# Patient Record
Sex: Male | Born: 1940
Health system: Southern US, Community
[De-identification: ages and names within clinical notes are randomized; demographics above are authoritative.]

## PROBLEM LIST (undated history)

## (undated) DIAGNOSIS — I2510 Atherosclerotic heart disease of native coronary artery without angina pectoris: Secondary | ICD-10-CM

## (undated) DIAGNOSIS — N529 Male erectile dysfunction, unspecified: Secondary | ICD-10-CM

## (undated) DIAGNOSIS — I209 Angina pectoris, unspecified: Secondary | ICD-10-CM

## (undated) DIAGNOSIS — K219 Gastro-esophageal reflux disease without esophagitis: Secondary | ICD-10-CM

## (undated) DIAGNOSIS — Z9889 Other specified postprocedural states: Secondary | ICD-10-CM

## (undated) DIAGNOSIS — E785 Hyperlipidemia, unspecified: Secondary | ICD-10-CM

## (undated) DIAGNOSIS — R0600 Dyspnea, unspecified: Secondary | ICD-10-CM

## (undated) DIAGNOSIS — I10 Essential (primary) hypertension: Secondary | ICD-10-CM

## (undated) DIAGNOSIS — M1990 Unspecified osteoarthritis, unspecified site: Secondary | ICD-10-CM

## (undated) HISTORY — DX: Atherosclerotic heart disease of native coronary artery without angina pectoris: I25.10

## (undated) HISTORY — DX: Gastro-esophageal reflux disease without esophagitis: K21.9

## (undated) HISTORY — DX: Hyperlipidemia, unspecified: E78.5

## (undated) HISTORY — DX: Other specified postprocedural states: Z98.890

## (undated) HISTORY — DX: Male erectile dysfunction, unspecified: N52.9

## (undated) HISTORY — PX: TONSILLECTOMY: SUR1361

## (undated) HISTORY — DX: Unspecified osteoarthritis, unspecified site: M19.90

---

## 1962-07-17 HISTORY — PX: APPENDECTOMY: SHX54

## 1968-07-17 HISTORY — PX: INGUINAL HERNIA REPAIR: SUR1180

## 2001-07-17 HISTORY — PX: RETINAL DETACHMENT SURGERY: SHX105

## 2002-11-25 ENCOUNTER — Ambulatory Visit (HOSPITAL_COMMUNITY): Admission: RE | Admit: 2002-11-25 | Discharge: 2002-11-26 | Payer: Self-pay | Admitting: Ophthalmology

## 2004-06-08 ENCOUNTER — Ambulatory Visit: Payer: Self-pay | Admitting: Family Medicine

## 2004-12-28 ENCOUNTER — Ambulatory Visit: Payer: Self-pay | Admitting: Family Medicine

## 2005-01-02 ENCOUNTER — Ambulatory Visit: Payer: Self-pay | Admitting: Family Medicine

## 2005-01-24 ENCOUNTER — Ambulatory Visit: Payer: Self-pay | Admitting: Family Medicine

## 2005-01-24 LAB — FECAL OCCULT BLOOD, GUAIAC: Fecal Occult Blood: NEGATIVE

## 2006-01-11 ENCOUNTER — Ambulatory Visit: Payer: Self-pay | Admitting: Internal Medicine

## 2006-01-11 LAB — CONVERTED CEMR LAB: PSA: 0.4 ng/mL

## 2006-02-05 ENCOUNTER — Ambulatory Visit: Payer: Self-pay | Admitting: Gastroenterology

## 2006-02-05 LAB — HM COLONOSCOPY

## 2006-02-12 ENCOUNTER — Ambulatory Visit: Payer: Self-pay | Admitting: Gastroenterology

## 2006-05-24 ENCOUNTER — Ambulatory Visit: Payer: Self-pay | Admitting: Internal Medicine

## 2007-02-04 ENCOUNTER — Ambulatory Visit: Payer: Self-pay | Admitting: Internal Medicine

## 2007-03-04 ENCOUNTER — Encounter: Payer: Self-pay | Admitting: Internal Medicine

## 2007-03-04 DIAGNOSIS — J309 Allergic rhinitis, unspecified: Secondary | ICD-10-CM | POA: Insufficient documentation

## 2007-03-04 DIAGNOSIS — E785 Hyperlipidemia, unspecified: Secondary | ICD-10-CM

## 2007-03-15 ENCOUNTER — Ambulatory Visit: Payer: Self-pay | Admitting: Internal Medicine

## 2007-03-15 DIAGNOSIS — M159 Polyosteoarthritis, unspecified: Secondary | ICD-10-CM

## 2007-03-19 LAB — CONVERTED CEMR LAB
ALT: 19 units/L (ref 0–53)
Albumin: 4.1 g/dL (ref 3.5–5.2)
BUN: 20 mg/dL (ref 6–23)
CO2: 30 meq/L (ref 19–32)
Chloride: 103 meq/L (ref 96–112)
Cholesterol: 189 mg/dL (ref 0–200)
Creatinine, Ser: 0.8 mg/dL (ref 0.4–1.5)
GFR calc non Af Amer: 102 mL/min
Phosphorus: 3.2 mg/dL (ref 2.3–4.6)
Total CHOL/HDL Ratio: 4.2
Triglycerides: 108 mg/dL (ref 0–149)

## 2007-09-11 ENCOUNTER — Ambulatory Visit: Payer: Self-pay | Admitting: Internal Medicine

## 2007-09-12 LAB — CONVERTED CEMR LAB
Cholesterol: 267 mg/dL (ref 0–200)
Direct LDL: 190.4 mg/dL
HDL: 45.3 mg/dL (ref >39.0)
Triglycerides: 171 mg/dL — ABNORMAL HIGH (ref 0–149)

## 2008-04-03 ENCOUNTER — Ambulatory Visit: Payer: Self-pay | Admitting: Internal Medicine

## 2008-07-03 ENCOUNTER — Ambulatory Visit: Payer: Self-pay | Admitting: Family Medicine

## 2008-07-24 ENCOUNTER — Telehealth: Payer: Self-pay | Admitting: Internal Medicine

## 2009-04-06 ENCOUNTER — Ambulatory Visit: Payer: Self-pay | Admitting: Internal Medicine

## 2009-04-08 LAB — CONVERTED CEMR LAB
ALT: 24 units/L (ref 0–53)
BUN: 18 mg/dL (ref 6–23)
Basophils Relative: 0.6 % (ref 0.0–3.0)
CO2: 31 meq/L (ref 19–32)
Calcium: 9.5 mg/dL (ref 8.4–10.5)
Chloride: 102 meq/L (ref 96–112)
Cholesterol: 208 mg/dL — ABNORMAL HIGH (ref 0–200)
Eosinophils Absolute: 0.1 10*3/uL (ref 0.0–0.7)
Eosinophils Relative: 1.4 % (ref 0.0–5.0)
HCT: 47.7 % (ref 39.0–52.0)
Lymphs Abs: 2.1 10*3/uL (ref 0.7–4.0)
MCHC: 33.9 g/dL (ref 30.0–36.0)
MCV: 91.5 fL (ref 78.0–100.0)
Monocytes Absolute: 0.6 10*3/uL (ref 0.1–1.0)
PSA: 0.53 ng/mL (ref 0.10–4.00)
RBC: 5.21 M/uL (ref 4.22–5.81)
Sodium: 140 meq/L (ref 135–145)
TSH: 1.15 microintl units/mL (ref 0.35–5.50)
Total Protein: 7 g/dL (ref 6.0–8.3)
Triglycerides: 194 mg/dL — ABNORMAL HIGH (ref 0.0–149.0)
WBC: 6.4 10*3/uL (ref 4.5–10.5)

## 2010-03-17 HISTORY — PX: CARDIAC CATHETERIZATION: SHX172

## 2010-04-06 ENCOUNTER — Encounter: Payer: Self-pay | Admitting: Internal Medicine

## 2010-04-06 ENCOUNTER — Ambulatory Visit: Payer: Self-pay | Admitting: Internal Medicine

## 2010-04-06 DIAGNOSIS — R079 Chest pain, unspecified: Secondary | ICD-10-CM

## 2010-04-06 DIAGNOSIS — R0989 Other specified symptoms and signs involving the circulatory and respiratory systems: Secondary | ICD-10-CM

## 2010-04-06 DIAGNOSIS — R0609 Other forms of dyspnea: Secondary | ICD-10-CM

## 2010-04-06 LAB — CONVERTED CEMR LAB
ALT: 19 units/L (ref 0–53)
AST: 24 units/L (ref 0–37)
Albumin: 4.4 g/dL (ref 3.5–5.2)
BUN: 20 mg/dL (ref 6–23)
Basophils Absolute: 0 10*3/uL (ref 0.0–0.1)
CO2: 29 meq/L (ref 19–32)
Calcium: 9.7 mg/dL (ref 8.4–10.5)
Chloride: 102 meq/L (ref 96–112)
GFR calc non Af Amer: 84.17 mL/min (ref >60)
HCT: 47.8 % (ref 39.0–52.0)
Hemoglobin: 16.4 g/dL (ref 13.0–17.0)
Lymphs Abs: 2.5 10*3/uL (ref 0.7–4.0)
MCHC: 34.4 g/dL (ref 30.0–36.0)
MCV: 90.2 fL (ref 78.0–100.0)
Monocytes Relative: 10.4 % (ref 3.0–12.0)
Neutro Abs: 4.6 10*3/uL (ref 1.4–7.7)
RDW: 13 % (ref 11.5–14.6)
TSH: 1.24 microintl units/mL (ref 0.35–5.50)

## 2010-04-08 ENCOUNTER — Ambulatory Visit: Payer: Self-pay | Admitting: Internal Medicine

## 2010-04-12 ENCOUNTER — Ambulatory Visit: Payer: Self-pay | Admitting: Cardiology

## 2010-04-12 ENCOUNTER — Inpatient Hospital Stay (HOSPITAL_BASED_OUTPATIENT_CLINIC_OR_DEPARTMENT_OTHER): Admission: RE | Admit: 2010-04-12 | Discharge: 2010-04-12 | Payer: Self-pay | Admitting: Cardiology

## 2010-04-19 ENCOUNTER — Ambulatory Visit: Payer: Self-pay | Admitting: Internal Medicine

## 2010-04-19 DIAGNOSIS — I25119 Atherosclerotic heart disease of native coronary artery with unspecified angina pectoris: Secondary | ICD-10-CM

## 2010-04-20 LAB — CONVERTED CEMR LAB
HDL: 48.7 mg/dL (ref >39.00)
PSA: 0.56 ng/mL (ref 0.10–4.00)
Triglycerides: 176 mg/dL — ABNORMAL HIGH (ref 0.0–149.0)

## 2010-05-06 ENCOUNTER — Ambulatory Visit: Payer: Self-pay | Admitting: Internal Medicine

## 2010-05-17 ENCOUNTER — Ambulatory Visit: Payer: Self-pay | Admitting: Internal Medicine

## 2010-05-18 LAB — CONVERTED CEMR LAB
Bilirubin, Direct: 0.1 mg/dL (ref 0.0–0.3)
LDL Cholesterol: 109 mg/dL — ABNORMAL HIGH (ref 0–99)
Total Bilirubin: 1 mg/dL (ref 0.3–1.2)
Total Protein: 6.3 g/dL (ref 6.0–8.3)
VLDL: 28.6 mg/dL (ref 0.0–40.0)

## 2010-05-23 ENCOUNTER — Telehealth: Payer: Self-pay | Admitting: Internal Medicine

## 2010-05-25 ENCOUNTER — Encounter: Payer: Self-pay | Admitting: Internal Medicine

## 2010-06-14 ENCOUNTER — Telehealth: Payer: Self-pay | Admitting: Internal Medicine

## 2010-08-04 ENCOUNTER — Ambulatory Visit
Admission: RE | Admit: 2010-08-04 | Discharge: 2010-08-04 | Payer: Self-pay | Source: Home / Self Care | Attending: Internal Medicine | Admitting: Internal Medicine

## 2010-08-04 ENCOUNTER — Encounter: Payer: Self-pay | Admitting: Internal Medicine

## 2010-08-10 ENCOUNTER — Telehealth: Payer: Self-pay | Admitting: Internal Medicine

## 2010-08-14 LAB — CONVERTED CEMR LAB
AST: 21 units/L (ref 0–37)
Albumin: 4.3 g/dL (ref 3.5–5.2)
Alkaline Phosphatase: 82 units/L (ref 39–117)
BUN: 17 mg/dL (ref 6–23)
Basophils Relative: 0.4 % (ref 0.0–3.0)
Basophils Relative: 0.7 % (ref 0.0–3.0)
Bilirubin, Direct: 0.2 mg/dL (ref 0.0–0.3)
CO2: 27 meq/L (ref 19–32)
Calcium: 9.2 mg/dL (ref 8.4–10.5)
Calcium: 9.5 mg/dL (ref 8.4–10.5)
Chloride: 108 meq/L (ref 96–112)
Creatinine, Ser: 1 mg/dL (ref 0.4–1.5)
Eosinophils Absolute: 0.1 10*3/uL (ref 0.0–0.7)
Eosinophils Relative: 1.2 % (ref 0.0–5.0)
Eosinophils Relative: 1.6 % (ref 0.0–5.0)
GFR calc Af Amer: 96 mL/min
GFR calc non Af Amer: 84.17 mL/min (ref >60)
HDL: 51.2 mg/dL (ref >39.0)
Hemoglobin: 16.4 g/dL (ref 13.0–17.0)
LDL Cholesterol: 91 mg/dL (ref 0–99)
Lymphocytes Relative: 33.2 % (ref 12.0–46.0)
Lymphocytes Relative: 33.5 % (ref 12.0–46.0)
Monocytes Relative: 10.5 % (ref 3.0–12.0)
Monocytes Relative: 10.8 % (ref 3.0–12.0)
Neutro Abs: 4 10*3/uL (ref 1.4–7.7)
Neutrophils Relative %: 54.3 % (ref 43.0–77.0)
Platelets: 232 10*3/uL (ref 150–400)
RBC: 5.17 M/uL (ref 4.22–5.81)
RBC: 5.25 M/uL (ref 4.22–5.81)
Sodium: 137 meq/L (ref 135–145)
Total CHOL/HDL Ratio: 3.3
Total Protein: 7.1 g/dL (ref 6.0–8.3)
VLDL: 25 mg/dL (ref 0–40)
WBC: 7.3 10*3/uL (ref 4.5–10.5)
aPTT: 27.7 s (ref 21.7–28.8)

## 2010-08-15 ENCOUNTER — Telehealth: Payer: Self-pay | Admitting: Internal Medicine

## 2010-08-16 NOTE — Letter (Signed)
Summary: Cardiac Catheterization Instructions- [facility]  [employer], Main Office  [address]   [place], [place] [address]   Phone: [phone]  Fax: [phone]     04/08/2010 MRN: [id number]  [facility] [address] [place], [place]  [address]  Dear Mr. Rayman,   You are scheduled for a Cardiac Catheterization on _9/27/2011________ with Dr.__McAlhany____________  Please arrive to the 1st floor of the [facility] at [facility] at __730___ am  on the day of your procedure. Please do not arrive before 6:30 a.m. Call the Heart and Vascular Center at [phone] if you are unable to make your appointmnet. The Code to get into the parking garage under the building is__0200______. Take the elevators to the 1st floor. You must have someone to drive you home. Someone must be with you for the first 24 hours after you arrive home. Please wear clothes that are easy to get on and off and wear slip-on shoes. Do not eat or drink after midnight except water with your medications that morning. Bring all your medications and current insurance cards with you.  ___ DO NOT take these medications before your procedure: ________________________________________________________________  ___ Make sure you take your aspirin.  __x_ You may take ALL of your medications with water that morning. ________________________________________________________________________________________________________________________________    ___________________________________________________________________________________________________  The usual length of stay after your procedure is 2 to 3 hours. This can vary.  If you have any questions, please call the office at the number listed above.   Layne Benton, RN, BSN

## 2010-08-16 NOTE — Miscellaneous (Signed)
  Clinical Lists Changes  Medications: Changed medication from NORVASC 2.5 MG TABS (AMLODIPINE BESYLATE) take one tablet by mouth daily to NORVASC 2.5 MG TABS (AMLODIPINE BESYLATE) take two  tablets  by mouth daily

## 2010-08-16 NOTE — Progress Notes (Signed)
Summary: calling about medication  Phone Note Call from Patient Call back at Home Phone [phone]   Caller: Patient Summary of Call: Pt calling about medication Initial call taken by: Judie Grieve,  June 14, 2010 2:21 PM  Follow-up for Phone Call        Called patient and he has advised me that he has less chest pain with exertion since increasing Norvasc to 5mg  every day. I advised him that I will let Dr. know that he is feeling better. He was instructed to call us back if symptoms change.  Layne Benton, RN, BSN  June 14, 2010 3:18 PM

## 2010-08-16 NOTE — Assessment & Plan Note (Signed)
Summary: new onset of classic excertinal angina/mt   Visit Type:  Initial Consult Primary :  Dr. Clemmie Krill  CC:  chest pain.  History of Present Illness: Patient is a 70 year old who is followed by R. Alphonsus Sias.  He was seen in clinic a couple days ago.  Relayed about a 3 wk history of chest tightnes/SOB with exertion (hills, elliptical)  Episodes went away with rest.  No tightness at rest.  No PND.  Current Medications (verified): 1)  Lovastatin 40 Mg  Tabs (Lovastatin) .... Take 1 Tablet By Mouth Once A Day  Allergies: 1)  ! Motrin Ib (Ibuprofen)  Past History:  Past Surgical History: Last updated: 04/07/2010 Appendectomy1964 Double hernia repair 1970 Carotid U/S wnl 11 /99 Colonoscopy 07/05/82 Right detached retina  ~2003  Family History: Last updated: 04-13-10 Father: Died at age 34, stomach cancer Mother: Died at the age of 44, after 4 bypasses and brain cancer that was metastatic from the lung Siblings: One sister still alive with diabetes CAD + mother (CABG in 53s) DM + sister Cancer + mother, father, GP Stroke + MGM  Social History: Last updated: 04/07/2010 Marital Status: Married Children: 2 Occupation: Retired - Teacher, English as a foreign language cigar--no cigarettes---stopped  ~6/09 Occ beer  Past Medical History: Chest pain Allergic rhinitis GERD Hyperlipidemia Duodenal ulcer Erectile dysfunction Osteoarthritis  Family History: Father: Died at age 79, stomach cancer Mother: Died at the age of 4, after 4 bypasses and brain cancer that was metastatic from the lung Siblings: One sister still alive with diabetes CAD + mother (CABG in 24s) DM + sister Cancer + mother, father, GP Stroke + MGM  Review of Systems       All systemrs reviewed.  Neg to above.  Vital Signs:  Patient profile:   70 year old male Height:      66.75 inches Weight:      185 pounds BMI:     29.30 Pulse rate:   60 / minute Pulse rhythm:   regular Resp:     18 per minute BP  sitting:   146 / 88  (left arm) Cuff size:   large  Vitals Entered By: Vikki Ports (04-13-2010 12:17 PM)  Physical Exam  Additional Exam:  Patient is in NAD HEENT:  Normocephalic, atraumatic. EOMI, PERRLA.  Neck: JVP is normal. No thyromegaly. No bruits.  Lungs: clear to auscultation. No rales no wheezes.  Heart: Regular rate and rhythm. Normal S1, S2. No S3.   No significant murmurs. PMI not displaced.  Abdomen:  Supple, nontender. Normal bowel sounds. No masses. No hepatomegaly.  Extremities:   Good distal pulses throughout. No lower extremity edema.  Musculoskeletal :moving all extremities.  Neuro:   alert and oriented x3.    EKG  Procedure date:  04/06/2010  Findings:      NSR.  57 bpm.  Impression & Recommendations:  Problem # 1:  CHEST PAIN (ICD-786.50)  History is concerning for angina.  Discussed with patient and wife.  With this I would recomm a LHC to define anatomy.  Continue on asa.  NTG as needed.  Labs today .  Plan for next wk.  No exercise or signif exertion prior.  Call if worsens,   would admit then.  His updated medication list for this problem includes:    Nitrostat 0.4 Mg Subl (Nitroglycerin) .Marland Kitchen... Take as directed  Problem # 2:  HYPERLIPIDEMIA (ICD-272.4)  ON statin  Will need t o be followed. His updated medication  list for this problem includes:    Lovastatin 40 Mg Tabs (Lovastatin) .Marland Kitchen... Take 1 tablet by mouth once a day  His updated medication list for this problem includes:    Lovastatin 40 Mg Tabs (Lovastatin) .Marland Kitchen... Take 1 tablet by mouth once a day  Other Orders: EKG w/ Interpretation (93000) TLB-BMP (Basic Metabolic Panel-BMET) (80048-METABOL) TLB-CBC Platelet - w/Differential (85025-CBCD) TLB-PT (Protime) (85610-PTP) TLB-PTT (85730-PTTL) Cardiac Catheterization (Cardiac Cath)  Patient Instructions: 1)  Your physician recommends that you return for lab work in:lab work today  2)  Your physician has requested that you have a  cardiac catheterization.  Cardiac catheterization is used to diagnose and/or treat various heart conditions. Doctors may recommend this procedure for a number of different reasons. The most common reason is to evaluate chest pain. Chest pain can be a symptom of coronary artery disease (CAD), and cardiac catheterization can show whether plaque is narrowing or blocking your heart's arteries. This procedure is also used to evaluate the valves, as well as measure the blood flow and oxygen levels in different parts of your heart.  For further information please visit [email or web address]  Please follow instruction sheet, as given. Prescriptions: NITROSTAT 0.4 MG SUBL (NITROGLYCERIN) take as directed  #25 x 1   Entered by:   Layne Benton, RN, BSN   Authorized by:   Sherrill Raring, MD, [facility]   Signed by:   Layne Benton, RN, BSN on 04/08/2010   Method used:   Electronically to        [facility]* (retail)       [address]       [place], [place]  [address]       Ph: [phone]       Fax: [phone]   RxID:   [id number]

## 2010-08-16 NOTE — Assessment & Plan Note (Signed)
Summary: CPX / LFW   Vital Signs:  Patient profile:   70 year old male Height:      66 inches Weight:      186 pounds Temp:     97.6 degrees F oral Pulse rate:   60 / minute Pulse rhythm:   regular BP sitting:   140 / 78  (left arm) Cuff size:   large  Vitals Entered By: Mervin Hack CMA Duncan Dull) (April 19, 2010 8:29 AM) CC: adult physical   History of Present Illness: Reviewed his recent cath---minor diffuse blockages Has again noted some dyspnea [place] [facility] [place] [employer]  Interested in shingles shot got flu shot today  No other new concerns  Allergies: 1)  ! Motrin Ib (Ibuprofen)  Past History:  Past medical, surgical, family and social histories (including risk factors) reviewed for relevance to current acute and chronic problems.  Past Medical History: Chest pain Allergic rhinitis GERD Hyperlipidemia Duodenal ulcer Erectile dysfunction Osteoarthritis Coronary artery disease  Past Surgical History: Appendectomy1964 Double hernia repair 1970 Carotid U/S wnl 11 /99 Colonoscopy 07/05/82 Right detached retina  ~2003 Cath 9/11   diffuse early disease  Family History: Reviewed history from 04/08/2010 and no changes required. Father: Died at age 66, stomach cancer Mother: Died at the age of 27, after 4 bypasses and brain cancer that was metastatic from the lung Siblings: One sister still alive with diabetes CAD + mother (CABG in 30s) DM + sister Cancer + mother, father, GP Stroke + MGM  Social History: Reviewed history from 04/07/2010 and no changes required. 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 Eyes:  Complains of vision loss-1 eye; denies double vision; Having some trouble  with vision Got new glasses but no improvement chronic vision loss in left eye (amblyopia). ENT:  Complains of ringing in ears; denies decreased hearing; rare tinnitus Full dentures. CV:  Complains of chest pain or discomfort, palpitations, and shortness of breath with exertion; denies difficulty breathing at night, difficulty breathing while lying down, fainting, and lightheadness; still with chest sensation, fluttering and dyspnea. Resp:  Denies cough, sputum productive, and wheezing. GI:  Denies abdominal pain, bloody stools, change in bowel habits, dark tarry stools, nausea, and vomiting; occ epigastric pain--?from spicy food. No meds. GU:  Denies urinary frequency and urinary hesitancy; no sex--no problem. MS:  Complains of joint pain; denies joint swelling; slight pain in left foot--usually at rest. No history of gout. Derm:  Denies lesion(s) and rash. Neuro:  Complains of headaches; denies numbness, tingling, and weakness; occ headaches---aleve relieves. Psych:  Denies anxiety and depression. Heme:  Denies abnormal bruising and enlarge lymph nodes. Allergy:  Denies seasonal allergies and sneezing.  Physical Exam  General:  alert and normal appearance.   Eyes:  pupils equal, pupils round, pupils reactive to light, and no optic disk abnormalities.   Ears:  R ear normal and L ear normal.   Mouth:  no erythema, no exudates, and no lesions.   Full upper and lower plates Neck:  supple, no masses, no thyromegaly, no carotid bruits, and no cervical lymphadenopathy.   Lungs:  normal respiratory effort, no intercostal retractions, no accessory muscle use, and normal breath sounds.   Heart:  normal rate, regular rhythm, no murmur,  and no gallop.  HR  ~60 Abdomen:  soft, non-tender, and no masses.   Msk:  no joint tenderness and no joint swelling.   Pulses:  1+ in feet Extremities:  no edema Neurologic:  alert & oriented X3, strength normal in all extremities, and gait normal.   Skin:  no  rashes and no suspicious lesions.   Axillary Nodes:  No palpable lymphadenopathy Psych:  normally interactive, good eye contact, not anxious appearing, and not depressed appearing.     Impression & Recommendations:  Problem # 1:  HEALTH MAINTENANCE EXAM (ICD-V70.0) Assessment Comment Only fairly active Rx for zostavax given will do PSA after discussion  Problem # 2:  CORONARY ARTERY DISEASE (ICD-414.00) Assessment: New diffuse, non obstructive disease still with a now stable anginal pattern BP okay without meds---- beta blocker probably could help but already HR only 60 ?long acting nitrate ?CCB He is due to see Dr Tenny Craw again  His updated medication list for this problem includes:    Nitrostat 0.4 Mg Subl (Nitroglycerin) .Marland Kitchen... Take as directed    Aspirin 81 Mg Tabs (Aspirin) .Marland Kitchen... 1 tab daily  Problem # 3:  HYPERLIPIDEMIA (ICD-272.4) Assessment: Comment Only  will change Rx if LDL not under 100  His updated medication list for this problem includes:    Lovastatin 40 Mg Tabs (Lovastatin) .Marland Kitchen... Take 1 tablet by mouth once a day  Labs Reviewed: SGOT: 24 (04/06/2010)   SGPT: 19 (04/06/2010)   HDL:49.40 (04/06/2009), 51.2 (04/03/2008)  LDL:91 (04/03/2008), DEL (04/54/0981)  Chol:208 (04/06/2009), 167 (04/03/2008)  Trig:194.0 (04/06/2009), 123 (04/03/2008)  Orders: TLB-Lipid Panel (80061-LIPID) Venipuncture (19147)  Complete Medication List: 1)  Lovastatin 40 Mg Tabs (Lovastatin) .... Take 1 tablet by mouth once a day 2)  Nitrostat 0.4 Mg Subl (Nitroglycerin) .... Take as directed 3)  Aspirin 81 Mg Tabs (Aspirin) .Marland Kitchen.. 1 tab daily  Other Orders: Flu Vaccine 64yrs + MEDICARE PATIENTS (W2956) Administration Flu vaccine - MCR (G0008) TLB-PSA (Prostate Specific Antigen) (84153-PSA)  Patient Instructions: 1)  Please schedule a follow-up appointment in 6 months .   Current Allergies (reviewed today): ! MOTRIN IB (IBUPROFEN)   Influenza Vaccine    Vaccine Type: Fluvax  MCR    Site: left deltoid    Mfr: [employer] P[employer]    Dose: 0.5 ml    Route: IM    Given by: Mervin Hack CMA (AAMA)    Exp. Date: 01/14/2011    Lot #: [id number]    VIS given: 02/08/10 version given April 19, 2010.  Flu Vaccine Consent Questions    Do you have a history of severe allergic reactions to this vaccine? no    Any prior history of allergic reactions to egg and/or gelatin? no    Do you have a sensitivity to the preservative Thimersol? no    Do you have a past history of Guillan-Barre Syndrome? no    Do you currently have an acute febrile illness? no    Have you ever had a severe reaction to latex? no    Vaccine information given and explained to patient? yes

## 2010-08-16 NOTE — Progress Notes (Signed)
Summary: re update on condition  Phone Note Call from Patient Call back at [phone]   Caller: Patient [phone] Reason for Call: Talk to Nurse Summary of Call: PT CALLING WITH UPDATE ON CONDITION Initial call taken by: Glynda Jaeger,  May 23, 2010 12:16 PM  Follow-up for Phone Call        Patient advised me that he has been taking Norvasc 2.5 mg and does not see any change in his status. He states that he still has chest pain with exertion but the pain is relieved with rest. I advised him that I would discuss the above with Dr.Ross and call him back. I advised him that she would probably either increase the  Norvasc medication or start a new medication. He states that he hopes he stays on Norvasc because he purchased a 3 month supply. Will follow up after Dr.Ross reviews.   Layne Benton, RN, BSN  May 23, 2010 2:13 PM     Additional Follow-up for Phone Call Additional follow up Details #1::        He can increase to 5 mg per day.  If too much can have some dizziness.  Follow how feels. (take 2 2.5 mg per day).  Call in a couple wks. Additional Follow-up by: Sherrill Raring, MD, [facility],  May 23, 2010 9:15 PM     Appended Document: re update on condition Pt.given instructions from Dr. Tenny Craw. He verbalizes understanding of all instructions.

## 2010-08-16 NOTE — Assessment & Plan Note (Signed)
Summary: SHORTNESS OF BREATH WITH EXERTION   Vital Signs:  Patient profile:   70 year old male Weight:      188 pounds BMI:     29.77 Temp:     97.8 degrees F oral Pulse rate:   58 / minute Pulse rhythm:   regular BP sitting:   160 / 80  (left arm) Cuff size:   large  Vitals Entered By: Mervin Hack CMA Duncan Dull) (April 06, 2010 9:19 AM) CC: shortness of breath   History of Present Illness: Has been walking dog in the morning Going up a grade he is getting tightness in chest and has had to stop to get his breath then he can go on started a few weeks ago  Continues to work out at [employer] doing [employer] and other [employer] got tight in his chest and heaviness in arms with elliptical and had to stop  Sleeps poorly. In and out of sleep --no sig change though No orthopnea or PND No edema  Allergies: 1)  ! Motrin Ib (Ibuprofen)  Past History:  Past medical, surgical, family and social histories (including risk factors) reviewed for relevance to current acute and chronic problems.  Past Medical History: Reviewed history from 03/15/2007 and no changes required. Allergic rhinitis GERD Hyperlipidemia Duodenal ulcer Erectile dysfunction Osteoarthritis  Past Surgical History: Reviewed history from 03/04/2007 and no changes required. [id number] Double hernia repair 1970 Carotid U/S wnl 11 /99 Colonoscopy 07/05/82 Right detached retina  ~2003  Family History: Reviewed history from 03/04/2007 and no changes required. Father: Died at age 63, stomach cancer Mother: Died at the age of 44, after 4 bypasses and brain cancer that was metastatic from the lung Siblings: One sister still alive with diabetes CAD + mother DM + sister Cancer + mother, father, GP Stroke + MGM  Social History: Reviewed history from 04/03/2008 and no changes required. 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   Neck:  supple, no masses, no thyromegaly, no carotid bruits, and no cervical lymphadenopathy.   Lungs:  normal respiratory effort, no intercostal retractions, no accessory muscle use, and normal breath sounds.   Heart:  normal rate, regular rhythm, no murmur, no gallop, and no rub.   Abdomen:  soft, non-tender, and no masses.   Pulses:  1+ in feet Extremities:  no edema Psych:  normally interactive, good eye contact, not depressed appearing, and slightly anxious.     Impression & Recommendations:  Problem # 1:  CHEST PAIN (ICD-786.50) Assessment New classic story of exertional angina may be appropriate to proceed right to cath----will set up appt with cardiology start ASA take it easy for now  Orders: EKG w/ Interpretation (93000) TLB-Renal Function Panel (80069-RENAL) TLB-CBC Platelet - w/Differential (85025-CBCD) TLB-Hepatic/Liver Function Pnl (80076-HEPATIC) TLB-TSH (Thyroid Stimulating Hormone) (84443-TSH) Venipuncture (86578) Cardiology Referral (Cardiology)  Problem # 2:  DYSPNEA/SHORTNESS OF BREATH (ICD-786.09) Assessment: Comment Only has lung disease on the spirometry but not enough to account for his symptoms no longer smokes cigars CXR benign--?mild increased interstitial markings  Orders: Spirometry w/Graph (94010) T-2 View CXR (71020TC)  Complete Medication List: 1)  Lovastatin 40 Mg Tabs (Lovastatin) .... Take 1 tablet by mouth once a day  Patient Instructions: 1)  Please plan to keep physical appt 2)  Please start coated aspirin 81mg  daily. Take your first dose today 3)  Referral Appointment Information 4)  Day/Date: 5)  Time: 6)  Place/MD: 7)  Address: 8)  Phone/Fax: 9)  Patient given appointment information. Information/Orders faxed/mailed.  Current Allergies (reviewed today): ! MOTRIN IB (IBUPROFEN)

## 2010-08-16 NOTE — Cardiovascular Report (Signed)
Summary: Pre Cath Orders   Pre Cath Orders   Imported By: Roderic Ovens 04/14/2010 16:21:49  _____________________________________________________________________  External Attachment:    Type:   Image     Comment:   External Document

## 2010-08-16 NOTE — Assessment & Plan Note (Signed)
Summary: eph/ post cath.g d   Visit Type:  Post-hospital Primary Provider:  Dr. Clemmie Krill  CC:  Sob.  History of Present Illness: Jonathan Griffith is a 70 year old who is followed by R. Alphonsus Sias. I saw him in clinic in Sept for SOB and chest tightness, worrisome for ischemia.  The Jonathan Griffith was set up for R/L heart cath.  R heart cath showed normal pressuress.  L heart catho showed 30 to 40% LAD prox; 50% distal LAD.  RCA and CX were without signif narrowings.  Of note the vessels were noted to be small caliber.  It was felt that they may be diffusely diseased but without discrete blockages.  Possible microvasc disease..  Since seen he continues to get chest pressure with walking.  No real change.  Current Medications (verified): 1)  Nitrostat 0.4 Mg Subl (Nitroglycerin) .... Take As Directed 2)  Aspirin 81 Mg Tabs (Aspirin) .Marland Kitchen.. 1 Tab Daily 3)  Pravastatin Sodium 40 Mg Tabs (Pravastatin Sodium) .... Take 1 By Mouth Once Daily  Allergies: 1)  ! Motrin Ib (Ibuprofen)  Past History:  Past medical, surgical, family and social histories (including risk factors) reviewed, and no changes noted (except as noted below).  Past Medical History: Reviewed history from 04/19/2010 and no changes required. Chest pain Allergic rhinitis GERD Hyperlipidemia Duodenal ulcer Erectile dysfunction Osteoarthritis Coronary artery disease  Past Surgical History: Reviewed history from 04/19/2010 and no changes required. [id number] Double hernia repair 1970 Carotid U/S wnl 11 /99 Colonoscopy 07/05/82 Right detached retina  ~2003 Cath 9/11   diffuse early disease  Family History: Reviewed history from 04/08/2010 and no changes required. Father: Died at age 22, stomach cancer Mother: Died at the age of 12, after 4 bypasses and brain cancer that was metastatic from the lung Siblings: One sister still alive with diabetes CAD + mother (CABG in 50s) DM + sister Cancer + mother, father, GP Stroke +  MGM  Social History: Reviewed history from 04/07/2010 and no changes required. 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 HEENT:  Normocephalic, atraumatic. EOMI, PERRLA.  Neck: JVP is normal. No thyromegaly. No bruits.  Lungs: clear to auscultation. No rales no wheezes.  Heart: Regular rate and rhythm. Normal S1, S2. No S3.   No significant murmurs. PMI not displaced.  Abdomen:  Supple, nontender. Normal bowel sounds. No masses. No hepatomegaly.  Extremities:   Good distal pulses throughout. No lower extremity edema.  Musculoskeletal :moving all extremities.  Neuro:   alert and oriented x3.    EKG  Procedure date:  05/06/2010  Findings:      Sinus bradycardia.  57 bpm.  First degree AV block.  PR 248 msec.  Impression & Recommendations:  Problem # 1:  CORONARY ARTERY DISEASE (ICD-414.00) No severe discrete narrowings on recent cath.  Cath is suspicious for diffuse disease.  Also question microvascular disease. I would recommend a trial of Norvasc  2.5 mg.  F/U in clinic.  Problem # 2:  HYPERLIPIDEMIA (ICD-272.4) will need to follow. His updated medication list for this problem includes:    Pravastatin Sodium 40 Mg Tabs (Pravastatin sodium) .Marland Kitchen... Take 1 by mouth once daily  Other Orders: EKG w/ Interpretation (93000)  Jonathan Griffith Instructions: 1)  Your physician recommends that you schedule a follow-up appointment in: 3 mopnths. 2)  Your physician has recommended you make the following change in your medication: Norvasc 2.5 mg  once a day. 3)  Pt. will call Annice Pih in 2 weeks to let her know how he feels taken the new medication. Prescriptions: NORVASC 2.5 MG TABS (AMLODIPINE BESYLATE) take one tablet by mouth daily  #90 x 3   Entered by:   Ollen Gross, RN, BSN   Authorized by:   Sherrill Raring, MD, [facility]   Signed by:   Ollen Gross, RN, BSN on 05/06/2010   Method used:   Electronically to        [facility] [phone]* (retail)       [address]       [place], [place]  [address]       Ph: [phone]       Fax: [phone]   RxID:   [id number] NORVASC 2.5 MG TABS (AMLODIPINE BESYLATE) tahke one tablet by mouth daily  #30 x 6   Entered by:   Ollen Gross, RN, BSN   Authorized by:   Sherrill Raring, MD, [facility]   Signed by:   Ollen Gross, RN, BSN on 05/06/2010   Method used:   Electronically to        [facility] [phone]* (retail)       [address]       [place], [place]  [address]       Ph: [phone]       Fax: [phone]   RxID:   [id number]

## 2010-08-18 NOTE — Progress Notes (Addendum)
Summary: pt requests phone call  Phone Note Call from Patient Call back at [phone]   Caller: Patient Call For: Cindee Salt MD Summary of Call: Pt is not happy with Dr. Tenny Craw.  He says she is a part time doctor and difficult to get in touch with.  She put him on imdur and this is giving him headaches.  He doesnt want to take it, but he cant get in touch with Dr. Tenny Craw or her nurse for them to advise what he should do.  He is asking that you call him to discuss. Initial call taken by: Lowella Petties CMA, AAMA,  August 10, 2010 8:25 AM  Follow-up for Phone Call        still with exertional chest pain Got amlodipine --didn't seem to help  tried imdur----not clearly helpful but really had bad persistent headache  Despite low HR, I will try low dose bisoprolol for angina Advised to watch for dizziness or fatigue  I will follow up at his 3/4 visit unless he can't tolerate the med Follow-up by: Cindee Salt MD,  August 10, 2010 2:14 PM    New/Updated Medications: BISOPROLOL FUMARATE 5 MG TABS (BISOPROLOL FUMARATE) 1/2 tab daily for exertional chest pain Prescriptions: BISOPROLOL FUMARATE 5 MG TABS (BISOPROLOL FUMARATE) 1/2 tab daily for exertional chest pain  #30 x 3   Entered and Authorized by:   Cindee Salt MD   Signed by:   Cindee Salt MD on 08/10/2010   Method used:   Electronically to        [facility]* (retail)       [address]       [place], [place]  [address]       Ph: [phone]       Fax: [phone]   RxID:   [id number]   Appended Document: pt requests phone call Reviewed.  Left msg on phone machine that I would call back today/tomorrow.  Appended Document: pt requests phone call Left msg yesterday.  Left msg today/  I told him to call back.  I will be in clinic on Monday.

## 2010-08-18 NOTE — Assessment & Plan Note (Signed)
Summary: 3 month rov/sl   Primary Provider:  Dr. Clemmie Krill  CC:  sob.Marland KitchenMarland Kitchenpt has not taken  norvasc for about a month now .  History of Present Illness: Patient is a 70 year old with a history of CP and SOB.  I saw him last fall for evaluation  R heart cath showed normal pulm pressuress.  L heart cath showed mild disease.  The vessels did appear small though, suspicous for diffuse atherosclerosis.  I saw the patient in clinic after the procedure.  I prescribed 2.5 Norvasc to see if it would provide any vasodilation   The patient did not notice any difference with this.  He ran out and did not have it refilled.   HE still gets chest tightness with activity (walking on treadmll, biking, walking up hill).  Relieved with rest or slowing down.  No PND.  Current Medications (verified): 1)  Nitrostat 0.4 Mg Subl (Nitroglycerin) .... Take As Directed 2)  Aspirin 81 Mg Tabs (Aspirin) .Marland Kitchen.. 1 Tab Daily 3)  Pravastatin Sodium 40 Mg Tabs (Pravastatin Sodium) .... Take 1 By Mouth Once Daily  Allergies: 1)  ! 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  Past Medical History: Reviewed history from 04/19/2010 and no changes required. Chest pain Allergic rhinitis GERD Hyperlipidemia Duodenal ulcer Erectile dysfunction Osteoarthritis Coronary artery disease  Past Surgical History: Reviewed history  from 04/19/2010 and no changes required. [id number] Double hernia repair 1970 Carotid U/S wnl 11 /99 Colonoscopy 07/05/82 Right detached retina  ~2003 Cath 9/11   diffuse early disease  Family History: Reviewed history from Apr 10, 2010 and no changes required. Father: Died at age 28, stomach cancer Mother: Died at the age of 69, after 4 bypasses and brain cancer that was metastatic from the lung Siblings: One sister still alive with diabetes CAD + mother (CABG in 95s) DM + sister Cancer + mother, father, GP Stroke + MGM  Social History: Reviewed history from 04/07/2010 and no changes required. 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  Neg to the above problem except as noted above  Vital Signs:  Patient profile:   70 year old male Height:      66 inches Weight:      193 pounds BMI:     31.26 Pulse rate:   55 / minute Resp:     16 per minute BP sitting:   147 / 79  (left arm)  Vitals Entered By: Kem Parkinson (August 04, 2010 8:24 AM)  Physical Exam  Additional Exam:  Patient is in NAD HEENT:  Normocephalic, atraumatic. EOMI, PERRLA.  Neck: JVP is normal. No thyromegaly. No bruits.  Lungs: clear to auscultation. No rales no wheezes.  Heart: Regular rate and rhythm. Normal S1, S2. No S3.   No significant murmurs. PMI not displaced.  Abdomen:  Supple, nontender. Normal bowel sounds. No masses. No hepatomegaly.  Extremities:   Good distal pulses throughout. No lower extremity edema.  Musculoskeletal :moving all extremities.  Neuro:   alert and oriented x3.    EKG  Procedure date:  08/04/2010  Findings:      NSR.  74 bpm.  First degree AV block.    Impression & Recommendations:  Problem # 1:  CORONARY ARTERY DISEASE (ICD-414.00) patient still with symptoms.   I would recomm a trial of Imdur  (first 30 then 60 mg per day.)  Call in a  few wks to see how he is doing. The following medications were removed from the medication list:    Norvasc 2.5 Mg Tabs (Amlodipine besylate) .Marland Kitchen... Take two  tablets  by mouth daily His updated medication list for this problem includes:    Nitrostat 0.4 Mg Subl (Nitroglycerin) .Marland Kitchen... Take as directed    Aspirin 81 Mg Tabs (Aspirin) .Marland Kitchen... 1 tab daily    Isosorbide Mononitrate Cr 60 Mg Xr24h-tab (Isosorbide mononitrate) .Marland Kitchen... 1 tab every day  Orders: EKG w/ Interpretation (93000)  Problem # 2:  HYPERLIPIDEMIA (ICD-272.4) Patient's last LDL was 109 .  Now on pravastatin 40  He is due to have fasting lipid panel in March.  I will review results.  With cath findings suggest aggressive control of LDL (70s) His updated medication list for this problem includes:    Pravastatin Sodium 40 Mg Tabs (Pravastatin sodium) .Marland Kitchen... Take 1 by mouth once daily  Patient Instructions: 1)  Your physician has recommended you make the following change in your medication: new medication Imdur. Take one half a tab for 3 days then 1 tab every day  2)  Your physician wants you to follow-up in:  August 2012  You will receive a reminder letter in the mail two months in advance. If you don't receive a letter, please call our office to schedule the follow-up appointment. Prescriptions: ISOSORBIDE MONONITRATE CR 60 MG XR24H-TAB (ISOSORBIDE MONONITRATE) 1 tab every day  #32 x 6   Entered by:   Layne Benton, RN, BSN   Authorized by:   Sherrill Raring, MD, [facility]   Signed by:   Layne Benton, RN, BSN on 08/04/2010   Method used:   Electronically to        [facility] [phone]* (retail)       [address]       [place], [place]  [address]       Ph: [phone]       Fax: [phone]   RxID:   [id number]

## 2010-08-27 ENCOUNTER — Encounter: Payer: Self-pay | Admitting: Internal Medicine

## 2010-09-01 NOTE — Progress Notes (Addendum)
Summary: rtn call to dr Tenny Craw  Phone Note Call from Patient   Caller: Patient [phone] Reason for Call: Talk to Nurse, Talk to Doctor Summary of Call: pt rtn call to dr Tenny Craw Initial call taken by: Glynda Jaeger,  August 15, 2010 8:13 AM  Follow-up for Phone Call        Called patient back. He advised me that he could not tolerate the Imdur because of severe headaches not helped by Aleve. His primary care doctor started him on Bisoprolol 2.5 mg every day. He states that he is tolerating the medication and thinks that it is helping. He feels that he has less chest pain with ambulation. Advised will let Dr. know about this update.  Layne Benton, RN, BSN  August 17, 2010 6:48 PM   Additional Follow-up for Phone Call Additional follow up Details #1::        aware of response. Need to make sure patient has f/u in cardiology this summer.  Can be with other cardiologist if he would prefer.  Primary is R. Letvak. Additional Follow-up by: Sherrill Raring, MD, [facility],  August 25, 2010 10:39 AM     Appended Document: rtn call to dr Tenny Craw He is in call backs for August 2012 for cardiology follow up.

## 2010-09-29 LAB — POCT I-STAT 3, VENOUS BLOOD GAS (G3P V)
O2 Saturation: 72 %
pCO2, Ven: 47.5 mmHg (ref 45.0–50.0)
pH, Ven: 7.368 — ABNORMAL HIGH (ref 7.250–7.300)

## 2010-09-29 LAB — POCT I-STAT 3, ART BLOOD GAS (G3+)
Bicarbonate: 27.9 mEq/L — ABNORMAL HIGH (ref 20.0–24.0)
pCO2 arterial: 41.6 mmHg (ref 35.0–45.0)
pH, Arterial: 7.435 (ref 7.350–7.450)
pO2, Arterial: 75 mmHg — ABNORMAL LOW (ref 80.0–100.0)

## 2010-10-19 ENCOUNTER — Encounter: Payer: Self-pay | Admitting: Internal Medicine

## 2010-10-19 ENCOUNTER — Ambulatory Visit (INDEPENDENT_AMBULATORY_CARE_PROVIDER_SITE_OTHER): Payer: MEDICARE | Admitting: Internal Medicine

## 2010-10-19 VITALS — BP 138/80 | HR 56 | Temp 97.6°F | Ht 66.0 in | Wt 187.0 lb

## 2010-10-19 DIAGNOSIS — E785 Hyperlipidemia, unspecified: Secondary | ICD-10-CM

## 2010-10-19 DIAGNOSIS — I2510 Atherosclerotic heart disease of native coronary artery without angina pectoris: Secondary | ICD-10-CM

## 2010-10-19 DIAGNOSIS — M1990 Unspecified osteoarthritis, unspecified site: Secondary | ICD-10-CM

## 2010-10-19 LAB — HEPATIC FUNCTION PANEL
ALT: 19 U/L (ref 0–53)
Alkaline Phosphatase: 78 U/L (ref 39–117)
Bilirubin, Direct: 0.2 mg/dL (ref 0.0–0.3)
Total Protein: 6.6 g/dL (ref 6.0–8.3)

## 2010-10-19 LAB — BASIC METABOLIC PANEL
CO2: 30 mEq/L (ref 19–32)
Chloride: 102 mEq/L (ref 96–112)
Creatinine, Ser: 0.9 mg/dL (ref 0.4–1.5)
Glucose, Bld: 91 mg/dL (ref 70–99)

## 2010-10-19 LAB — LIPID PANEL
Total CHOL/HDL Ratio: 4
Triglycerides: 141 mg/dL (ref 0.0–149.0)

## 2010-10-19 MED ORDER — BISOPROLOL FUMARATE 5 MG PO TABS: 2.5000 mg | ORAL_TABLET | Freq: Every day | ORAL | Status: DC

## 2010-10-19 NOTE — Progress Notes (Signed)
  Subjective:    Patient ID: Jonathan Griffith, male    DOB: 02/14/1941, 70 y.o.   MRN: [id number]  HPI Feels better Has been tolerating the bisoprolol in very low dose Exercise tolerance has improved Able to walk up the hill with dog easier----still gets some tightness if he pushes it Regular at Y--may have some tightness if he pushes on the treadmill No problems on the bicycle Also does yard work daily  No myalgias on statin Did have brief pain in right leg---resolved after a couple of day No stomach problems  Some hand pain Uses aleve a couple of times a month---helps  Past Medical History  Diagnosis Date  . Chest pain   . Allergic rhinitis   . GERD (gastroesophageal reflux disease)   . Hyperlipidemia   . Duodenal ulcer   . ED (erectile dysfunction)   . Osteoarthritis   . CAD (coronary artery disease)     Past Surgical History  Procedure Date  . Appendectomy 1964  . Hernia repair 1970    double hernia repair   . Retinal detachment surgery 2003    Family History  Problem Relation Age of Onset  . Cancer Mother     brain cancer  that was metastatic  from lung   . Coronary artery disease Mother     CABG in 21's  . Cancer Father     stomach cancer- 79  . Diabetes Sister   . Stroke Maternal Grandmother     History   Social History  . Marital Status: Married    Spouse Name: N/A    Number of Children: N/A  . Years of Education: N/A   Occupational History  . Not on file.   Social History Main Topics  . Smoking status: Former Smoker    Types: Cigars    Quit date: 07/18/2007  . Smokeless tobacco: Never Used   Comment: Occ cigar - no cigarettes - stopped in 6-09  . Alcohol Use: Yes     occ beer  . Drug Use: Not on file  . Sexually Active: Not on file   Other Topics Concern  . Not on file   Social History Narrative  . No narrative on file   Review of Systems Occ mild headaches Appetite is okay---definitely down from the past Weight is stable Sleep  still isn't great--freq awakening. Some daytime fatigue--occ naps and that helps    Objective:   Physical Exam  Constitutional: He appears well-developed and well-nourished. No distress.  Neck: Normal range of motion. Neck supple. No thyromegaly present.  Cardiovascular: Regular rhythm, normal heart sounds and intact distal pulses.  Exam reveals no gallop.   No murmur heard.      Bradycardic Normal pedal pulses  Pulmonary/Chest: Effort normal and breath sounds normal. No respiratory distress. He has no wheezes. He has no rales.  Abdominal: Soft. He exhibits no mass. There is no tenderness.  Musculoskeletal: Normal range of motion. He exhibits no edema and no tenderness.  Lymphadenopathy:    He has no cervical adenopathy.  Psychiatric: He has a normal mood and affect. His behavior is normal. Judgment and thought content normal.          Assessment & Plan:

## 2010-12-02 NOTE — Op Note (Signed)
NAMERANFERI, CLINGAN NO.:  [id number]   MEDICAL RECORD NO.:  [id number]                   PATIENT TYPE:  OIB   LOCATION:  2899                                 FACILITY:  [facility]   PHYSICIAN:  Beulah Gandy. Ashley Royalty, M.D.              DATE OF BIRTH:  Nov 26, 1940   DATE OF PROCEDURE:  11/25/2002  DATE OF DISCHARGE:                                 OPERATIVE REPORT   ADMISSION DIAGNOSIS:  Rhegmatogenous retinal detachment right eye.   PROCEDURE:  Scleral buckle right eye, retinal photocoagulation right eye,  subretinal fluid drainage right eye.   SURGEON:  Beulah Gandy. Ashley Royalty, M.D.   ASSISTANT:  Merian Capron, M.A.   ANESTHESIA:  General.   DESCRIPTION OF PROCEDURE:  Usual prep and drape.  360 degree limbal  peritomy. Isolation of four rectus muscles on 2-0 silk.  Localization of  breaks at 9 o'clock. Scleral dissection from 7 o'clock to 11 o'clock to  admit a #279 intrascleral implant with 2 mm trimmed from the posterior edge.  The diathermy was placed in the bed and the buckle was placed against the  diathermized bed. A total of three scleral sutures were placed in the  scleral flaps.  Three perforations, one at 8, one at 9, and one at 10,  obtained a thick, clear, colorless subretinal fluid.  The scleral flaps were  drawn securely. A 240 band was placed around the eye with a belt loop at 1,  3, and 5.  The band was attached to itself with a 270 sleeve at 2 o'clock.  Proper indentation of the globe was performed with shortening of the band.  The indirect ophthalmoscopy showed the retina to be lying nicely on the  scleral buckle with some areas of thick subretinal fluid remaining.  The  indirect ophthalmoscope laser was moved into place and 631 burns were placed  around the retinal breaks and just posterior to the area of thick subretinal  fluid formation mostly along the buckle. The power was between 400 and 600  milliwatts, 1000 microns each, and 0.1 to  0.05 seconds each.  The band was  adjusted and trimmed, the buckle was adjusted and trimmed, sutures were  adjusted and trimmed. The conjunctiva was reposited with 7-0 chromic suture.  Polymixin and gentamicin were irrigated into tenon's space. Atropine  solution was applied. Decadron 10 mg was injected into the lower  subconjunctival space. Marcaine was injected around the globe for  postoperative pain. Polysporin, a patch, and shield were placed.  Paracentesis x1 obtained a closing tension of 10 with a Barraquer tonometer.  Polysporin, a patch, and shield were placed. The patient was awakened and  taken to the recovery room in satisfactory condition.  Beulah Gandy. Ashley Royalty, M.D.    JDM/MEDQ  D:  11/25/2002  T:  11/26/2002  Job:  [id number]

## 2011-01-09 ENCOUNTER — Encounter: Payer: Self-pay | Admitting: Internal Medicine

## 2011-02-15 ENCOUNTER — Encounter: Payer: Self-pay | Admitting: Internal Medicine

## 2011-02-24 ENCOUNTER — Telehealth: Payer: Self-pay | Admitting: Internal Medicine

## 2011-02-24 ENCOUNTER — Encounter: Payer: Self-pay | Admitting: Internal Medicine

## 2011-02-24 ENCOUNTER — Ambulatory Visit (INDEPENDENT_AMBULATORY_CARE_PROVIDER_SITE_OTHER): Payer: MEDICARE | Admitting: Internal Medicine

## 2011-02-24 VITALS — BP 106/64 | HR 46 | Ht 66.0 in | Wt 186.0 lb

## 2011-02-24 DIAGNOSIS — I2510 Atherosclerotic heart disease of native coronary artery without angina pectoris: Secondary | ICD-10-CM

## 2011-02-24 DIAGNOSIS — E785 Hyperlipidemia, unspecified: Secondary | ICD-10-CM

## 2011-02-24 DIAGNOSIS — E782 Mixed hyperlipidemia: Secondary | ICD-10-CM

## 2011-02-24 MED ORDER — SIMVASTATIN 40 MG PO TABS
40.0000 mg | ORAL_TABLET | Freq: Every evening | ORAL | Status: DC
Start: 2011-02-24 — End: 2011-11-29

## 2011-02-24 NOTE — Telephone Encounter (Signed)
Pt wanted the nurse to know, he has receive her message and everything is fine with him.

## 2011-02-24 NOTE — Telephone Encounter (Signed)
LMOM that medication I sent in was for Simvastatin to [facility]. I asked him to call back to make sure he understands.

## 2011-02-24 NOTE — Assessment & Plan Note (Signed)
Patient remains active though he says he gives out.  On EKG today his heart rate is low.  It was in the 50s on last clinic visit to Dr. Alphonsus Sias I have asked him to cut his Bisoprolol into 1/4 tab.  Call on Monday to say how he feels Beyond this could consider trial of Ranexa.  I would not push towards this. Again, on R heart cath pulm pressures were normal.

## 2011-02-24 NOTE — Telephone Encounter (Signed)
Pt was seen by Dr. Tenny Craw and a medication was ordered Pravastin.  Pt already has that at home.  Does not need this ordered.  Please call patient and let him know this was cancelled.  [phone].

## 2011-02-24 NOTE — Assessment & Plan Note (Signed)
I think his LDL should be lower.  I would push this down further given concern for microvascular disease. Rec trial of Zocor 40.  F/u lipids and AST in 8 wks.

## 2011-02-24 NOTE — Patient Instructions (Addendum)
Fasting Lipid and ast in 12 weeks.  Your physician wants you to follow-up in:6 months You will receive a reminder letter in the mail two months in advance. If you don't receive a letter, please call our office to schedule the follow-up appointment.

## 2011-02-24 NOTE — Progress Notes (Signed)
HPI Patient is a 70 year old with a history of CP and SOB.  I saw him last fall for evaluation  R heart cath showed normal pulm pressuress.  L heart cath showed mild disease.  The vessels did appear small though, suspicous for diffuse atherosclerosis.  I saw the patient in clinic after the procedure.  I prescribed 2.5 Norvasc to see if it would provide any vasodilation   The patient did not notice any difference with this.  He ran out and did not have it refilled.   I prescribed Imdur and he did not tolerate this due to headaches.  I then Rx'd with bisobrolol.  He thought this helped. He has been seen by Dr. Alphonsus Sias in the interval.  LDL was 110 and HDL was 49 on check. NOw still gives out with activity.  No chest pains.  NO PND. Goes to [facility]  ON treadmill (10 to 15 min 2x per week).  On bike 20 min.  NOt worse on bisoprolol.   Takes meds at bedtime No dizziness.    Allergies  Allergen Reactions  . Ibuprofen     REACTION: ulcer    Current Outpatient Prescriptions  Medication Sig Dispense Refill  . aspirin 81 MG tablet Take 81 mg by mouth daily.        . bisoprolol (ZEBETA) 5 MG tablet Take 0.5 tablets (2.5 mg total) by mouth daily. 1/2 tab daily for exertional chest pain  30 tablet  5  . lovastatin (MEVACOR) 40 MG tablet Take 40 mg by mouth at bedtime.        . nitroGLYCERIN (NITROSTAT) 0.4 MG SL tablet Place 0.4 mg under the tongue. Take as directed         Past Medical History  Diagnosis Date  . Chest pain   . Allergic rhinitis   . GERD (gastroesophageal reflux disease)   . Hyperlipidemia   . Duodenal ulcer   . ED (erectile dysfunction)   . Osteoarthritis   . CAD (coronary artery disease)   . History of colonoscopy     Past Surgical History  Procedure Date  . Appendectomy 1964  . Hernia repair 1970    double hernia repair   . Retinal detachment surgery 2003  . Cardiac catheterization 03/2010    diffuse early disease  . Carotid u/s wnl 05/1998    Family History    Problem Relation Age of Onset  . Cancer Mother     brain cancer  that was metastatic  from lung   . Coronary artery disease Mother     CABG in 28's  . Cancer Father     stomach cancer- 54  . Diabetes Sister   . Stroke Maternal Grandmother     History   Social History  . Marital Status: Married    Spouse Name: N/A    Number of Children: 2  . Years of Education: N/A   Occupational History  . Haematologist     retired   Social History Main Topics  . Smoking status: Former Smoker    Types: Cigars    Quit date: 07/18/2007  . Smokeless tobacco: Never Used   Comment: Occ cigar - no cigarettes - stopped in 6-09  . Alcohol Use: Yes     occ beer  . Drug Use: Not on file  . Sexually Active: Not on file   Other Topics Concern  . Not on file   Social History Narrative  . No narrative on file  Review of Systems:  All systems reviewed.  They are negative to the above problem except as previously stated.  Vital Signs: BP 106/64  Pulse 46  Ht 5\' 6"  (1.676 m)  Wt 186 lb (84.369 kg)  BMI 30.02 kg/m2  Physical Exam  Patient is in NAD.  HEENT:  Normocephalic, atraumatic. EOMI, PERRLA.  Neck: JVP is normal. No thyromegaly. No bruits.  Lungs: clear to auscultation. No rales no wheezes.  Heart: Regular rate and rhythm. Normal S1, S2. No S3.   No significant murmurs. PMI not displaced.  Abdomen:  Supple, nontender. Normal bowel sounds. No masses. No hepatomegaly.  Extremities:   Good distal pulses throughout. No lower extremity edema.  Musculoskeletal :moving all extremities.  Neuro:   alert and oriented x3.  CN II-XII grossly intact.  EKG:  Sinus bradycardia.  46 bpm.  First degree AV block.  Assessment and Plan:

## 2011-03-02 ENCOUNTER — Encounter (INDEPENDENT_AMBULATORY_CARE_PROVIDER_SITE_OTHER): Payer: MEDICARE | Admitting: Ophthalmology

## 2011-03-02 DIAGNOSIS — H35379 Puckering of macula, unspecified eye: Secondary | ICD-10-CM

## 2011-03-02 DIAGNOSIS — H33109 Unspecified retinoschisis, unspecified eye: Secondary | ICD-10-CM

## 2011-03-02 DIAGNOSIS — H33009 Unspecified retinal detachment with retinal break, unspecified eye: Secondary | ICD-10-CM

## 2011-03-02 DIAGNOSIS — H43819 Vitreous degeneration, unspecified eye: Secondary | ICD-10-CM

## 2011-04-21 ENCOUNTER — Ambulatory Visit: Payer: MEDICARE | Admitting: Internal Medicine

## 2011-05-05 ENCOUNTER — Encounter: Payer: Self-pay | Admitting: Internal Medicine

## 2011-05-05 ENCOUNTER — Other Ambulatory Visit: Payer: Self-pay | Admitting: *Deleted

## 2011-05-05 ENCOUNTER — Ambulatory Visit (INDEPENDENT_AMBULATORY_CARE_PROVIDER_SITE_OTHER): Payer: MEDICARE | Admitting: Internal Medicine

## 2011-05-05 VITALS — BP 112/70 | HR 41 | Temp 97.6°F | Ht 66.0 in | Wt 186.4 lb

## 2011-05-05 DIAGNOSIS — I2510 Atherosclerotic heart disease of native coronary artery without angina pectoris: Secondary | ICD-10-CM

## 2011-05-05 DIAGNOSIS — R209 Unspecified disturbances of skin sensation: Secondary | ICD-10-CM

## 2011-05-05 DIAGNOSIS — Z0000 Encounter for general adult medical examination without abnormal findings: Secondary | ICD-10-CM

## 2011-05-05 DIAGNOSIS — R200 Anesthesia of skin: Secondary | ICD-10-CM | POA: Insufficient documentation

## 2011-05-05 DIAGNOSIS — E785 Hyperlipidemia, unspecified: Secondary | ICD-10-CM

## 2011-05-05 NOTE — Assessment & Plan Note (Signed)
Stable anginal pattern which is much improved on bisoprolol Seems like microcirculation issue

## 2011-05-05 NOTE — Assessment & Plan Note (Signed)
Doing well Colon due 2017 No PSA due to age zostavax if insurance covers

## 2011-05-05 NOTE — Assessment & Plan Note (Signed)
Mild and non specific Will have Dr Tenny Craw add labs--B12, TSH, met B, CBC

## 2011-05-05 NOTE — Progress Notes (Signed)
Subjective:    Patient ID: Jonathan Griffith, male    DOB: 1940/08/29, 70 y.o.   MRN: [id number]  HPI Doing well Still gets a little winded with prolonged walking Generally doing okay with his exercise  Did try lower dose of bisoprolol but noted no difference Back to the 2.5mg  daily  Ongoing hand joints pain Has some leg aching that is only noted since statin change. Very intermittent. Will just observe.hist  Current Outpatient Prescriptions on File Prior to Visit  Medication Sig Dispense Refill  . aspirin 81 MG tablet Take 81 mg by mouth daily.        . bisoprolol (ZEBETA) 5 MG tablet Take 0.5 tablets (2.5 mg total) by mouth daily. 1/2 tab daily for exertional chest pain  30 tablet  5  . nitroGLYCERIN (NITROSTAT) 0.4 MG SL tablet Place 0.4 mg under the tongue. Take as directed       . simvastatin (ZOCOR) 40 MG tablet Take 1 tablet (40 mg total) by mouth every evening.  90 tablet  3    Allergies  Allergen Reactions  . Ibuprofen     REACTION: ulcer    Past Medical History  Diagnosis Date  . Chest pain   . Allergic rhinitis   . GERD (gastroesophageal reflux disease)   . Hyperlipidemia   . Duodenal ulcer   . ED (erectile dysfunction)   . Osteoarthritis   . CAD (coronary artery disease)   . History of colonoscopy     Past Surgical History  Procedure Date  . Appendectomy 1964  . Hernia repair 1970    double hernia repair   . Retinal detachment surgery 2003  . Cardiac catheterization 03/2010    diffuse early disease  . Carotid u/s wnl 05/1998    Family History  Problem Relation Age of Onset  . Cancer Mother     brain cancer  that was metastatic  from lung   . Coronary artery disease Mother     CABG in 40's  . Cancer Father     stomach cancer- 31  . Diabetes Sister   . Stroke Maternal Grandmother     History   Social History  . Marital Status: Married    Spouse Name: N/A    Number of Children: 2  . Years of Education: N/A   Occupational History  . Stage manager     retired   Social History Main Topics  . Smoking status: Former Smoker    Types: Cigars    Quit date: 07/18/2007  . Smokeless tobacco: Never Used   Comment: Occ cigar - no cigarettes - stopped in 6-09  . Alcohol Use: Yes     occ beer  . Drug Use: Not on file  . Sexually Active: Not on file   Other Topics Concern  . Not on file   Social History Narrative  . No narrative on file   Review of Systems  Constitutional: Negative for fatigue and unexpected weight change.       Wears seat belt  HENT: Positive for hearing loss and sneezing. Negative for congestion and tinnitus.        Mild hearing loss--not a big deal Has dentures  Eyes: Negative for visual disturbance.       No diplopia or unilateral vision loss Does have some AM blurry vision----cataract on observation  Respiratory: Positive for shortness of breath. Negative for cough and chest tightness.        Stable DOE  Cardiovascular:  Positive for palpitations. Negative for chest pain and leg swelling.  Gastrointestinal: Negative for nausea, vomiting, abdominal pain, constipation and blood in stool.       Occ indigestion--no meds needed  Genitourinary: Negative for urgency, frequency and difficulty urinating.       No sex --no problem  Musculoskeletal: Positive for myalgias and arthralgias. Negative for back pain and joint swelling.  Skin: Negative for rash.  Neurological: Negative for dizziness, syncope, weakness, light-headedness and headaches.       Occ feet and hand tingling---esp in AM  Hematological: Negative for adenopathy. Bruises/bleeds easily.  Psychiatric/Behavioral: Positive for sleep disturbance. Negative for dysphoric mood. The patient is not nervous/anxious.        Never a great sleeper--occ daytime somnolence       Objective:   Physical Exam  Constitutional: He is oriented to person, place, and time. He appears well-developed and well-nourished. No distress.  HENT:  Head: Normocephalic and  atraumatic.  Right Ear: External ear normal.  Left Ear: External ear normal.  Mouth/Throat: Oropharynx is clear and moist. No oropharyngeal exudate.       TMs normal  Eyes: Conjunctivae and EOM are normal. Pupils are equal, round, and reactive to light.  Neck: Normal range of motion. Neck supple. No thyromegaly present.  Cardiovascular: Normal rate, regular rhythm, normal heart sounds and intact distal pulses.  Exam reveals no gallop.   No murmur heard. Pulmonary/Chest: Effort normal and breath sounds normal. No respiratory distress. He has no wheezes. He has no rales.  Abdominal: Soft. He exhibits no mass. There is no tenderness.  Musculoskeletal: Normal range of motion. He exhibits no edema and no tenderness.  Lymphadenopathy:    He has no cervical adenopathy.  Neurological: He is alert and oriented to person, place, and time.  Skin: Skin is warm. No rash noted.  Psychiatric: He has a normal mood and affect. His behavior is normal. Judgment and thought content normal.          Assessment & Plan:

## 2011-05-05 NOTE — Assessment & Plan Note (Signed)
New med per Dr Kem Parkinson due soon

## 2011-05-17 ENCOUNTER — Other Ambulatory Visit (INDEPENDENT_AMBULATORY_CARE_PROVIDER_SITE_OTHER): Payer: Medicare Other | Admitting: *Deleted

## 2011-05-17 DIAGNOSIS — E785 Hyperlipidemia, unspecified: Secondary | ICD-10-CM

## 2011-05-17 DIAGNOSIS — R209 Unspecified disturbances of skin sensation: Secondary | ICD-10-CM

## 2011-05-17 LAB — CBC WITH DIFFERENTIAL/PLATELET
Basophils Relative: 0.5 % (ref 0.0–3.0)
Eosinophils Relative: 0.7 % (ref 0.0–5.0)
HCT: 43.9 % (ref 39.0–52.0)
Hemoglobin: 15 g/dL (ref 13.0–17.0)
Lymphs Abs: 2.2 10*3/uL (ref 0.7–4.0)
MCV: 89.9 fl (ref 78.0–100.0)
Monocytes Absolute: 0.7 10*3/uL (ref 0.1–1.0)
Monocytes Relative: 9.4 % (ref 3.0–12.0)
Platelets: 208 10*3/uL (ref 150.0–400.0)
RBC: 4.88 Mil/uL (ref 4.22–5.81)
WBC: 7 10*3/uL (ref 4.5–10.5)

## 2011-05-17 LAB — BASIC METABOLIC PANEL
Chloride: 105 mEq/L (ref 96–112)
GFR: 80.92 mL/min (ref >60.00)
Potassium: 4 mEq/L (ref 3.5–5.1)
Sodium: 139 mEq/L (ref 135–145)

## 2011-05-17 LAB — LIPID PANEL
Cholesterol: 133 mg/dL (ref 0–200)
LDL Cholesterol: 68 mg/dL (ref 0–99)
Total CHOL/HDL Ratio: 3
VLDL: 12.4 mg/dL (ref 0.0–40.0)

## 2011-05-17 LAB — TSH: TSH: 1.08 u[IU]/mL (ref 0.35–5.50)

## 2011-05-17 LAB — VITAMIN B12: Vitamin B-12: 276 pg/mL (ref 211–911)

## 2011-08-24 ENCOUNTER — Encounter: Payer: Self-pay | Admitting: Internal Medicine

## 2011-08-24 ENCOUNTER — Ambulatory Visit (INDEPENDENT_AMBULATORY_CARE_PROVIDER_SITE_OTHER): Payer: Medicare Other | Admitting: Internal Medicine

## 2011-08-24 VITALS — BP 138/80 | HR 50 | Ht 67.0 in | Wt 189.0 lb

## 2011-08-24 DIAGNOSIS — I498 Other specified cardiac arrhythmias: Secondary | ICD-10-CM

## 2011-08-24 DIAGNOSIS — I2510 Atherosclerotic heart disease of native coronary artery without angina pectoris: Secondary | ICD-10-CM

## 2011-08-24 DIAGNOSIS — E785 Hyperlipidemia, unspecified: Secondary | ICD-10-CM

## 2011-08-24 DIAGNOSIS — R001 Bradycardia, unspecified: Secondary | ICD-10-CM | POA: Insufficient documentation

## 2011-08-24 MED ORDER — NITROGLYCERIN 0.4 MG SL SUBL: 0.4000 mg | SUBLINGUAL_TABLET | SUBLINGUAL | Status: DC | PRN

## 2011-08-24 NOTE — Patient Instructions (Signed)
Your physician has requested that you have en exercise stress myoview. For further information please visit [email or web address] Please follow instruction sheet, as given.  Your physician has recommended that you wear a holter monitor. Holter monitors are medical devices that record the heart's electrical activity. Doctors most often use these monitors to diagnose arrhythmias. Arrhythmias are problems with the speed or rhythm of the heartbeat. The monitor is a small, portable device. You can wear one while you do your normal daily activities. This is usually used to diagnose what is causing palpitations/syncope (passing out).  Your physician wants you to follow-up in: 6 months You will receive a reminder letter in the mail two months in advance. If you don't receive a letter, please call our office to schedule the follow-up appointment.

## 2011-08-24 NOTE — Progress Notes (Signed)
HPI Patient is a 71 year old with a history of CAD.  Last cath in 2011 showed mild disease but vessels appeared smaller, consistent with probable diffuse disease.  No critical narrowings.   I last saw him in Aug  At that time he was SOB and had chest tightness with activity.  I had tride to adjust meds since 1 year ago.  Did not tolerate imdur due to HA.  Bisoprolol added.  I backed off to 1/2. Since seen he still gives out.  Gets SOB and chest tightness if walks long enough.  No dizziness.  No syncope.    Allergies  Allergen Reactions  . Ibuprofen     REACTION: ulcer    Current Outpatient Prescriptions  Medication Sig Dispense Refill  . aspirin 81 MG tablet Take 81 mg by mouth daily.        . bisoprolol (ZEBETA) 5 MG tablet Take 0.5 tablets (2.5 mg total) by mouth daily. 1/2 tab daily for exertional chest pain  30 tablet  5  . nitroGLYCERIN (NITROSTAT) 0.4 MG SL tablet Place 0.4 mg under the tongue. Take as directed       . simvastatin (ZOCOR) 40 MG tablet Take 1 tablet (40 mg total) by mouth every evening.  90 tablet  3    Past Medical History  Diagnosis Date  . Chest pain   . Allergic rhinitis   . GERD (gastroesophageal reflux disease)   . Hyperlipidemia   . Duodenal ulcer   . ED (erectile dysfunction)   . Osteoarthritis   . CAD (coronary artery disease)   . History of colonoscopy     Past Surgical History  Procedure Date  . Appendectomy 1964  . Hernia repair 1970    double hernia repair   . Retinal detachment surgery 2003  . Cardiac catheterization 03/2010    diffuse early disease  . Carotid u/s wnl 05/1998    Family History  Problem Relation Age of Onset  . Cancer Mother     brain cancer  that was metastatic  from lung   . Coronary artery disease Mother     CABG in 55's  . Cancer Father     stomach cancer- 20  . Diabetes Sister   . Stroke Maternal Grandmother     History   Social History  . Marital Status: Married    Spouse Name: N/A    Number of  Children: 2  . Years of Education: N/A   Occupational History  . Haematologist     retired   Social History Main Topics  . Smoking status: Former Smoker    Types: Cigars    Quit date: 07/18/2007  . Smokeless tobacco: Never Used   Comment: Occ cigar - no cigarettes - stopped in 6-09  . Alcohol Use: Yes     occ beer  . Drug Use: Not on file  . Sexually Active: Not on file   Other Topics Concern  . Not on file   Social History Narrative  . No narrative on file    Review of Systems:  All systems reviewed.  They are negative to the above problem except as previously stated.  Vital Signs: BP 138/80  Pulse 50  Ht 5\' 7"  (1.702 m)  Wt 189 lb (85.73 kg)  BMI 29.60 kg/m2  Physical Exam Patient is in NAD HEENT:  Normocephalic, atraumatic. EOMI, PERRLA.  Neck: JVP is normal. No thyromegaly. No bruits.  Lungs: clear to auscultation. No rales no  wheezes.  Heart: Regular rate and rhythm. Normal S1, S2. No S3.   No significant murmurs. PMI not displaced.  Abdomen:  Supple, nontender. Normal bowel sounds. No masses. No hepatomegaly.  Extremities:   Good distal pulses throughout. No lower extremity edema.  Musculoskeletal :moving all extremities.  Neuro:   alert and oriented x3.  CN II-XII grossly intact. EKG:  SB 50  Occasional PAC.  First degree AV block  264 msec.  Assessment and Plan:

## 2011-08-24 NOTE — Assessment & Plan Note (Signed)
Keep on meds. 

## 2011-08-24 NOTE — Assessment & Plan Note (Signed)
Patient with continued symptoms.  I would recomm a stress myoivew to rule out inducible ischemia   Patient with no critical lesions on cath   But vessels appeared smaller consistent with diffuse disease.

## 2011-08-24 NOTE — Assessment & Plan Note (Signed)
Will schedule holter monitor.  Question if contributing to SOB>

## 2011-08-30 ENCOUNTER — Encounter (INDEPENDENT_AMBULATORY_CARE_PROVIDER_SITE_OTHER): Payer: Medicare Other

## 2011-08-30 ENCOUNTER — Ambulatory Visit (HOSPITAL_COMMUNITY): Payer: Medicare Other | Attending: Cardiovascular Disease | Admitting: Radiology

## 2011-08-30 VITALS — BP 160/90 | Ht 66.0 in | Wt 183.0 lb

## 2011-08-30 DIAGNOSIS — Z87891 Personal history of nicotine dependence: Secondary | ICD-10-CM | POA: Insufficient documentation

## 2011-08-30 DIAGNOSIS — R0989 Other specified symptoms and signs involving the circulatory and respiratory systems: Secondary | ICD-10-CM | POA: Insufficient documentation

## 2011-08-30 DIAGNOSIS — R000 Tachycardia, unspecified: Secondary | ICD-10-CM | POA: Insufficient documentation

## 2011-08-30 DIAGNOSIS — Z8249 Family history of ischemic heart disease and other diseases of the circulatory system: Secondary | ICD-10-CM | POA: Insufficient documentation

## 2011-08-30 DIAGNOSIS — R0609 Other forms of dyspnea: Secondary | ICD-10-CM | POA: Insufficient documentation

## 2011-08-30 DIAGNOSIS — I498 Other specified cardiac arrhythmias: Secondary | ICD-10-CM

## 2011-08-30 DIAGNOSIS — R0789 Other chest pain: Secondary | ICD-10-CM | POA: Insufficient documentation

## 2011-08-30 DIAGNOSIS — E785 Hyperlipidemia, unspecified: Secondary | ICD-10-CM | POA: Insufficient documentation

## 2011-08-30 DIAGNOSIS — R0602 Shortness of breath: Secondary | ICD-10-CM

## 2011-08-30 DIAGNOSIS — I2510 Atherosclerotic heart disease of native coronary artery without angina pectoris: Secondary | ICD-10-CM | POA: Insufficient documentation

## 2011-08-30 DIAGNOSIS — R5381 Other malaise: Secondary | ICD-10-CM | POA: Insufficient documentation

## 2011-08-30 DIAGNOSIS — R079 Chest pain, unspecified: Secondary | ICD-10-CM

## 2011-08-30 DIAGNOSIS — R002 Palpitations: Secondary | ICD-10-CM | POA: Insufficient documentation

## 2011-08-30 MED ORDER — TECHNETIUM TC 99M TETROFOSMIN IV KIT
30.0000 | PACK | Freq: Once | INTRAVENOUS | Status: AC | PRN
  Administered 2011-08-30: 30 via INTRAVENOUS

## 2011-08-30 MED ORDER — TECHNETIUM TC 99M TETROFOSMIN IV KIT
10.0000 | PACK | Freq: Once | INTRAVENOUS | Status: AC | PRN
  Administered 2011-08-30: 10 via INTRAVENOUS

## 2011-08-30 MED ORDER — REGADENOSON 0.4 MG/5ML IV SOLN
0.4000 mg | Freq: Once | INTRAVENOUS | Status: AC
  Administered 2011-08-30: 0.4 mg via INTRAVENOUS

## 2011-08-30 NOTE — Progress Notes (Signed)
[facility] SITE 3 NUCLEAR MED [address] [place] [address] [phone]  Cardiology Nuclear Med Study  Jonathan Griffith is a 71 y.o. male [id number] [birth year]   Nuclear Med Background Indication for Stress Test:  Evaluation for Ischemia History: '11 Heart Catheterization:Multi-vessel N/O CAD, EF=60% Cardiac Risk Factors: Family History - CAD, History of Smoking and Lipids  Symptoms:  Chest Tightness with Exertion (last episode of chest discomfort was yesterday), DOE, Fatigue, Palpitations and Rapid HR   Nuclear Pre-Procedure Caffeine/Decaff Intake:  None NPO After: 7:00pm   Lungs:  Clear. IV 0.9% NS with Angio Cath:  20g  IV Site: R Antecubital  IV Started by:  Bonnita Levan, RN  Chest Size (in):  46 Cup Size: n/a  Height: 5\' 6"  (1.676 m)  Weight:  183 lb (83.008 kg)  BMI:  Body mass index is 29.54 kg/(m^2). Tech Comments:  Bisoprolol held x 48 hrs    Nuclear Med Study 1 or 2 day study: 1 day  Stress Test Type:  Treadmill/Lexiscan  Reading MD: Charlton Haws, MD  Order Authorizing Provider:  Dietrich Pates, MD  Resting Radionuclide: Technetium 47m Tetrofosmin  Resting Radionuclide Dose: 11.0 mCi   Stress Radionuclide:  Technetium 60m Tetrofosmin  Stress Radionuclide Dose: 33.0 mCi           Stress Protocol Rest HR: 41-50 Stress HR: 88  Rest BP: Sitting  160/90  Standing  150/82 Stress BP: 216/83*  Exercise Time (min): 6:46 METS: 6.2          Dose of Adenosine (mg):  n/a Dose of Lexiscan: n/a mg  Dose of Atropine (mg): n/a Dose of Dobutamine: n/a mcg/kg/min (at max HR)  Stress Test Technologist: Smiley Houseman, CMA-N  Nuclear Technologist:  Domenic Polite, CNMT     Rest Procedure:  Myocardial perfusion imaging was performed at rest 45 minutes following the intravenous administration of Technetium 38m Tetrofosmin.  Rest ECG: Sinus bradycardia with 1st degree AVB.  Stress Procedure:  The patient attempted to walk the treadmill utilizing the  Bruce Protocol for 4:45 , but was unable to reach his target heart rate.  He then received IV Lexiscan 0.4 mg over 15-seconds with concurrent low level exercise and then Technetium 56m Tetrofosmin was injected at 30-seconds while the patient continued walking one more minute.  There were no diagnostic ST-T wave changes with Lexiscan.  There were occasional PVC's and PAC's.  He did have a hypertensive response, 216/83.  He did c/o chest tightness, 6/10, while walking prior to Lexiscan  Quantitative spect images were obtained after a 45-minute delay.  Stress ECG: No significant change from baseline ECG  QPS Raw Data Images:  Normal; no motion artifact; normal heart/lung ratio. Stress Images:  Normal homogeneous uptake in all areas of the myocardium. Rest Images:  Normal homogeneous uptake in all areas of the myocardium. Subtraction (SDS):  Normal Transient Ischemic Dilatation (Normal <1.22):  1.11 Lung/Heart Ratio (Normal <0.45):  0.35  Quantitative Gated Spect Images QGS EDV:  115 ml QGS ESV:  41 ml QGS cine images:  NL LV Function; NL Wall Motion QGS EF: 64%  Impression Exercise Capacity:  Fair exercise capacity. BP Response:  Normal blood pressure response. Clinical Symptoms:  Mild chest pain/dyspnea. ECG Impression:  No significant ST segment change suggestive of ischemia. Comparison with Prior Nuclear Study: No previous nuclear study performed  Overall Impression:  Normal stress nuclear study. Mild apical thinning not thought to be significant  Patient had chest pain and  dyspnea with exercise   Charlton Haws

## 2011-09-04 ENCOUNTER — Telehealth: Payer: Self-pay | Admitting: *Deleted

## 2011-09-04 NOTE — Telephone Encounter (Addendum)
Called patient concerning results of Holter monitor. Results showed sinus rhythm rates 43 to 83 with an average heart rate of 56. Dr.Ross ordered to stop Zebeta and have BP and HR check in 1 week at home and call in to [place] to report findings. Patient verbalized understanding.

## 2011-09-11 ENCOUNTER — Telehealth: Payer: Self-pay | Admitting: Internal Medicine

## 2011-09-11 NOTE — Telephone Encounter (Signed)
Pt told by Annice Pih to call with info : Heart rate 56, BP 118/74

## 2011-09-11 NOTE — Telephone Encounter (Signed)
I would recomm that he stay of Zebeta.   Have patient call back in 1 month to see if response continues.

## 2011-09-11 NOTE — Telephone Encounter (Signed)
Patient seen in clinic earlier this month. Complained of SOB and CP Myoview wasl normal Holter monitor showed bradycardia with longest pause of 2.2 seconds. Stopped Zebeta.  Patient feeling better.  Will follow.  Would not recomm pacer now.

## 2011-09-11 NOTE — Telephone Encounter (Signed)
Called patient back. He is now off the [place]. Feels a little better. More energy and no more chest pain. Will discuss with Dr. Tenny Craw and call her back.

## 2011-09-13 NOTE — Telephone Encounter (Signed)
Called patient with MD recommendations. He will let us know how he is doing in about a month.

## 2011-11-29 ENCOUNTER — Other Ambulatory Visit: Payer: Self-pay | Admitting: Internal Medicine

## 2011-11-29 DIAGNOSIS — I2510 Atherosclerotic heart disease of native coronary artery without angina pectoris: Secondary | ICD-10-CM

## 2011-11-29 DIAGNOSIS — E785 Hyperlipidemia, unspecified: Secondary | ICD-10-CM

## 2011-11-29 MED ORDER — SIMVASTATIN 40 MG PO TABS: 40.0000 mg | ORAL_TABLET | Freq: Every evening | ORAL | Status: DC

## 2011-12-22 ENCOUNTER — Telehealth: Payer: Self-pay | Admitting: Internal Medicine

## 2011-12-22 NOTE — Telephone Encounter (Signed)
New msg Pt in march BP 128/76 hr 70 Last week BP 156/80 hr 56  He wanted You to know

## 2011-12-23 NOTE — Telephone Encounter (Signed)
Needs to keep an eye on BP.  If consistently above 150 will need Rx for another antihypertensive

## 2011-12-29 NOTE — Telephone Encounter (Signed)
Pt aware and he will monitor and call back after keeping a diary for a couple of weeks

## 2012-01-05 ENCOUNTER — Telehealth: Payer: Self-pay | Admitting: Internal Medicine

## 2012-01-05 ENCOUNTER — Encounter: Payer: Self-pay | Admitting: Internal Medicine

## 2012-01-05 ENCOUNTER — Ambulatory Visit (INDEPENDENT_AMBULATORY_CARE_PROVIDER_SITE_OTHER): Payer: Medicare Other | Admitting: Internal Medicine

## 2012-01-05 VITALS — BP 148/80 | HR 55 | Temp 98.0°F | Wt 184.0 lb

## 2012-01-05 DIAGNOSIS — IMO0002 Reserved for concepts with insufficient information to code with codable children: Secondary | ICD-10-CM

## 2012-01-05 DIAGNOSIS — L03113 Cellulitis of right upper limb: Secondary | ICD-10-CM | POA: Insufficient documentation

## 2012-01-05 MED ORDER — CEPHALEXIN 500 MG PO CAPS: 500.0000 mg | ORAL_CAPSULE | Freq: Three times a day (TID) | ORAL | Status: AC

## 2012-01-05 NOTE — Assessment & Plan Note (Signed)
Clearly has bacterial infection Will give keflex Warm compresses

## 2012-01-05 NOTE — Progress Notes (Signed)
  Subjective:    Patient ID: Jonathan Griffith, male    DOB: [birth year], 71 y.o.   MRN: [id number]  HPI Scratched right arm 6 days ago [place] and then has scabbed up Has had some swelling and now has heat Mild pain if he is lying on it  Tried warm compresses and peroxide No discharge  Current Outpatient Prescriptions on File Prior to Visit  Medication Sig Dispense Refill  . aspirin 81 MG tablet Take 81 mg by mouth daily.        . nitroGLYCERIN (NITROSTAT) 0.4 MG SL tablet Place 1 tablet (0.4 mg total) under the tongue every 5 (five) minutes as needed for chest pain. Take as directed  25 tablet  3  . simvastatin (ZOCOR) 40 MG tablet Take 1 tablet (40 mg total) by mouth every evening.  90 tablet  3  . DISCONTD: nitroGLYCERIN (NITROSTAT) 0.4 MG SL tablet Place 1 tablet (0.4 mg total) under the tongue every 5 (five) minutes as needed for chest pain. Take as directed  25 tablet  6    Allergies  Allergen Reactions  . Ibuprofen     REACTION: ulcer    Past Medical History  Diagnosis Date  . Chest pain   . Allergic rhinitis   . GERD (gastroesophageal reflux disease)   . Hyperlipidemia   . Duodenal ulcer   . ED (erectile dysfunction)   . Osteoarthritis   . CAD (coronary artery disease)   . History of colonoscopy     Past Surgical History  Procedure Date  . Appendectomy 1964  . Hernia repair 1970    double hernia repair   . Retinal detachment surgery 2003  . Cardiac catheterization 03/2010    diffuse early disease  . Carotid u/s wnl 05/1998    Family History  Problem Relation Age of Onset  . Cancer Mother     brain cancer  that was metastatic  from lung   . Coronary artery disease Mother     CABG in 55's  . Cancer Father     stomach cancer- 56  . Diabetes Sister   . Stroke Maternal Grandmother     History   Social History  . Marital Status: Married    Spouse Name: N/A    Number of Children: 2  . Years of Education: N/A   Occupational History  . Haematologist    retired   Social History Main Topics  . Smoking status: Former Smoker    Types: Cigars    Quit date: 07/18/2007  . Smokeless tobacco: Never Used   Comment: Occ cigar - no cigarettes - stopped in 6-09  . Alcohol Use: Yes     occ beer  . Drug Use: Not on file  . Sexually Active: Not on file   Other Topics Concern  . Not on file   Social History Narrative  . No narrative on file   Review of Systems No fever No nausea or vomiting    Objective:   Physical Exam  Constitutional: He appears well-developed and well-nourished. No distress.  Skin:       2 open areas with thick black eschar on volar right forearm Surrounding deep redness, warmth and tenderness          Assessment & Plan:

## 2012-01-05 NOTE — Telephone Encounter (Signed)
Caller: Adler/Patient; PCP: Tillman Abide; CB#: [phone];  Call regarding Scratched arm on piece of wood/tree limb on 12/30/11 and now has 2 large Scabs - looks red and puffy and feels warm to touch. Afebrile.  Arm stings and burns at times. He is wondering if scabs need to be Taken Off or if antibiotic needed. Triage and Care advice per [facility] Protocol and appnt advised within 4 hours for "any signs and sx of worsening infection". Appnt scheduled for 1130 01/05/12 with Dr. Alphonsus Sias.

## 2012-01-05 NOTE — Telephone Encounter (Signed)
Will evaluate in office

## 2012-02-20 ENCOUNTER — Ambulatory Visit (INDEPENDENT_AMBULATORY_CARE_PROVIDER_SITE_OTHER): Payer: Medicare Other | Admitting: Cardiovascular Disease

## 2012-02-20 ENCOUNTER — Encounter: Payer: Self-pay | Admitting: Cardiovascular Disease

## 2012-02-20 VITALS — BP 152/72 | HR 52 | Ht 66.0 in | Wt 184.0 lb

## 2012-02-20 DIAGNOSIS — I2510 Atherosclerotic heart disease of native coronary artery without angina pectoris: Secondary | ICD-10-CM

## 2012-02-20 NOTE — Patient Instructions (Signed)
Your physician recommends that you schedule a follow-up appointment in: 3 months.  

## 2012-02-20 NOTE — Progress Notes (Signed)
History of Present Illness: 71 yo WM with history of CAD, HLD, GERD who is here today to establish care with me. He has been followed in the past by Dr. Tenny Craw. His last cardiac cath 04/12/10 with Dr.Brodie showed 40% proximal LAD, 50%  Mid to distal LAD, diffuse narrowing RCA. He was last seen by Dr. Tenny Craw in February 2013.  He has not tolerated Imdur in the past. He had bradycardia with Bystolic so it was stopped. Stress myoview February 2013 with no large areas of ischemia, LVEF=64%.   He tells me that he continues to have dyspnea with minimal exertion. He has had no chest pain but his energy level is still down. No other complaints.   Primary Care Physician: Alphonsus Sias  Last Lipid Profile:  Lipid Panel     Component Value Date/Time   CHOL 133 05/17/2011 1113   TRIG 62.0 05/17/2011 1113   HDL 53.10 05/17/2011 1113   CHOLHDL 3 05/17/2011 1113   VLDL 12.4 05/17/2011 1113   LDLCALC 68 05/17/2011 1113     Past Medical History  Diagnosis Date  . Chest pain   . Allergic rhinitis   . GERD (gastroesophageal reflux disease)   . Hyperlipidemia   . Duodenal ulcer   . ED (erectile dysfunction)   . Osteoarthritis   . CAD (coronary artery disease)   . History of colonoscopy     Past Surgical History  Procedure Date  . Appendectomy 1964  . Hernia repair 1970    double hernia repair   . Retinal detachment surgery 2003  . Cardiac catheterization 03/2010    diffuse early disease  . Tonsillectomy     Current Outpatient Prescriptions  Medication Sig Dispense Refill  . aspirin 81 MG tablet Take 81 mg by mouth daily.        . nitroGLYCERIN (NITROSTAT) 0.4 MG SL tablet Place 1 tablet (0.4 mg total) under the tongue every 5 (five) minutes as needed for chest pain. Take as directed  25 tablet  3  . simvastatin (ZOCOR) 40 MG tablet Take 1 tablet (40 mg total) by mouth every evening.  90 tablet  3    Allergies  Allergen Reactions  . Ibuprofen     REACTION: ulcer    History   Social  History  . Marital Status: Married    Spouse Name: N/A    Number of Children: 2  . Years of Education: N/A   Occupational History  . Haematologist     retired   Social History Main Topics  . Smoking status: Former Smoker -- 1.0 packs/day for 10 years    Types: Cigars    Quit date: 07/18/2007  . Smokeless tobacco: Never Used   Comment: Occ cigar - no cigarettes - stopped in 6-09  . Alcohol Use: 0.5 oz/week    1 drink(s) per week     occ beer  . Drug Use: No  . Sexually Active: Not on file   Other Topics Concern  . Not on file   Social History Narrative  . No narrative on file    Family History  Problem Relation Age of Onset  . Cancer Mother     brain cancer  that was metastatic  from lung   . Coronary artery disease Mother     CABG in 2's  . Cancer Father     stomach cancer- 90  . Diabetes Sister   . Stroke Maternal Grandmother     Review of Systems:  As stated in the HPI and otherwise negative.   BP 152/72  Pulse 52  Ht 5\' 6"  (1.676 m)  Wt 184 lb (83.462 kg)  BMI 29.70 kg/m2  Physical Examination: General: Well developed, well nourished, NAD HEENT: OP clear, mucus membranes moist SKIN: warm, dry. No rashes. Neuro: No focal deficits Musculoskeletal: Muscle strength 5/5 all ext Psychiatric: Mood and affect normal Neck: No JVD, no carotid bruits, no thyromegaly, no lymphadenopathy. Lungs:Clear bilaterally, no wheezes, rhonci, crackles Cardiovascular: Regular rate and rhythm. No murmurs, gallops or rubs. Abdomen:Soft. Bowel sounds present. Non-tender.  Extremities: No lower extremity edema. Pulses are 2 + in the bilateral DP/PT.  EKG: Sinus bradycardia, rate 54 bpm. 1st degree AV block.

## 2012-02-20 NOTE — Assessment & Plan Note (Addendum)
Stable. He has dysnpea but no chest pain. Recent normal stress test with normal LV function.  I cannot exclude progression of his CAD but there do not seem to be any big changes in his symptom complex. Will ask Dr. Alphonsus Sias about possible PFTs and exclude pulmonary cause for dyspnea. If this is all negative, will probably pursue repeat right and left heart cath later this fall. I will see him back in 3 months. BP slightly elevated today but well controlled at home. He will follow closely.

## 2012-02-21 ENCOUNTER — Telehealth: Payer: Self-pay | Admitting: Cardiovascular Disease

## 2012-02-21 DIAGNOSIS — I2510 Atherosclerotic heart disease of native coronary artery without angina pectoris: Secondary | ICD-10-CM

## 2012-02-21 NOTE — Telephone Encounter (Signed)
Follow-up: ° ° ° °Patient returned you call.  Please call back. °

## 2012-02-21 NOTE — Telephone Encounter (Signed)
Left message to call back  

## 2012-02-21 NOTE — Telephone Encounter (Signed)
Spoke with pt. He is calling to report blood pressure of 151/71 with heart rate of 53 today.  He is on no blood pressure medications.  I instructed pt to continue to check blood pressure and heart rate daily for 7-10 days and to call us with readings.

## 2012-02-21 NOTE — Telephone Encounter (Signed)
Need problem;  Patient calling was seen on yesterday alternative for blood pressure to come down.

## 2012-03-01 ENCOUNTER — Encounter (INDEPENDENT_AMBULATORY_CARE_PROVIDER_SITE_OTHER): Payer: Medicare Other | Admitting: Ophthalmology

## 2012-03-01 DIAGNOSIS — H35379 Puckering of macula, unspecified eye: Secondary | ICD-10-CM

## 2012-03-01 DIAGNOSIS — H43819 Vitreous degeneration, unspecified eye: Secondary | ICD-10-CM

## 2012-03-01 DIAGNOSIS — H33109 Unspecified retinoschisis, unspecified eye: Secondary | ICD-10-CM

## 2012-03-01 DIAGNOSIS — H33009 Unspecified retinal detachment with retinal break, unspecified eye: Secondary | ICD-10-CM

## 2012-03-01 DIAGNOSIS — H2510 Age-related nuclear cataract, unspecified eye: Secondary | ICD-10-CM

## 2012-03-04 NOTE — Telephone Encounter (Signed)
Pt calling to report blood pressures and heart rates:  August 8--138/73, 56 at 12:30                    148/78, 60 at 3:00 August 9--150/80, 60 August 12--138/76,56 at 11:00 August 13--152/78 August 14--146/73, 54.  He does not want to take medications for blood pressure unless Dr. Clifton Alazar feels he needs them.  He reports he still has shortness of breath when he does a lot of work but otherwise is feeling fine.

## 2012-03-04 NOTE — Telephone Encounter (Signed)
I really think his BP needs to be better controlled. Can we ask him if he would be willing to start Norvasc 5 mg po qdaily? Thanks, chris

## 2012-03-05 MED ORDER — LOSARTAN POTASSIUM 50 MG PO TABS: 50.0000 mg | ORAL_TABLET | Freq: Every day | ORAL | Status: DC

## 2012-03-05 NOTE — Telephone Encounter (Signed)
Spoke with pt. He is willing to try Norvasc.  Will send prescription to [facility] pharmacy in [place] for 30 day supply and also to [facility] for 90 day supply.  He will continue to monitor blood pressures and bring readings to next office visit with Dr. Clifton Kayde

## 2012-03-05 NOTE — Telephone Encounter (Signed)
Spoke with pt and gave him instructions from Dr. Clifton Danil. He will start Cozaar instead of Norvasc. He is going on cruise this Friday and will return on March 16, 2012.  He will come in for lab work on March 19, 2012.  Will send prescription to [facility] pharmacy in [place].

## 2012-03-05 NOTE — Telephone Encounter (Signed)
Pt also on Simvastatin 40 mg daily.  Will review with Dr. Clifton Alixander if OK to start Norvasc while on simvastatin

## 2012-03-05 NOTE — Telephone Encounter (Signed)
Pat, I did not see the Zocor. I do not want to start the Norvasc. Can we start him on Cozaar 50 mg po Qdaily? He will need a BMET in 10 days. Thanks, chris

## 2012-03-19 ENCOUNTER — Other Ambulatory Visit (INDEPENDENT_AMBULATORY_CARE_PROVIDER_SITE_OTHER): Payer: Medicare Other

## 2012-03-19 DIAGNOSIS — I2510 Atherosclerotic heart disease of native coronary artery without angina pectoris: Secondary | ICD-10-CM

## 2012-03-19 LAB — BASIC METABOLIC PANEL
Calcium: 9.1 mg/dL (ref 8.4–10.5)
Chloride: 101 mEq/L (ref 96–112)
Creatinine, Ser: 0.9 mg/dL (ref 0.4–1.5)
Sodium: 137 mEq/L (ref 135–145)

## 2012-05-08 ENCOUNTER — Encounter: Payer: Self-pay | Admitting: Internal Medicine

## 2012-05-08 ENCOUNTER — Ambulatory Visit (INDEPENDENT_AMBULATORY_CARE_PROVIDER_SITE_OTHER): Payer: Medicare Other | Admitting: Internal Medicine

## 2012-05-08 VITALS — BP 120/62 | HR 50 | Temp 97.9°F | Ht 67.0 in | Wt 186.0 lb

## 2012-05-08 DIAGNOSIS — R0609 Other forms of dyspnea: Secondary | ICD-10-CM

## 2012-05-08 DIAGNOSIS — I2510 Atherosclerotic heart disease of native coronary artery without angina pectoris: Secondary | ICD-10-CM

## 2012-05-08 DIAGNOSIS — E785 Hyperlipidemia, unspecified: Secondary | ICD-10-CM

## 2012-05-08 DIAGNOSIS — Z0000 Encounter for general adult medical examination without abnormal findings: Secondary | ICD-10-CM

## 2012-05-08 DIAGNOSIS — Z23 Encounter for immunization: Secondary | ICD-10-CM

## 2012-05-08 LAB — BASIC METABOLIC PANEL
BUN: 22 mg/dL (ref 6–23)
Creatinine, Ser: 0.9 mg/dL (ref 0.4–1.5)
GFR: 84.72 mL/min (ref >60.00)
Potassium: 4.5 mEq/L (ref 3.5–5.1)

## 2012-05-08 LAB — LIPID PANEL
Cholesterol: 150 mg/dL (ref 0–200)
Triglycerides: 110 mg/dL (ref 0.0–149.0)

## 2012-05-08 LAB — CBC WITH DIFFERENTIAL/PLATELET
Eosinophils Relative: 1 % (ref 0.0–5.0)
HCT: 46.6 % (ref 39.0–52.0)
Monocytes Relative: 9.3 % (ref 3.0–12.0)
Neutrophils Relative %: 59.5 % (ref 43.0–77.0)
Platelets: 239 10*3/uL (ref 150.0–400.0)
WBC: 6.3 10*3/uL (ref 4.5–10.5)

## 2012-05-08 LAB — HEPATIC FUNCTION PANEL
AST: 23 U/L (ref 0–37)
Alkaline Phosphatase: 75 U/L (ref 39–117)
Total Bilirubin: 1 mg/dL (ref 0.3–1.2)

## 2012-05-08 NOTE — Assessment & Plan Note (Signed)
Stable status.

## 2012-05-08 NOTE — Progress Notes (Signed)
Subjective:    Patient ID: Jonathan Griffith, male    DOB: 09-27-1940, 71 y.o.   MRN: [id number]  HPI Here for physical  Still has DOE Works out regularly at [employer] No cough Occ gets a pain across his chest---"just a little hurt". Occasionally in stomach also Still gets a little pain walking up the hill  Occasional itching and burning in butt Discussed trying OTC cortisone cream  Current Outpatient Prescriptions on File Prior to Visit  Medication Sig Dispense Refill  . aspirin 81 MG tablet Take 81 mg by mouth daily.        Marland Kitchen losartan (COZAAR) 50 MG tablet Take 1 tablet (50 mg total) by mouth daily.  30 tablet  3  . nitroGLYCERIN (NITROSTAT) 0.4 MG SL tablet Place 1 tablet (0.4 mg total) under the tongue every 5 (five) minutes as needed for chest pain. Take as directed  25 tablet  3  . simvastatin (ZOCOR) 40 MG tablet Take 1 tablet (40 mg total) by mouth every evening.  90 tablet  3    Allergies  Allergen Reactions  . Ibuprofen     REACTION: ulcer    Past Medical History  Diagnosis Date  . Chest pain   . Allergic rhinitis   . GERD (gastroesophageal reflux disease)   . Hyperlipidemia   . Duodenal ulcer   . ED (erectile dysfunction)   . Osteoarthritis   . CAD (coronary artery disease)   . History of colonoscopy     Past Surgical History  Procedure Date  . Appendectomy 1964  . Hernia repair 1970    double hernia repair   . Retinal detachment surgery 2003  . Cardiac catheterization 03/2010    diffuse early disease  . Tonsillectomy     Family History  Problem Relation Age of Onset  . Cancer Mother     brain cancer  that was metastatic  from lung   . Coronary artery disease Mother     CABG in 55's  . Cancer Father     stomach cancer- 46  . Diabetes Sister   . Stroke Maternal Grandmother     History   Social History  . Marital Status: Married    Spouse Name: N/A    Number of Children: 2  . Years of Education: N/A   Occupational History  . Haematologist    retired   Social History Main Topics  . Smoking status: Former Smoker -- 1.0 packs/day for 10 years    Types: Cigars    Quit date: 07/18/2007  . Smokeless tobacco: Never Used   Comment: Occ cigar - no cigarettes - stopped in 6-09  . Alcohol Use: 0.5 oz/week    1 drink(s) per week     occ beer  . Drug Use: No  . Sexually Active: Not on file   Other Topics Concern  . Not on file   Social History Narrative   Has living willWife would serve as health care POA--then sonWould accept resuscitation attempts but no prolonged artificial ventilation Not sure about tube feeds   Review of Systems  Constitutional: Negative for fatigue and unexpected weight change.       Wears seat belt  HENT: Positive for congestion, rhinorrhea, sneezing and tinnitus. Negative for hearing loss and dental problem.        Occ tinnitus Full dentures--some troubles with fit Occ sneezing and rhinorrhea--no meds needed generally (occ uses allegra)  Eyes: Negative for visual disturbance.  Worsening cataract---some change in vision Sees Dr Binnie Kand second opinion at [facility]  Respiratory: Positive for shortness of breath. Negative for cough and chest tightness.   Cardiovascular: Positive for chest pain and palpitations. Negative for leg swelling.       Same CP Occ fluttering in chest  Gastrointestinal: Positive for abdominal pain. Negative for nausea, vomiting, constipation and blood in stool.       Occ brief epigastric pain--before or after eating Resolves on its own or better with some food (uses aleve 1-2 per week)  Genitourinary: Positive for urgency and frequency. Negative for difficulty urinating.       Episodic frequency and urgency---worse with coffee No sex--no problem  Musculoskeletal: Positive for myalgias and back pain. Negative for arthralgias.       Occ calf pain if he overdoes it Occ back pain---aleve takes care of it  Skin: Negative for rash.       No suspicious areas    Neurological: Positive for dizziness. Negative for syncope, weakness, light-headedness and numbness.       Occ dizziness if he gets up quickly  Hematological: Negative for adenopathy. Bruises/bleeds easily.  Psychiatric/Behavioral: Positive for disturbed wake/sleep cycle. Negative for dysphoric mood. The patient is not nervous/anxious.        Still doesn't sleep well--only a few hours initially then restless No sig daytime somnolence and no nap       Objective:   Physical Exam  Constitutional: He is oriented to person, place, and time. He appears well-developed and well-nourished. No distress.  HENT:  Head: Normocephalic and atraumatic.  Right Ear: External ear normal.  Left Ear: External ear normal.  Mouth/Throat: Oropharynx is clear and moist. No oropharyngeal exudate.  Eyes: Conjunctivae normal and EOM are normal. Pupils are equal, round, and reactive to light.  Neck: Normal range of motion. Neck supple. No thyromegaly present.  Cardiovascular: Regular rhythm, normal heart sounds and intact distal pulses.  Exam reveals no gallop.   No murmur heard.      bradycardic  Pulmonary/Chest: Effort normal and breath sounds normal. No respiratory distress. He has no wheezes. He has no rales.  Abdominal: Soft. There is no tenderness.  Musculoskeletal: Normal range of motion. He exhibits no edema and no tenderness.  Lymphadenopathy:    He has no cervical adenopathy.  Neurological: He is alert and oriented to person, place, and time.  Skin: No rash noted. No erythema.  Psychiatric: He has a normal mood and affect. His behavior is normal. Thought content normal.          Assessment & Plan:

## 2012-05-08 NOTE — Assessment & Plan Note (Signed)
Due for labs

## 2012-05-08 NOTE — Assessment & Plan Note (Signed)
Generally healthy Rx for zostavax Flu shot today No PSA due to age

## 2012-05-08 NOTE — Assessment & Plan Note (Signed)
Ongoing for years Doesn't seem ischemic though pattern is exertional NTG may help some---usually just rests Spirometry and past CXR normal ?could this be chronotropic----even if it is, symptoms don't currently warrant consideration of a pacer

## 2012-05-09 LAB — TSH: TSH: 0.94 u[IU]/mL (ref 0.35–5.50)

## 2012-05-13 ENCOUNTER — Encounter: Payer: Self-pay | Admitting: *Deleted

## 2012-05-22 ENCOUNTER — Ambulatory Visit: Payer: Medicare Other | Admitting: Cardiovascular Disease

## 2012-05-23 ENCOUNTER — Ambulatory Visit (INDEPENDENT_AMBULATORY_CARE_PROVIDER_SITE_OTHER): Payer: Medicare Other | Admitting: Cardiovascular Disease

## 2012-05-23 ENCOUNTER — Encounter: Payer: Self-pay | Admitting: Cardiovascular Disease

## 2012-05-23 ENCOUNTER — Encounter: Payer: Self-pay | Admitting: *Deleted

## 2012-05-23 VITALS — BP 122/64 | HR 54 | Ht 67.0 in | Wt 183.0 lb

## 2012-05-23 DIAGNOSIS — I10 Essential (primary) hypertension: Secondary | ICD-10-CM

## 2012-05-23 DIAGNOSIS — R079 Chest pain, unspecified: Secondary | ICD-10-CM

## 2012-05-23 DIAGNOSIS — I2510 Atherosclerotic heart disease of native coronary artery without angina pectoris: Secondary | ICD-10-CM

## 2012-05-23 LAB — BASIC METABOLIC PANEL
CO2: 29 mEq/L (ref 19–32)
Calcium: 9 mg/dL (ref 8.4–10.5)
Creatinine, Ser: 0.9 mg/dL (ref 0.4–1.5)
Glucose, Bld: 90 mg/dL (ref 70–99)

## 2012-05-23 LAB — CBC WITH DIFFERENTIAL/PLATELET
Basophils Absolute: 0 10*3/uL (ref 0.0–0.1)
Eosinophils Absolute: 0 10*3/uL (ref 0.0–0.7)
Hemoglobin: 14.9 g/dL (ref 13.0–17.0)
Lymphocytes Relative: 28.8 % (ref 12.0–46.0)
MCHC: 33.2 g/dL (ref 30.0–36.0)
Monocytes Absolute: 0.7 10*3/uL (ref 0.1–1.0)
Neutro Abs: 4.1 10*3/uL (ref 1.4–7.7)
Neutrophils Relative %: 59.5 % (ref 43.0–77.0)
RDW: 13.5 % (ref 11.5–14.6)

## 2012-05-23 MED ORDER — LOSARTAN POTASSIUM 50 MG PO TABS: 50.0000 mg | ORAL_TABLET | Freq: Every day | ORAL | Status: DC

## 2012-05-23 NOTE — Patient Instructions (Addendum)
Your physician recommends that you schedule a follow-up appointment in:  6 weeks.--December 19,2013 at 2:15  Your physician has requested that you have a cardiac catheterization. Cardiac catheterization is used to diagnose and/or treat various heart conditions. Doctors may recommend this procedure for a number of different reasons. The most common reason is to evaluate chest pain. Chest pain can be a symptom of coronary artery disease (CAD), and cardiac catheterization can show whether plaque is narrowing or blocking your heart's arteries. This procedure is also used to evaluate the valves, as well as measure the blood flow and oxygen levels in different parts of your heart. For further information please visit [email or web address] Please follow instruction sheet, as given. Scheduled for November 19,2013

## 2012-05-23 NOTE — Progress Notes (Signed)
 History of Present Illness: 71 yo WM with history of CAD, HLD, GERD who is here today for cardiac follow up. He has been followed in the past by Dr. Ross. His last cardiac cath 04/12/10 with Dr.Brodie showed 40% proximal LAD, 50% Mid to distal LAD, diffuse narrowing RCA. He has not tolerated Imdur in the past. He had bradycardia with Bystolic so it was stopped. Stress myoview February 2013 with no large areas of ischemia, LVEF=64%. I saw him for the first time 02/20/12 and he c/o dyspnea with minimal exertion. He had no chest pain but his energy level was down. No other complaints. He saw Dr. Letvak two weeks ago and spirometry was normal.    He is here for follow up. He continues to have chest pain with exertion and also dyspnea with exertion. No other complaints.   Primary Care Physician: Letvak   Last Lipid Profile:Lipid Panel     Component Value Date/Time   CHOL 150 05/08/2012 0958   TRIG 110.0 05/08/2012 0958   HDL 44.90 05/08/2012 0958   CHOLHDL 3 05/08/2012 0958   VLDL 22.0 05/08/2012 0958   LDLCALC 83 05/08/2012 0958     Past Medical History  Diagnosis Date  . Chest pain   . Allergic rhinitis   . GERD (gastroesophageal reflux disease)   . Hyperlipidemia   . Duodenal ulcer   . ED (erectile dysfunction)   . Osteoarthritis   . CAD (coronary artery disease)   . History of colonoscopy     Past Surgical History  Procedure Date  . Appendectomy 1964  . Hernia repair 1970    double hernia repair   . Retinal detachment surgery 2003  . Cardiac catheterization 03/2010    diffuse early disease  . Tonsillectomy     Current Outpatient Prescriptions  Medication Sig Dispense Refill  . aspirin 81 MG tablet Take 81 mg by mouth daily.        . losartan (COZAAR) 50 MG tablet Take 1 tablet (50 mg total) by mouth daily.  30 tablet  3  . nitroGLYCERIN (NITROSTAT) 0.4 MG SL tablet Place 1 tablet (0.4 mg total) under the tongue every 5 (five) minutes as needed for chest pain. Take as  directed  25 tablet  3  . simvastatin (ZOCOR) 40 MG tablet Take 1 tablet (40 mg total) by mouth every evening.  90 tablet  3    Allergies  Allergen Reactions  . Ibuprofen     REACTION: ulcer    History   Social History  . Marital Status: Married    Spouse Name: N/A    Number of Children: 2  . Years of Education: N/A   Occupational History  . Steel worker     retired   Social History Main Topics  . Smoking status: Former Smoker -- 1.0 packs/day for 10 years    Types: Cigars    Quit date: 07/18/2007  . Smokeless tobacco: Never Used     Comment: Occ cigar - no cigarettes - stopped in 6-09  . Alcohol Use: 0.5 oz/week    1 drink(s) per week     Comment: occ beer  . Drug Use: No  . Sexually Active: Not on file   Other Topics Concern  . Not on file   Social History Narrative   Has living willWife would serve as health care POA--then sonWould accept resuscitation attempts but no prolonged artificial ventilation Not sure about tube feeds    Family History  Problem   Relation Age of Onset  . Cancer Mother     brain cancer  that was metastatic  from lung   . Coronary artery disease Mother     CABG in 60's  . Cancer Father     stomach cancer- 59  . Diabetes Sister   . Stroke Maternal Grandmother     Review of Systems:  As stated in the HPI and otherwise negative.   BP 122/64  Pulse 54  Ht 5' 7" (1.702 m)  Wt 183 lb (83.008 kg)  BMI 28.66 kg/m2  Physical Examination: General: Well developed, well nourished, NAD HEENT: OP clear, mucus membranes moist SKIN: warm, dry. No rashes. Neuro: No focal deficits Musculoskeletal: Muscle strength 5/5 all ext Psychiatric: Mood and affect normal Neck: No JVD, no carotid bruits, no thyromegaly, no lymphadenopathy. Lungs:Clear bilaterally, no wheezes, rhonci, crackles Cardiovascular: Regular rate and rhythm. No murmurs, gallops or rubs. Abdomen:Soft. Bowel sounds present. Non-tender.  Extremities: No lower extremity edema.  Pulses are 2 + in the bilateral DP/PT.:  Assessment and Plan:   1. CORONARY ARTERY DISEASE:  He has dyspnea and chest pain concerning for angina. Recent PFTs with no evidence of lung disease.Will arrange right and left heart cath to assess PA pressures and exclude progression of cardiac disease.  Risks and benefits reviewed. Will arrange for 06/02/12 in outpatient cath lab. Labs today.   2. HTN: BP better controlled on Cozaar. F/U BMET ok.  No changes   

## 2012-06-04 ENCOUNTER — Inpatient Hospital Stay (HOSPITAL_BASED_OUTPATIENT_CLINIC_OR_DEPARTMENT_OTHER)
Admission: RE | Admit: 2012-06-04 | Discharge: 2012-06-04 | Disposition: A | Discharge status: Home / Self Care | Payer: Medicare Other | Source: Ambulatory Visit | Attending: Cardiovascular Disease | Admitting: Cardiovascular Disease

## 2012-06-04 ENCOUNTER — Encounter (HOSPITAL_BASED_OUTPATIENT_CLINIC_OR_DEPARTMENT_OTHER)
Admission: RE | Disposition: A | Discharge status: Home / Self Care | Payer: Self-pay | Source: Ambulatory Visit | Attending: Cardiovascular Disease

## 2012-06-04 DIAGNOSIS — R0989 Other specified symptoms and signs involving the circulatory and respiratory systems: Secondary | ICD-10-CM | POA: Insufficient documentation

## 2012-06-04 DIAGNOSIS — I2510 Atherosclerotic heart disease of native coronary artery without angina pectoris: Secondary | ICD-10-CM | POA: Insufficient documentation

## 2012-06-04 DIAGNOSIS — E785 Hyperlipidemia, unspecified: Secondary | ICD-10-CM | POA: Insufficient documentation

## 2012-06-04 DIAGNOSIS — R0609 Other forms of dyspnea: Secondary | ICD-10-CM | POA: Insufficient documentation

## 2012-06-04 DIAGNOSIS — R079 Chest pain, unspecified: Secondary | ICD-10-CM | POA: Insufficient documentation

## 2012-06-04 LAB — POCT I-STAT 3, ART BLOOD GAS (G3+)
Acid-base deficit: 1 mmol/L (ref 0.0–2.0)
O2 Saturation: 96 %

## 2012-06-04 LAB — POCT I-STAT 3, VENOUS BLOOD GAS (G3P V)
O2 Saturation: 68 %
pCO2, Ven: 47.4 mmHg (ref 45.0–50.0)
pH, Ven: 7.363 — ABNORMAL HIGH (ref 7.250–7.300)

## 2012-06-04 SURGERY — JV LEFT AND RIGHT HEART CATHETERIZATION WITH CORONARY ANGIOGRAM
Anesthesia: Moderate Sedation

## 2012-06-04 MED ORDER — ASPIRIN 81 MG PO CHEW
324.0000 mg | CHEWABLE_TABLET | ORAL | Status: AC
  Administered 2012-06-04: 324 mg via ORAL

## 2012-06-04 MED ORDER — ACETAMINOPHEN 325 MG PO TABS: 650.0000 mg | ORAL_TABLET | ORAL | Status: DC | PRN

## 2012-06-04 MED ORDER — SODIUM CHLORIDE 0.9 % IJ SOLN: 3.0000 mL | Freq: Two times a day (BID) | INTRAMUSCULAR | Status: DC

## 2012-06-04 MED ORDER — DIAZEPAM 5 MG PO TABS
5.0000 mg | ORAL_TABLET | ORAL | Status: AC
  Administered 2012-06-04: 5 mg via ORAL

## 2012-06-04 MED ORDER — SODIUM CHLORIDE 0.9 % IV SOLN: INTRAVENOUS | Status: DC

## 2012-06-04 MED ORDER — SODIUM CHLORIDE 0.9 % IV SOLN: INTRAVENOUS | Status: AC

## 2012-06-04 MED ORDER — SODIUM CHLORIDE 0.9 % IV SOLN: 250.0000 mL | INTRAVENOUS | Status: DC | PRN

## 2012-06-04 MED ORDER — SODIUM CHLORIDE 0.9 % IJ SOLN: 3.0000 mL | INTRAMUSCULAR | Status: DC | PRN

## 2012-06-04 NOTE — OR Nursing (Signed)
Discharge instructions reviewed and signed, pt stated understanding, ambulated in hall without difficulty, site level 0, transported to wife's car via wheelchair 

## 2012-06-04 NOTE — OR Nursing (Signed)
Meal served 

## 2012-06-04 NOTE — H&P (View-Only) (Signed)
History of Present Illness: 71 yo WM with history of CAD, HLD, GERD who is here today for cardiac follow up. He has been followed in the past by Dr. Tenny Craw. His last cardiac cath 04/12/10 with Dr.Brodie showed 40% proximal LAD, 50% Mid to distal LAD, diffuse narrowing RCA. He has not tolerated Imdur in the past. He had bradycardia with Bystolic so it was stopped. Stress myoview February 2013 with no large areas of ischemia, LVEF=64%. I saw him for the first time 02/20/12 and he c/o dyspnea with minimal exertion. He had no chest pain but his energy level was down. No other complaints. He saw Dr. Alphonsus Sias two weeks ago and spirometry was normal.    He is here for follow up. He continues to have chest pain with exertion and also dyspnea with exertion. No other complaints.   Primary Care Physician: Alphonsus Sias   Last Lipid Profile:Lipid Panel     Component Value Date/Time   CHOL 150 05/08/2012 0958   TRIG 110.0 05/08/2012 0958   HDL 44.90 05/08/2012 0958   CHOLHDL 3 05/08/2012 0958   VLDL 22.0 05/08/2012 0958   LDLCALC 83 05/08/2012 0958     Past Medical History  Diagnosis Date  . Chest pain   . Allergic rhinitis   . GERD (gastroesophageal reflux disease)   . Hyperlipidemia   . Duodenal ulcer   . ED (erectile dysfunction)   . Osteoarthritis   . CAD (coronary artery disease)   . History of colonoscopy     Past Surgical History  Procedure Date  . Appendectomy 1964  . Hernia repair 1970    double hernia repair   . Retinal detachment surgery 2003  . Cardiac catheterization 03/2010    diffuse early disease  . Tonsillectomy     Current Outpatient Prescriptions  Medication Sig Dispense Refill  . aspirin 81 MG tablet Take 81 mg by mouth daily.        Marland Kitchen losartan (COZAAR) 50 MG tablet Take 1 tablet (50 mg total) by mouth daily.  30 tablet  3  . nitroGLYCERIN (NITROSTAT) 0.4 MG SL tablet Place 1 tablet (0.4 mg total) under the tongue every 5 (five) minutes as needed for chest pain. Take as  directed  25 tablet  3  . simvastatin (ZOCOR) 40 MG tablet Take 1 tablet (40 mg total) by mouth every evening.  90 tablet  3    Allergies  Allergen Reactions  . Ibuprofen     REACTION: ulcer    History   Social History  . Marital Status: Married    Spouse Name: N/A    Number of Children: 2  . Years of Education: N/A   Occupational History  . Haematologist     retired   Social History Main Topics  . Smoking status: Former Smoker -- 1.0 packs/day for 10 years    Types: Cigars    Quit date: 07/18/2007  . Smokeless tobacco: Never Used     Comment: Occ cigar - no cigarettes - stopped in 6-09  . Alcohol Use: 0.5 oz/week    1 drink(s) per week     Comment: occ beer  . Drug Use: No  . Sexually Active: Not on file   Other Topics Concern  . Not on file   Social History Narrative   Has living willWife would serve as health care POA--then sonWould accept resuscitation attempts but no prolonged artificial ventilation Not sure about tube feeds    Family History  Problem  Relation Age of Onset  . Cancer Mother     brain cancer  that was metastatic  from lung   . Coronary artery disease Mother     CABG in 46's  . Cancer Father     stomach cancer- 81  . Diabetes Sister   . Stroke Maternal Grandmother     Review of Systems:  As stated in the HPI and otherwise negative.   BP 122/64  Pulse 54  Ht 5\' 7"  (1.702 m)  Wt 183 lb (83.008 kg)  BMI 28.66 kg/m2  Physical Examination: General: Well developed, well nourished, NAD HEENT: OP clear, mucus membranes moist SKIN: warm, dry. No rashes. Neuro: No focal deficits Musculoskeletal: Muscle strength 5/5 all ext Psychiatric: Mood and affect normal Neck: No JVD, no carotid bruits, no thyromegaly, no lymphadenopathy. Lungs:Clear bilaterally, no wheezes, rhonci, crackles Cardiovascular: Regular rate and rhythm. No murmurs, gallops or rubs. Abdomen:Soft. Bowel sounds present. Non-tender.  Extremities: No lower extremity edema.  Pulses are 2 + in the bilateral DP/PT.:  Assessment and Plan:   1. CORONARY ARTERY DISEASE:  He has dyspnea and chest pain concerning for angina. Recent PFTs with no evidence of lung disease.Will arrange right and left heart cath to assess PA pressures and exclude progression of cardiac disease.  Risks and benefits reviewed. Will arrange for 06/02/12 in outpatient cath lab. Labs today.   2. HTN: BP better controlled on Cozaar. F/U BMET ok.  No changes

## 2012-06-04 NOTE — CV Procedure (Signed)
    Cardiac Catheterization Operative Report  Jonathan Griffith [id number] 11/19/20138:06 AM Tillman Abide, MD  Procedure Performed:  1. Left Heart Catheterization 2. Selective Coronary Angiography 3. Right Heart Catheterization 4. Left ventricular angiogram  Operator: Verne Carrow, MD  Indication:   71 yo male with history of moderate non-obstructive CAD, hyperlipidemia with recent c/o dyspnea with exertion with normal spirometry, chest pain with exertion. Cardiac cath to exclude progression of CAD.                            Procedure Details: The risks, benefits, complications, treatment options, and expected outcomes were discussed with the patient. The patient and/or family concurred with the proposed plan, giving informed consent. The patient was brought to the cath lab after IV hydration was begun and oral premedication was given. The patient was further sedated with Versed and Fentanyl. The right groin was prepped and draped in the usual manner. Using the modified Seldinger access technique, a 4 French sheath was placed in the right femoral artery. A 6 French sheath was inserted into the right femoral vein. A mulit-purpose catheter was used to perform a right heart catheterization. Standard diagnostic catheters were used to perform selective coronary angiography. A pigtail catheter was used to perform a left ventricular angiogram. There were no immediate complications. The patient was taken to the recovery area in stable condition.   Hemodynamic Findings: Ao:  137/63              LV: 140/22/21 RA: 9               RV: 33/7/11 PA:  30/12 (mean 19) PCWP: 13  Fick Cardiac Output: 4.3 L/min Fick Cardiac Index: 2.2 L/min/m2 Central Aortic Saturation: 96% Pulmonary Artery Saturation: 68%   Angiographic Findings:  Left main: No obstructive disease.   Left Anterior Descending Artery: Moderate caliber vessel that courses to the apex. The proximal vessel has diffuse 30%  stenosis. The mid to distal vessel has a plaque just after the diagonal Principato which appears to be 50% following IC NTG. The distal LAD just beyond this lesion is 2.0 mm. The diagonal Pennel is very small and has no disease.   Circumflex Artery: Early intermediate Shiplett with mild luminal irregularities. The AV groove Circumflex is very small and has no disease.   Right Coronary Artery: Moderate caliber, dominant artery with 30% proximal stenosis. The remainder of the vessel is disease free.   Left Ventricular Angiogram: LVEF=60%.   Impression: 1. Moderate non-obstructive CAD 2. Normal LV systolic function 3. Normal PA pressures  Recommendations: Will continue medical management. He has not tolerated Imdur in the past. Will try low dose Imdur since his coronaries seem to be NTG responsive and this could relieve some of his dyspnea and chest pain.        Complications:  None; patient tolerated the procedure well.

## 2012-06-04 NOTE — Progress Notes (Signed)
Bedrest begins @ 0830.  Tegaderm dressing applied to right groin site.  Site level 0.

## 2012-06-04 NOTE — Interval H&P Note (Signed)
History and Physical Interval Note:  06/04/2012 7:16 AM  Jonathan Griffith  has presented today for cardiac cath with the diagnosis of chest pain and SOB with known CAD.  The various methods of treatment have been discussed with the patient and family. After consideration of risks, benefits and other options for treatment, the patient has consented to  Procedure(s) (LRB) with comments: JV LEFT AND RIGHT HEART CATHETERIZATION WITH CORONARY ANGIOGRAM (N/A) as a surgical intervention .  The patient's history has been reviewed, patient examined, no change in status, stable for surgery.  I have reviewed the patient's chart and labs.  Questions were answered to the patient's satisfaction.     ,

## 2012-07-04 ENCOUNTER — Encounter: Payer: Self-pay | Admitting: Cardiovascular Disease

## 2012-07-04 ENCOUNTER — Ambulatory Visit (INDEPENDENT_AMBULATORY_CARE_PROVIDER_SITE_OTHER): Payer: Medicare Other | Admitting: Cardiovascular Disease

## 2012-07-04 VITALS — BP 144/65 | HR 51 | Ht 67.0 in | Wt 190.0 lb

## 2012-07-04 DIAGNOSIS — I2510 Atherosclerotic heart disease of native coronary artery without angina pectoris: Secondary | ICD-10-CM

## 2012-07-04 MED ORDER — FUROSEMIDE 40 MG PO TABS: 40.0000 mg | ORAL_TABLET | Freq: Every day | ORAL | Status: DC

## 2012-07-04 NOTE — Progress Notes (Signed)
History of Present Illness: 71 yo WM with history of CAD, HLD, GERD who is here today for cardiac follow up. He has been followed in the past by Dr. Tenny Craw. His last cardiac cath 04/12/10 with Dr.Brodie showed 40% proximal LAD, 50% Mid to distal LAD, diffuse narrowing RCA. He has not tolerated Imdur in the past. He had bradycardia with Bystolic so it was stopped. Stress myoview February 2013 with no large areas of ischemia, LVEF=64%. I saw him for the first time 02/20/12 and he c/o dyspnea with minimal exertion. He had no chest pain but his energy level was down. No other complaints. He saw Dr. Alphonsus Sias November 2013 and spirometry was normal. Cardiac cath 06/04/12 with moderate stable CAD. He was started on Imdur.   He is here for follow up. He did not tolerate Imdur secondary to headaches.   Primary Care Physician: Alphonsus Sias   Last Lipid Profile:Lipid Panel     Component Value Date/Time   CHOL 150 05/08/2012 0958   TRIG 110.0 05/08/2012 0958   HDL 44.90 05/08/2012 0958   CHOLHDL 3 05/08/2012 0958   VLDL 22.0 05/08/2012 0958   LDLCALC 83 05/08/2012 0958     Past Medical History  Diagnosis Date  . Chest pain   . Allergic rhinitis   . GERD (gastroesophageal reflux disease)   . Hyperlipidemia   . Duodenal ulcer   . ED (erectile dysfunction)   . Osteoarthritis   . CAD (coronary artery disease)   . History of colonoscopy     Past Surgical History  Procedure Date  . Appendectomy 1964  . Hernia repair 1970    double hernia repair   . Retinal detachment surgery 2003  . Cardiac catheterization 03/2010    diffuse early disease  . Tonsillectomy     Current Outpatient Prescriptions  Medication Sig Dispense Refill  . aspirin 81 MG tablet Take 81 mg by mouth daily.        Marland Kitchen losartan (COZAAR) 50 MG tablet Take 1 tablet (50 mg total) by mouth daily.  90 tablet  3  . nitroGLYCERIN (NITROSTAT) 0.4 MG SL tablet Place 1 tablet (0.4 mg total) under the tongue every 5 (five) minutes as needed for  chest pain. Take as directed  25 tablet  3  . simvastatin (ZOCOR) 40 MG tablet Take 1 tablet (40 mg total) by mouth every evening.  90 tablet  3    Allergies  Allergen Reactions  . Ibuprofen     REACTION: ulcer    History   Social History  . Marital Status: Married    Spouse Name: N/A    Number of Children: 2  . Years of Education: N/A   Occupational History  . Haematologist     retired   Social History Main Topics  . Smoking status: Former Smoker -- 1.0 packs/day for 10 years    Types: Cigars    Quit date: 07/18/2007  . Smokeless tobacco: Never Used     Comment: Occ cigar - no cigarettes - stopped in 6-09  . Alcohol Use: 0.5 oz/week    1 drink(s) per week     Comment: occ beer  . Drug Use: No  . Sexually Active: Not on file   Other Topics Concern  . Not on file   Social History Narrative   Has living willWife would serve as health care POA--then sonWould accept resuscitation attempts but no prolonged artificial ventilation Not sure about tube feeds    Family History  Problem Relation Age of Onset  . Cancer Mother     brain cancer  that was metastatic  from lung   . Coronary artery disease Mother     CABG in 66's  . Cancer Father     stomach cancer- 73  . Diabetes Sister   . Stroke Maternal Grandmother     Review of Systems:  As stated in the HPI and otherwise negative.   BP 144/65  Pulse 51  Ht 5\' 7"  (1.702 m)  Wt 190 lb (86.183 kg)  BMI 29.76 kg/m2  Physical Examination: General: Well developed, well nourished, NAD HEENT: OP clear, mucus membranes moist SKIN: warm, dry. No rashes. Neuro: No focal deficits Musculoskeletal: Muscle strength 5/5 all ext Psychiatric: Mood and affect normal Neck: No JVD, no carotid bruits, no thyromegaly, no lymphadenopathy. Lungs:Clear bilaterally, no wheezes, rhonci, crackles Cardiovascular: Regular rate and rhythm. No murmurs, gallops or rubs. Abdomen:Soft. Bowel sounds present. Non-tender.  Extremities: No lower  extremity edema. Pulses are 2 + in the bilateral DP/PT.  Cardiac cath 06/04/12: Ao: 137/63  LV: 140/22/21  RA: 9  RV: 33/7/11  PA: 30/12 (mean 19)  PCWP: 13  Fick Cardiac Output: 4.3 L/min  Fick Cardiac Index: 2.2 L/min/m2  Central Aortic Saturation: 96%  Pulmonary Artery Saturation: 68%  Angiographic Findings:  Left main: No obstructive disease.  Left Anterior Descending Artery: Moderate caliber vessel that courses to the apex. The proximal vessel has diffuse 30% stenosis. The mid to distal vessel has a plaque just after the diagonal Bouvier which appears to be 50% following IC NTG. The distal LAD just beyond this lesion is 2.0 mm. The diagonal Ardolino is very small and has no disease.  Circumflex Artery: Early intermediate [place] with mild luminal irregularities. The AV groove Circumflex is very small and has no disease.  Right Coronary Artery: Moderate caliber, dominant artery with 30% proximal stenosis. The remainder of the vessel is disease free.  Left Ventricular Angiogram: LVEF=60%.    Assessment and Plan:   1. CAD: Stable. Moderate disease by cath 3 weeks ago. He did not tolerate Imdur secondary to headache. Continue ASA, statin, ARB. No beta blockers secondary to bradycardia. His LV filling pressures are elevated on cath, still dyspneic. Will try Lasix 40 mg po QDaily, eat banana every day, check daily weights and call back with weight, symptoms in 10 days.

## 2012-07-04 NOTE — Patient Instructions (Signed)
Your physician recommends that you schedule a follow-up appointment in:  3 months.   Your physician has recommended you make the following change in your medication: Start furosemide 40 mg by mouth daily

## 2012-07-18 ENCOUNTER — Telehealth: Payer: Self-pay | Admitting: Cardiovascular Disease

## 2012-07-18 DIAGNOSIS — I2510 Atherosclerotic heart disease of native coronary artery without angina pectoris: Secondary | ICD-10-CM

## 2012-07-18 MED ORDER — FUROSEMIDE 40 MG PO TABS: 40.0000 mg | ORAL_TABLET | Freq: Every day | ORAL | Status: DC

## 2012-07-18 NOTE — Telephone Encounter (Signed)
New Problem:    Patient called in wanting to report on how he is doing on his furosemide (LASIX) 40 MG tablet.  Please call back.

## 2012-07-18 NOTE — Telephone Encounter (Signed)
Spoke with pt and gave him instructions to continue Lasix.  Will send prescription to [facility] per pt's request.

## 2012-07-18 NOTE — Telephone Encounter (Signed)
Thanks. He should continue Lasix. He had a recent cath with stable CAD. His filling pressures were high. cdm

## 2012-07-18 NOTE — Telephone Encounter (Signed)
Spoke with pt. He was 190 lbs when here for office visit with Dr. Clifton L[place]leton on 12/19. Home weights as follows:                 12/20--185 lbs.                  12/21--184 lbs.                  12/22--180 lbs.                12/23--183 lbs                 12/24 182 lbs.                 12/25--185 lbs.  Weight has continued to remain at 185 lbs. No swelling. No shortness of breath. Does not notice much of a difference since Lasix started. He reports chest pain after he walks 10 minutes on his treadmill. Also has pain when walking up hill.  Goes away with rest. Able to do weights and ride bike without pain.  He is asking if he should continue Lasix.

## 2012-10-02 ENCOUNTER — Encounter: Payer: Self-pay | Admitting: Cardiovascular Disease

## 2012-10-02 ENCOUNTER — Ambulatory Visit (INDEPENDENT_AMBULATORY_CARE_PROVIDER_SITE_OTHER): Payer: Medicare Other | Admitting: Cardiovascular Disease

## 2012-10-02 VITALS — BP 113/63 | HR 50 | Ht 67.0 in | Wt 191.0 lb

## 2012-10-02 DIAGNOSIS — I2510 Atherosclerotic heart disease of native coronary artery without angina pectoris: Secondary | ICD-10-CM

## 2012-10-02 LAB — BASIC METABOLIC PANEL
CO2: 29 mEq/L (ref 19–32)
Chloride: 103 mEq/L (ref 96–112)
Glucose, Bld: 103 mg/dL — ABNORMAL HIGH (ref 70–99)

## 2012-10-02 NOTE — Progress Notes (Signed)
History of Present Illness: 72 yo WM with history of CAD, HLD, GERD who is here today for cardiac follow up. He has been followed in the past by Dr. Tenny Craw. His last cardiac cath 04/12/10 with Dr.Brodie showed 40% proximal LAD, 50% Mid to distal LAD, diffuse narrowing RCA. He has not tolerated Imdur in the past. He had bradycardia with Bystolic so it was stopped. Stress myoview February 2013 with no large areas of ischemia, LVEF=64%. I saw him for the first time 02/20/12 and he c/o dyspnea with minimal exertion. He had no chest pain but his energy level was down. No other complaints. He saw Dr. Alphonsus Sias November 2013 and spirometry was normal. Cardiac cath 06/04/12 with moderate stable CAD. He was started on Imdur but did not tolerate secondary to headaches. He was started on Lasix at the last visit secondary to elevated filling pressures on cath.   He is here today for follow up. No chest pain or SOB.   Primary Care Physician: Alphonsus Sias   Last Lipid Profile:Lipid Panel     Component Value Date/Time   CHOL 150 05/08/2012 0958   TRIG 110.0 05/08/2012 0958   HDL 44.90 05/08/2012 0958   CHOLHDL 3 05/08/2012 0958   VLDL 22.0 05/08/2012 0958   LDLCALC 83 05/08/2012 0958     Past Medical History  Diagnosis Date  . Chest pain   . Allergic rhinitis   . GERD (gastroesophageal reflux disease)   . Hyperlipidemia   . Duodenal ulcer   . ED (erectile dysfunction)   . Osteoarthritis   . CAD (coronary artery disease)   . History of colonoscopy     Past Surgical History  Procedure Laterality Date  . Appendectomy  1964  . Hernia repair  1970    double hernia repair   . Retinal detachment surgery  2003  . Cardiac catheterization  03/2010    diffuse early disease  . Tonsillectomy      Current Outpatient Prescriptions  Medication Sig Dispense Refill  . aspirin 81 MG tablet Take 81 mg by mouth daily.        . furosemide (LASIX) 40 MG tablet Take 1 tablet (40 mg total) by mouth daily.  90 tablet  3    . losartan (COZAAR) 50 MG tablet Take 1 tablet (50 mg total) by mouth daily.  90 tablet  3  . nitroGLYCERIN (NITROSTAT) 0.4 MG SL tablet Place 1 tablet (0.4 mg total) under the tongue every 5 (five) minutes as needed for chest pain. Take as directed  25 tablet  3  . simvastatin (ZOCOR) 40 MG tablet Take 1 tablet (40 mg total) by mouth every evening.  90 tablet  3   No current facility-administered medications for this visit.    Allergies  Allergen Reactions  . Ibuprofen     REACTION: ulcer    History   Social History  . Marital Status: Married    Spouse Name: N/A    Number of Children: 2  . Years of Education: N/A   Occupational History  . Haematologist     retired   Social History Main Topics  . Smoking status: Former Smoker -- 1.00 packs/day for 10 years    Types: Cigars    Quit date: 07/18/2007  . Smokeless tobacco: Never Used     Comment: Occ cigar - no cigarettes - stopped in 6-09  . Alcohol Use: 0.5 oz/week    1 drink(s) per week     Comment: occ  beer  . Drug Use: No  . Sexually Active: Not on file   Other Topics Concern  . Not on file   Social History Narrative   Has living will   Wife would serve as health care POA--then son   Would accept resuscitation attempts but no prolonged artificial ventilation    Not sure about tube feeds    Family History  Problem Relation Age of Onset  . Cancer Mother     brain cancer  that was metastatic  from lung   . Coronary artery disease Mother     CABG in 38's  . Cancer Father     stomach cancer- 41  . Diabetes Sister   . Stroke Maternal Grandmother     Review of Systems:  As stated in the HPI and otherwise negative.   BP 113/63  Pulse 50  Ht 5\' 7"  (1.702 m)  Wt 191 lb (86.637 kg)  BMI 29.91 kg/m2  Physical Examination: General: Well developed, well nourished, NAD HEENT: OP clear, mucus membranes moist SKIN: warm, dry. No rashes. Neuro: No focal deficits Musculoskeletal: Muscle strength 5/5 all  ext Psychiatric: Mood and affect normal Neck: No JVD, no carotid bruits, no thyromegaly, no lymphadenopathy. Lungs:Clear bilaterally, no wheezes, rhonci, crackles Cardiovascular: Regular rate and rhythm. No murmurs, gallops or rubs. Abdomen:Soft. Bowel sounds present. Non-tender.  Extremities: No lower extremity edema. Pulses are 2 + in the bilateral DP/PT.  Assessment and Plan:   1. CAD: Stable. Moderate disease by cath November 2013. He did not tolerate Imdur secondary to headache. Continue ASA, statin, ARB. No beta blockers secondary to bradycardia. His LV filling pressures were elevated on cath and Lasix was started. He is tolerating well. He is having stable anginal type pains at peak exercise. We have discussed Ranexa but he does not wish to try at this time. He does not tolerate Imdur. BP and lipids controlled.

## 2012-10-02 NOTE — Patient Instructions (Addendum)
Your physician wants you to follow-up in:  6 months. You will receive a reminder letter in the mail two months in advance. If you don't receive a letter, please call our office to schedule the follow-up appointment.   

## 2013-02-21 ENCOUNTER — Telehealth: Payer: Self-pay | Admitting: Cardiovascular Disease

## 2013-02-21 DIAGNOSIS — I2510 Atherosclerotic heart disease of native coronary artery without angina pectoris: Secondary | ICD-10-CM

## 2013-02-21 DIAGNOSIS — E785 Hyperlipidemia, unspecified: Secondary | ICD-10-CM

## 2013-02-21 MED ORDER — SIMVASTATIN 40 MG PO TABS: 40.0000 mg | ORAL_TABLET | Freq: Every evening | ORAL | Status: DC

## 2013-02-21 NOTE — Telephone Encounter (Signed)
Spoke with patient who states he needs a refill for Simvastatin.  Patient previously saw Dr. Tenny Craw who originally wrote this Rx and it has expired.  I refilled Simvastatin 40 mg one QD by Dr. Clifton Tlaloc and sent to [facility] per patient request.  Patient states he has enough medication to get him through until this arrives in the mail.  Patient expressed gratitude.

## 2013-02-21 NOTE — Telephone Encounter (Signed)
New Prob     Requesting a new prescription of SIMVASTATIN to [facility].

## 2013-03-03 ENCOUNTER — Ambulatory Visit (INDEPENDENT_AMBULATORY_CARE_PROVIDER_SITE_OTHER): Payer: Medicare Other | Admitting: Ophthalmology

## 2013-03-03 DIAGNOSIS — H33109 Unspecified retinoschisis, unspecified eye: Secondary | ICD-10-CM

## 2013-03-03 DIAGNOSIS — H33009 Unspecified retinal detachment with retinal break, unspecified eye: Secondary | ICD-10-CM

## 2013-03-03 DIAGNOSIS — H35379 Puckering of macula, unspecified eye: Secondary | ICD-10-CM

## 2013-03-03 DIAGNOSIS — H43819 Vitreous degeneration, unspecified eye: Secondary | ICD-10-CM

## 2013-04-08 ENCOUNTER — Ambulatory Visit (INDEPENDENT_AMBULATORY_CARE_PROVIDER_SITE_OTHER): Payer: Medicare Other | Admitting: Cardiovascular Disease

## 2013-04-08 ENCOUNTER — Telehealth: Payer: Self-pay | Admitting: Cardiovascular Disease

## 2013-04-08 ENCOUNTER — Encounter: Payer: Self-pay | Admitting: Cardiovascular Disease

## 2013-04-08 VITALS — BP 135/75 | HR 47 | Resp 12 | Wt 189.0 lb

## 2013-04-08 DIAGNOSIS — R001 Bradycardia, unspecified: Secondary | ICD-10-CM

## 2013-04-08 DIAGNOSIS — I10 Essential (primary) hypertension: Secondary | ICD-10-CM

## 2013-04-08 DIAGNOSIS — E785 Hyperlipidemia, unspecified: Secondary | ICD-10-CM

## 2013-04-08 DIAGNOSIS — G4700 Insomnia, unspecified: Secondary | ICD-10-CM

## 2013-04-08 DIAGNOSIS — I498 Other specified cardiac arrhythmias: Secondary | ICD-10-CM

## 2013-04-08 DIAGNOSIS — I2510 Atherosclerotic heart disease of native coronary artery without angina pectoris: Secondary | ICD-10-CM

## 2013-04-08 MED ORDER — LOSARTAN POTASSIUM 50 MG PO TABS: 50.0000 mg | ORAL_TABLET | Freq: Every day | ORAL | Status: DC

## 2013-04-08 MED ORDER — ZOLPIDEM TARTRATE 5 MG PO TABS: 5.0000 mg | ORAL_TABLET | Freq: Every evening | ORAL | Status: DC | PRN

## 2013-04-08 NOTE — Patient Instructions (Addendum)
Your physician wants you to follow-up in:  6 months. You will receive a reminder letter in the mail two months in advance. If you don't receive a letter, please call our office to schedule the follow-up appointment.   

## 2013-04-08 NOTE — Telephone Encounter (Signed)
Prescription for Cozaar has been sent to [facility] for 3 month supply. Pt notified

## 2013-04-08 NOTE — Telephone Encounter (Signed)
Follow Up  Pt wants to remind nurse Pat to set the Rx for 3 months//

## 2013-04-08 NOTE — Progress Notes (Signed)
History of Present Illness: 72 yo WM with history of CAD, HLD, GERD who is here today for cardiac follow up. He has been followed in the past by Dr. Tenny Craw. His last cardiac cath 04/12/10 with Dr.Brodie showed 40% proximal LAD, 50% Mid to distal LAD, diffuse narrowing RCA. He has not tolerated Imdur in the past. He had bradycardia with Bystolic so it was stopped. Stress myoview February 2013 with no large areas of ischemia, LVEF=64%. I saw him for the first time 02/20/12 and he c/o dyspnea with minimal exertion. He had no chest pain but his energy level was down. No other complaints. He saw Dr. Alphonsus Sias November 2013 and spirometry was normal. Cardiac cath 06/04/12 with moderate stable CAD. He was started on Imdur but did not tolerate secondary to headaches. He was started on Lasix secondary to elevated filling pressures on cath.   He is here today for follow up. No chest pain or SOB. Feeling well. Exercising 6 days per week. Working in yard. No dizziness. No LE edema.   Primary Care Physician: Alphonsus Sias   Last Lipid Profile:Lipid Panel     Component Value Date/Time   CHOL 150 05/08/2012 0958   TRIG 110.0 05/08/2012 0958   HDL 44.90 05/08/2012 0958   CHOLHDL 3 05/08/2012 0958   VLDL 22.0 05/08/2012 0958   LDLCALC 83 05/08/2012 0958     Past Medical History  Diagnosis Date  . Chest pain   . Allergic rhinitis   . GERD (gastroesophageal reflux disease)   . Hyperlipidemia   . Duodenal ulcer   . ED (erectile dysfunction)   . Osteoarthritis   . CAD (coronary artery disease)   . History of colonoscopy     Past Surgical History  Procedure Laterality Date  . Appendectomy  1964  . Hernia repair  1970    double hernia repair   . Retinal detachment surgery  2003  . Cardiac catheterization  03/2010    diffuse early disease  . Tonsillectomy      Current Outpatient Prescriptions  Medication Sig Dispense Refill  . aspirin 81 MG tablet Take 81 mg by mouth daily.        . furosemide (LASIX)  40 MG tablet Take 1 tablet (40 mg total) by mouth daily.  90 tablet  3  . losartan (COZAAR) 50 MG tablet Take 1 tablet (50 mg total) by mouth daily.  90 tablet  3  . nitroGLYCERIN (NITROSTAT) 0.4 MG SL tablet Place 1 tablet (0.4 mg total) under the tongue every 5 (five) minutes as needed for chest pain. Take as directed  25 tablet  3  . simvastatin (ZOCOR) 40 MG tablet Take 1 tablet (40 mg total) by mouth every evening.  90 tablet  4   No current facility-administered medications for this visit.    Allergies  Allergen Reactions  . Ibuprofen     REACTION: ulcer    History   Social History  . Marital Status: Married    Spouse Name: N/A    Number of Children: 2  . Years of Education: N/A   Occupational History  . Haematologist     retired   Social History Main Topics  . Smoking status: Former Smoker -- 1.00 packs/day for 10 years    Types: Cigars    Quit date: 07/18/2007  . Smokeless tobacco: Never Used     Comment: Occ cigar - no cigarettes - stopped in 6-09  . Alcohol Use: 0.5 oz/week  1 drink(s) per week     Comment: occ beer  . Drug Use: No  . Sexual Activity: Not on file   Other Topics Concern  . Not on file   Social History Narrative   Has living will   Wife would serve as health care POA--then son   Would accept resuscitation attempts but no prolonged artificial ventilation    Not sure about tube feeds    Family History  Problem Relation Age of Onset  . Cancer Mother     brain cancer  that was metastatic  from lung   . Coronary artery disease Mother     CABG in 59's  . Cancer Father     stomach cancer- 46  . Diabetes Sister   . Stroke Maternal Grandmother     Review of Systems:  As stated in the HPI and otherwise negative.   BP 135/75  Pulse 47  Wt 189 lb (85.73 kg)  BMI 29.59 kg/m2  Physical Examination: General: Well developed, well nourished, NAD HEENT: OP clear, mucus membranes moist SKIN: warm, dry. No rashes. Neuro: No focal  deficits Musculoskeletal: Muscle strength 5/5 all ext Psychiatric: Mood and affect normal Neck: No JVD, no carotid bruits, no thyromegaly, no lymphadenopathy. Lungs:Clear bilaterally, no wheezes, rhonci, crackles Cardiovascular: Huston Foley, regular rhythm. No murmurs, gallops or rubs. Abdomen:Soft. Bowel sounds present. Non-tender.  Extremities: No lower extremity edema. Pulses are 2 + in the bilateral DP/PT.  EKG: Sinus brady, rate 47 bpm. 1st degree AV block.   Assessment and Plan:   1. CAD: Stable. Moderate disease by cath November 2013. He did not tolerate Imdur secondary to headache. Continue ASA, statin, ARB. No beta blockers secondary to bradycardia. He is having stable anginal type pains at peak exercise. We have discussed Ranexa but he does not wish to try at this time. He does not tolerate Imdur.   2. HTN: BP controlled. Continue ARB.   3. Hyperlipidemia: He is on a statin. Lipids well controlled. Continue Zocor. He will have lipids and LFTs checked next month in primary care.   4. Insomnia: Will try Ambien 5 mg po QHS prn insomnia. Side effects reviewed  5. Bradycardia: Sinus. No dizziness. NO beta blocker.

## 2013-05-09 ENCOUNTER — Ambulatory Visit (INDEPENDENT_AMBULATORY_CARE_PROVIDER_SITE_OTHER): Payer: Medicare Other | Admitting: Internal Medicine

## 2013-05-09 ENCOUNTER — Encounter: Payer: Self-pay | Admitting: Internal Medicine

## 2013-05-09 VITALS — BP 120/62 | HR 52 | Temp 98.3°F | Ht 67.0 in | Wt 191.0 lb

## 2013-05-09 DIAGNOSIS — Z0000 Encounter for general adult medical examination without abnormal findings: Secondary | ICD-10-CM

## 2013-05-09 DIAGNOSIS — I2510 Atherosclerotic heart disease of native coronary artery without angina pectoris: Secondary | ICD-10-CM

## 2013-05-09 DIAGNOSIS — Z23 Encounter for immunization: Secondary | ICD-10-CM

## 2013-05-09 DIAGNOSIS — K219 Gastro-esophageal reflux disease without esophagitis: Secondary | ICD-10-CM | POA: Insufficient documentation

## 2013-05-09 DIAGNOSIS — E785 Hyperlipidemia, unspecified: Secondary | ICD-10-CM

## 2013-05-09 LAB — BASIC METABOLIC PANEL
Calcium: 9.2 mg/dL (ref 8.4–10.5)
GFR: 78.6 mL/min (ref >60.00)
Glucose, Bld: 94 mg/dL (ref 70–99)
Sodium: 139 mEq/L (ref 135–145)

## 2013-05-09 LAB — HEPATIC FUNCTION PANEL
Alkaline Phosphatase: 72 U/L (ref 39–117)
Bilirubin, Direct: 0.1 mg/dL (ref 0.0–0.3)
Total Protein: 6.9 g/dL (ref 6.0–8.3)

## 2013-05-09 LAB — LIPID PANEL
HDL: 43.7 mg/dL (ref >39.00)
LDL Cholesterol: 61 mg/dL (ref 0–99)
Total CHOL/HDL Ratio: 3
VLDL: 15 mg/dL (ref 0.0–40.0)

## 2013-05-09 LAB — CBC WITH DIFFERENTIAL/PLATELET
Basophils Absolute: 0 10*3/uL (ref 0.0–0.1)
Hemoglobin: 15.1 g/dL (ref 13.0–17.0)
Lymphocytes Relative: 26.7 % (ref 12.0–46.0)
Monocytes Relative: 11.2 % (ref 3.0–12.0)
Neutro Abs: 4.6 10*3/uL (ref 1.4–7.7)
RBC: 5.04 Mil/uL (ref 4.22–5.81)
RDW: 12.8 % (ref 11.5–14.6)
WBC: 7.6 10*3/uL (ref 4.5–10.5)

## 2013-05-09 MED ORDER — RANITIDINE HCL 150 MG PO CAPS: 150.0000 mg | ORAL_CAPSULE | Freq: Two times a day (BID) | ORAL | Status: DC

## 2013-05-09 NOTE — Assessment & Plan Note (Signed)
Ongoing DOE but still aggressive with his exercise Dr Clifton Andoni follows

## 2013-05-09 NOTE — Assessment & Plan Note (Signed)
Doing well overall No PSA due to age Colonoscopy due 2017 Flu shot today

## 2013-05-09 NOTE — Progress Notes (Signed)
Subjective:    Patient ID: Jonathan Griffith, male    DOB: 1941/07/06, 72 y.o.   MRN: [id number]  HPI Here for physical Reviewed advanced directives Reviewed wellness form  Continues to exercise vigorously Doesn't seem to have color changes with exercise Still has some DOE but no worse.  Happy with Dr Clifton Khyri  Has had some stinging or burning in stomach  Not associated with eating or exertion Gets choking sensation and bad taste while in bed Hasn't used any meds for this No swallowing problems  Current Outpatient Prescriptions on File Prior to Visit  Medication Sig Dispense Refill  . aspirin 81 MG tablet Take 81 mg by mouth daily.        . furosemide (LASIX) 40 MG tablet Take 1 tablet (40 mg total) by mouth daily.  90 tablet  3  . losartan (COZAAR) 50 MG tablet Take 1 tablet (50 mg total) by mouth daily.  90 tablet  3  . nitroGLYCERIN (NITROSTAT) 0.4 MG SL tablet Place 1 tablet (0.4 mg total) under the tongue every 5 (five) minutes as needed for chest pain. Take as directed  25 tablet  3  . simvastatin (ZOCOR) 40 MG tablet Take 1 tablet (40 mg total) by mouth every evening.  90 tablet  4   No current facility-administered medications on file prior to visit.    Allergies  Allergen Reactions  . Ibuprofen     REACTION: ulcer    Past Medical History  Diagnosis Date  . Chest pain   . Allergic rhinitis   . GERD (gastroesophageal reflux disease)   . Hyperlipidemia   . Duodenal ulcer   . ED (erectile dysfunction)   . Osteoarthritis   . CAD (coronary artery disease)   . History of colonoscopy     Past Surgical History  Procedure Laterality Date  . Appendectomy  1964  . Hernia repair  1970    double hernia repair   . Retinal detachment surgery  2003  . Cardiac catheterization  03/2010    diffuse early disease  . Tonsillectomy      Family History  Problem Relation Age of Onset  . Cancer Mother     brain cancer  that was metastatic  from lung   . Coronary artery  disease Mother     CABG in 8's  . Cancer Father     stomach cancer- 32  . Diabetes Sister   . Stroke Maternal Grandmother     History   Social History  . Marital Status: Married    Spouse Name: N/A    Number of Children: 2  . Years of Education: N/A   Occupational History  . Haematologist     retired   Social History Main Topics  . Smoking status: Former Smoker -- 1.00 packs/day for 10 years    Types: Cigars    Quit date: 07/18/2007  . Smokeless tobacco: Never Used     Comment: Occ cigar - no cigarettes - stopped in 6-09  . Alcohol Use: 0.5 oz/week    1 drink(s) per week     Comment: occ beer  . Drug Use: No  . Sexual Activity: Not on file   Other Topics Concern  . Not on file   Social History Narrative   Has living will   Wife would serve as health care POA--then son   Would accept resuscitation attempts but no prolonged artificial ventilation    No tube feeds if cognitively unaware  Review of Systems  Constitutional: Negative for fatigue and unexpected weight change.       Wears seat belt  HENT: Positive for hearing loss, sneezing and tinnitus. Negative for congestion, dental problem and rhinorrhea.        Full dentures  Eyes: Positive for visual disturbance.       More trouble with distance vision  Only 1 good eye  Respiratory: Positive for shortness of breath. Negative for cough and chest tightness.   Cardiovascular: Positive for chest pain. Negative for palpitations and leg swelling.  Gastrointestinal: Negative for nausea, vomiting, abdominal pain, constipation and blood in stool.  Endocrine: Negative for cold intolerance and heat intolerance.  Genitourinary: Negative for urgency, frequency and difficulty urinating.  Musculoskeletal: Positive for arthralgias. Negative for back pain and joint swelling.       Occasional knee pain---better now  Skin: Negative for rash.       Sore on left foot he wants checked  Allergic/Immunologic: Positive for  environmental allergies. Negative for immunocompromised state.       Mild allergy symptoms--no meds  Neurological: Positive for dizziness and headaches. Negative for syncope, weakness, light-headedness and numbness.       Occ mild orthostatic dizziness Occ headache  Hematological: Negative for adenopathy. Bruises/bleeds easily.  Psychiatric/Behavioral: Positive for sleep disturbance. Negative for dysphoric mood. The patient is not nervous/anxious.        Never a great sleeper--- intiates fine but then awakens frequently       Objective:   Physical Exam  Constitutional: He is oriented to person, place, and time. He appears well-developed and well-nourished. No distress.  HENT:  Head: Normocephalic and atraumatic.  Right Ear: External ear normal.  Left Ear: External ear normal.  Mouth/Throat: Oropharynx is clear and moist. No oropharyngeal exudate.  Eyes: Conjunctivae are normal. Pupils are equal, round, and reactive to light.  Left exotropia  Neck: Normal range of motion. Neck supple. No thyromegaly present.  Cardiovascular: Regular rhythm, normal heart sounds and intact distal pulses.  Exam reveals no gallop.   No murmur heard. Mild bradycardia  Pulmonary/Chest: Effort normal and breath sounds normal. No respiratory distress. He has no wheezes. He has no rales.  Abdominal: Soft. There is no tenderness.  Musculoskeletal: He exhibits no edema and no tenderness.  2-47mm ?cyst along lateral left midfoot  Lymphadenopathy:    He has no cervical adenopathy.  Neurological: He is alert and oriented to person, place, and time.  Skin: No rash noted. No erythema.  Psychiatric: He has a normal mood and affect. His behavior is normal.          Assessment & Plan:

## 2013-05-09 NOTE — Assessment & Plan Note (Signed)
Clear acid symptoms in throat and water brash Will try ranitidine---change to PPI if needed

## 2013-05-09 NOTE — Patient Instructions (Signed)
Please try the ranitidine 150mg  twice a day. If this doesn't stop the heartburn symptoms, please call.

## 2013-05-14 ENCOUNTER — Encounter: Payer: Self-pay | Admitting: *Deleted

## 2013-06-20 ENCOUNTER — Other Ambulatory Visit: Payer: Self-pay

## 2013-06-20 MED ORDER — NITROGLYCERIN 0.4 MG SL SUBL: 0.4000 mg | SUBLINGUAL_TABLET | SUBLINGUAL | Status: DC | PRN

## 2013-09-23 ENCOUNTER — Ambulatory Visit (INDEPENDENT_AMBULATORY_CARE_PROVIDER_SITE_OTHER): Payer: Medicare HMO | Admitting: Cardiovascular Disease

## 2013-09-23 ENCOUNTER — Telehealth: Payer: Self-pay | Admitting: *Deleted

## 2013-09-23 ENCOUNTER — Encounter: Payer: Self-pay | Admitting: Cardiovascular Disease

## 2013-09-23 VITALS — BP 130/70 | HR 51 | Ht 67.0 in | Wt 193.0 lb

## 2013-09-23 DIAGNOSIS — E785 Hyperlipidemia, unspecified: Secondary | ICD-10-CM

## 2013-09-23 DIAGNOSIS — I10 Essential (primary) hypertension: Secondary | ICD-10-CM

## 2013-09-23 DIAGNOSIS — R001 Bradycardia, unspecified: Secondary | ICD-10-CM

## 2013-09-23 DIAGNOSIS — I498 Other specified cardiac arrhythmias: Secondary | ICD-10-CM

## 2013-09-23 DIAGNOSIS — I2510 Atherosclerotic heart disease of native coronary artery without angina pectoris: Secondary | ICD-10-CM

## 2013-09-23 DIAGNOSIS — K219 Gastro-esophageal reflux disease without esophagitis: Secondary | ICD-10-CM

## 2013-09-23 MED ORDER — OMEPRAZOLE 20 MG PO CPDR: 20.0000 mg | DELAYED_RELEASE_CAPSULE | Freq: Every day | ORAL | Status: DC

## 2013-09-23 MED ORDER — RANOLAZINE ER 500 MG PO TB12: 500.0000 mg | ORAL_TABLET | Freq: Two times a day (BID) | ORAL | Status: DC

## 2013-09-23 MED ORDER — SIMVASTATIN 40 MG PO TABS: 20.0000 mg | ORAL_TABLET | Freq: Every evening | ORAL | Status: DC

## 2013-09-23 NOTE — Progress Notes (Signed)
History of Present Illness: 73 yo WM with history of CAD, HLD, GERD who is here today for cardiac follow up. He has been followed in the past by Dr. Harrington Challenger. His last cardiac cath 04/12/10 with Dr.Brodie showed 40% proximal LAD, 50% Mid to distal LAD, diffuse narrowing RCA. He has not tolerated Imdur in the past. He had bradycardia with Bystolic so it was stopped. Stress myoview February 2013 with no large areas of ischemia, LVEF=64%. I saw him for the first time 02/20/12 and he c/o dyspnea with minimal exertion. He had no chest pain but his energy level was down. No other complaints. He saw Dr. Silvio Pate November 2013 and spirometry was normal. Cardiac cath 06/04/12 with moderate stable CAD. He was started on Imdur but did not tolerate secondary to headaches. He was started on Lasix secondary to elevated filling pressures on cath.   He is here today for follow up. Still has mild chest pain at peak exertion. Some SOB. Feeling well. Exercising 6 days per week. Working in yard. No dizziness. No LE edema.   Primary Care Physician: Silvio Pate   Last Lipid Profile:Lipid Panel     Component Value Date/Time   CHOL 120 05/09/2013 0937   TRIG 75.0 05/09/2013 0937   HDL 43.70 05/09/2013 0937   CHOLHDL 3 05/09/2013 0937   VLDL 15.0 05/09/2013 0937   LDLCALC 61 05/09/2013 1610     Past Medical History  Diagnosis Date  . Chest pain   . Allergic rhinitis   . Hyperlipidemia   . Duodenal ulcer   . ED (erectile dysfunction)   . Osteoarthritis   . CAD (coronary artery disease)   . History of colonoscopy   . GERD (gastroesophageal reflux disease)     Past Surgical History  Procedure Laterality Date  . Appendectomy  1964  . Hernia repair  1970    double hernia repair   . Retinal detachment surgery  2003  . Cardiac catheterization  03/2010    diffuse early disease  . Tonsillectomy      Current Outpatient Prescriptions  Medication Sig Dispense Refill  . aspirin 81 MG tablet Take 81 mg by mouth  daily.        . furosemide (LASIX) 40 MG tablet Take 1 tablet (40 mg total) by mouth daily.  90 tablet  3  . losartan (COZAAR) 50 MG tablet Take 1 tablet (50 mg total) by mouth daily.  90 tablet  3  . nitroGLYCERIN (NITROSTAT) 0.4 MG SL tablet Place 1 tablet (0.4 mg total) under the tongue every 5 (five) minutes as needed for chest pain. Take as directed  25 tablet  3  . ranitidine (ZANTAC) 150 MG capsule Take 1 capsule (150 mg total) by mouth 2 (two) times daily.  180 capsule  3  . simvastatin (ZOCOR) 40 MG tablet Take 1 tablet (40 mg total) by mouth every evening.  90 tablet  4   No current facility-administered medications for this visit.    Allergies  Allergen Reactions  . Ibuprofen     REACTION: ulcer    History   Social History  . Marital Status: Married    Spouse Name: N/A    Number of Children: 2  . Years of Education: N/A   Occupational History  . Administrator, [employer] Service     retired   Social History Main Topics  . Smoking status: Former Smoker -- 1.00 packs/day for 10 years    Types: Cigars    Quit  date: 07/18/2007  . Smokeless tobacco: Never Used     Comment: Occ cigar - no cigarettes - stopped in 6-09  . Alcohol Use: 0.5 oz/week    1 drink(s) per week     Comment: occ beer  . Drug Use: No  . Sexual Activity: Not on file   Other Topics Concern  . Not on file   Social History Narrative   Has living will   Wife would serve as health care POA--then son   Would accept resuscitation attempts but no prolonged artificial ventilation    No tube feeds if cognitively unaware    Family History  Problem Relation Age of Onset  . Cancer Mother     brain cancer  that was metastatic  from lung   . Coronary artery disease Mother     CABG in 6's  . Cancer Father     stomach cancer- 62  . Diabetes Sister   . Stroke Maternal Grandmother     Review of Systems:  As stated in the HPI and otherwise negative.   BP 130/70  Pulse 51  Ht 5\' 7"  (1.702 m)  Wt 193 lb (87.544 kg)   BMI 30.22 kg/m2  SpO2 99%  Physical Examination: General: Well developed, well nourished, NAD HEENT: OP clear, mucus membranes moist SKIN: warm, dry. No rashes. Neuro: No focal deficits Musculoskeletal: Muscle strength 5/5 all ext Psychiatric: Mood and affect normal Neck: No JVD, no carotid bruits, no thyromegaly, no lymphadenopathy. Lungs:Clear bilaterally, no wheezes, rhonci, crackles Cardiovascular: Loletha Grayer, regular rhythm. No murmurs, gallops or rubs. Abdomen:Soft. Bowel sounds present. Non-tender.  Extremities: No lower extremity edema. Pulses are 2 + in the bilateral DP/PT.  Assessment and Plan:   1. CAD: Stable. Moderate disease by cath November 2013. He did not tolerate Imdur secondary to headache. NO beta blockers secondary to bradycardia. Continue ASA, statin, ARB. He is having stable anginal type pains at peak exercise. Will start Ranexa 500 mg po BID. Samples given for one month. If he gets beneft, will write for full supply. He does not tolerate Imdur.   2. HTN: BP controlled. Continue ARB.   3. Hyperlipidemia: He is on a statin. Lipids followed in primary care.   4. Bradycardia: Sinus. No dizziness. NO beta blocker.   5. GERD: Symptoms are worse. Will stop Zantac and start omeprazole 20 mg qdaily.

## 2013-09-23 NOTE — Patient Instructions (Signed)
Your physician wants you to follow-up in:  4 months. You will receive a reminder letter in the mail two months in advance. If you don't receive a letter, please call our office to schedule the follow-up appointment.  Your physician has recommended you make the following change in your medication:  Stop Zantac.  Start Prilosec (Omeprazole) 20 mg by mouth daily. Start Ranexa 500 mg by mouth twice daily.  Call us if you would like [place] to send this prescription to your pharmacy.

## 2013-09-23 NOTE — Telephone Encounter (Signed)
Pt on simvastatin 40 mg by mouth daily. Reviewed with Dr. Angelena Form and with start of Ranexa pt should decrease Simvastatin to 20 mg by mouth daily.  If pt decides to continue on Ranexa will discuss changing statins at that time. I spoke with pt and gave him this information. He will break 40 mg tablets of simvastatin in half.

## 2014-01-23 ENCOUNTER — Ambulatory Visit: Payer: Medicare HMO | Admitting: Cardiovascular Disease

## 2014-02-25 ENCOUNTER — Ambulatory Visit (INDEPENDENT_AMBULATORY_CARE_PROVIDER_SITE_OTHER): Payer: Medicare HMO | Admitting: Cardiovascular Disease

## 2014-02-25 ENCOUNTER — Encounter: Payer: Self-pay | Admitting: Cardiovascular Disease

## 2014-02-25 VITALS — BP 120/62 | HR 51 | Ht 66.0 in | Wt 189.0 lb

## 2014-02-25 DIAGNOSIS — I10 Essential (primary) hypertension: Secondary | ICD-10-CM

## 2014-02-25 DIAGNOSIS — E785 Hyperlipidemia, unspecified: Secondary | ICD-10-CM

## 2014-02-25 DIAGNOSIS — I498 Other specified cardiac arrhythmias: Secondary | ICD-10-CM

## 2014-02-25 DIAGNOSIS — R001 Bradycardia, unspecified: Secondary | ICD-10-CM

## 2014-02-25 DIAGNOSIS — I2510 Atherosclerotic heart disease of native coronary artery without angina pectoris: Secondary | ICD-10-CM

## 2014-02-25 NOTE — Patient Instructions (Addendum)
Your physician wants you to follow-up in:  6 months. You will receive a reminder letter in the mail two months in advance. If you don't receive a letter, please call our office to schedule the follow-up appointment.   

## 2014-02-25 NOTE — Progress Notes (Signed)
History of Present Illness: 73 yo WM with history of CAD, HLD, GERD who is here today for cardiac follow up. He has been followed in the past by Dr. Harrington Challenger. His last cardiac cath 04/12/10 with Dr.Brodie showed 40% proximal LAD, 50% Mid to distal LAD, diffuse narrowing RCA. He has not tolerated Imdur in the past. He had bradycardia with Bystolic so it was stopped. Stress myoview February 2013 with no large areas of ischemia, LVEF=64%. I saw him for the first time 02/20/12 and he c/o dyspnea with minimal exertion. He had no chest pain but his energy level was down. No other complaints. He saw Dr. Silvio Pate November 2013 and spirometry was normal. Cardiac cath 06/04/12 with moderate stable CAD. He was started on Imdur but did not tolerate secondary to headaches. He was started on Lasix secondary to elevated filling pressures on cath. Trial of Ranexa after visit here in March 2015.   He is here today for follow up. Still has mild chest pain at peak exertion but exercising every day. He rides the stationary bike 4 miles at the gym, walks 30 minutes per day. Feeling well. Working in yard. No dizziness. No LE edema.   Primary Care Physician: Silvio Pate   Last Lipid Profile:Lipid Panel     Component Value Date/Time   CHOL 120 05/09/2013 0937   TRIG 75.0 05/09/2013 0937   HDL 43.70 05/09/2013 0937   CHOLHDL 3 05/09/2013 0937   VLDL 15.0 05/09/2013 0937   LDLCALC 61 05/09/2013 0867     Past Medical History  Diagnosis Date  . Chest pain   . Allergic rhinitis   . Hyperlipidemia   . Duodenal ulcer   . ED (erectile dysfunction)   . Osteoarthritis   . CAD (coronary artery disease)   . History of colonoscopy   . GERD (gastroesophageal reflux disease)     Past Surgical History  Procedure Laterality Date  . Appendectomy  1964  . Hernia repair  1970    double hernia repair   . Retinal detachment surgery  2003  . Cardiac catheterization  03/2010    diffuse early disease  . Tonsillectomy       Current Outpatient Prescriptions  Medication Sig Dispense Refill  . aspirin 81 MG tablet Take 81 mg by mouth daily.        . furosemide (LASIX) 40 MG tablet Take 1 tablet (40 mg total) by mouth daily.  90 tablet  3  . losartan (COZAAR) 50 MG tablet Take 1 tablet (50 mg total) by mouth daily.  90 tablet  3  . nitroGLYCERIN (NITROSTAT) 0.4 MG SL tablet Place 1 tablet (0.4 mg total) under the tongue every 5 (five) minutes as needed for chest pain. Take as directed  25 tablet  3  . omeprazole (PRILOSEC) 20 MG capsule Take 1 capsule (20 mg total) by mouth daily.  30 capsule  11  . ranolazine (RANEXA) 500 MG 12 hr tablet Take 1 tablet (500 mg total) by mouth 2 (two) times daily.  60 tablet  3  . simvastatin (ZOCOR) 40 MG tablet Take 0.5 tablets (20 mg total) by mouth every evening.  90 tablet  4   No current facility-administered medications for this visit.    Allergies  Allergen Reactions  . Ibuprofen     REACTION: ulcer    History   Social History  . Marital Status: Married    Spouse Name: N/A    Number of Children: 2  .  Years of Education: N/A   Occupational History  . Administrator, [employer] Service     retired   Social History Main Topics  . Smoking status: Former Smoker -- 1.00 packs/day for 10 years    Types: Cigars    Quit date: 07/18/2007  . Smokeless tobacco: Never Used     Comment: Occ cigar - no cigarettes - stopped in 6-09  . Alcohol Use: 0.5 oz/week    1 drink(s) per week     Comment: occ beer  . Drug Use: No  . Sexual Activity: Not on file   Other Topics Concern  . Not on file   Social History Narrative   Has living will   Wife would serve as health care POA--then son   Would accept resuscitation attempts but no prolonged artificial ventilation    No tube feeds if cognitively unaware    Family History  Problem Relation Age of Onset  . Cancer Mother     brain cancer  that was metastatic  from lung   . Coronary artery disease Mother     CABG in 61's  . Cancer  Father     stomach cancer- 53  . Diabetes Sister   . Stroke Maternal Grandmother     Review of Systems:  As stated in the HPI and otherwise negative.   BP 120/62  Pulse 51  Ht 5\' 6"  (1.676 m)  Wt 189 lb (85.73 kg)  BMI 30.52 kg/m2  Physical Examination: General: Well developed, well nourished, NAD HEENT: OP clear, mucus membranes moist SKIN: warm, dry. No rashes. Neuro: No focal deficits Musculoskeletal: Muscle strength 5/5 all ext Psychiatric: Mood and affect normal Neck: No JVD, no carotid bruits, no thyromegaly, no lymphadenopathy. Lungs:Clear bilaterally, no wheezes, rhonci, crackles Cardiovascular: Loletha Grayer, regular rhythm. No murmurs, gallops or rubs. Abdomen:Soft. Bowel sounds present. Non-tender.  Extremities: No lower extremity edema. Pulses are 2 + in the bilateral DP/PT.  Assessment and Plan:   1. CAD: Stable. Moderate disease by cath November 2013. He did not tolerate Imdur secondary to headache. NO beta blockers secondary to bradycardia. Continue ASA, statin, ARB. He is having stable anginal type pains at peak exercise, no changes. No improvement with Ranexa. He does not tolerate Imdur.   2. HTN: BP controlled. Continue ARB.   3. Hyperlipidemia: He is on a statin. Lipids followed in primary care.   4. Bradycardia: Sinus. No dizziness. NO beta blocker.

## 2014-03-05 ENCOUNTER — Ambulatory Visit (INDEPENDENT_AMBULATORY_CARE_PROVIDER_SITE_OTHER): Payer: Medicare HMO | Admitting: Ophthalmology

## 2014-03-05 DIAGNOSIS — H35379 Puckering of macula, unspecified eye: Secondary | ICD-10-CM

## 2014-03-05 DIAGNOSIS — H33109 Unspecified retinoschisis, unspecified eye: Secondary | ICD-10-CM

## 2014-03-05 DIAGNOSIS — I10 Essential (primary) hypertension: Secondary | ICD-10-CM

## 2014-03-05 DIAGNOSIS — H35039 Hypertensive retinopathy, unspecified eye: Secondary | ICD-10-CM

## 2014-03-05 DIAGNOSIS — H33009 Unspecified retinal detachment with retinal break, unspecified eye: Secondary | ICD-10-CM

## 2014-03-05 DIAGNOSIS — H43819 Vitreous degeneration, unspecified eye: Secondary | ICD-10-CM

## 2014-03-06 ENCOUNTER — Ambulatory Visit (INDEPENDENT_AMBULATORY_CARE_PROVIDER_SITE_OTHER): Payer: Medicare HMO | Admitting: Podiatry

## 2014-03-06 ENCOUNTER — Ambulatory Visit (INDEPENDENT_AMBULATORY_CARE_PROVIDER_SITE_OTHER): Payer: Medicare HMO

## 2014-03-06 ENCOUNTER — Encounter: Payer: Self-pay | Admitting: Podiatry

## 2014-03-06 VITALS — BP 130/67 | HR 49 | Resp 17

## 2014-03-06 DIAGNOSIS — M79672 Pain in left foot: Secondary | ICD-10-CM

## 2014-03-06 DIAGNOSIS — M79609 Pain in unspecified limb: Secondary | ICD-10-CM

## 2014-03-06 DIAGNOSIS — L84 Corns and callosities: Secondary | ICD-10-CM

## 2014-03-06 DIAGNOSIS — M79662 Pain in left lower leg: Secondary | ICD-10-CM

## 2014-03-06 NOTE — Progress Notes (Signed)
   Subjective:    Patient ID: Jonathan Griffith, male    DOB: May 11, 1941, 73 y.o.   MRN: [id number]  HPI Pt presents with left foot pain, tender knot on the outer left foot, has been ongoing for 1 year and worsening in the past 2 months. Doesn't bother him much when walking, only if he is wearing tight shoes or other forms of pressure. Denies any systemic complaints such as fevers, chills, nausea, vomiting. No history of trauma to the area. No other complaints at this time.   Review of Systems  Eyes: Positive for visual disturbance.  All other systems reviewed and are negative.      Objective:   Physical Exam  AAO x3, NAD DP/PT pulses palpable b/l. CRT < 3 sec.  MMT 5/5, ROM WNL Hyperkeratotic lesion over the left fifth metatarsal base laterally. There is mild tenderness to palpation directly over the hyperkeratotic lesion but not elsewhere on the fifth metatarsal base. No pain along the course of the peroneal tendon or on the insertion of the fifth metatarsal base. Range of motion within normal limits. There is no evidence of soft tissue mass at this time.      Assessment & Plan:  73 year old male left fifth metatarsal base hyperkeratotic lesion -X-Rays were obtained. The x-ray report for full details. No acute fracture.  -Both conservative and surgical intervention was discussed with the patient in detail including alternatives, risks, complications -Hyperkeratotic lesion sharply debrided without complication. -Offloading pads dispensed the patient. -Patient followup as needed or to call if any symptoms worsened or changed -f/u with PCP for issues stated in ROS, no acute changes.

## 2014-03-12 ENCOUNTER — Other Ambulatory Visit: Payer: Self-pay | Admitting: Cardiovascular Disease

## 2014-04-08 ENCOUNTER — Telehealth: Payer: Self-pay | Admitting: Internal Medicine

## 2014-04-08 MED ORDER — RANITIDINE HCL 150 MG PO TABS: 150.0000 mg | ORAL_TABLET | Freq: Two times a day (BID) | ORAL | Status: DC

## 2014-04-08 NOTE — Telephone Encounter (Signed)
Pt called stating Ranitidine 150 mg capsule is $54 for 3 months and if pt goes to [facility] he can get medication for $10 for 3 month supply but has to be regular pill form. Pt would like to continue medication in regular pill form. Please send to [facility] on [facility] [facility].

## 2014-04-08 NOTE — Telephone Encounter (Signed)
rx sent to pharmacy by e-script Spoke with patient and advised results   

## 2014-04-08 NOTE — Telephone Encounter (Signed)
Okay to refill for a year as he is requesting

## 2014-05-02 ENCOUNTER — Other Ambulatory Visit: Payer: Self-pay | Admitting: Cardiovascular Disease

## 2014-05-04 ENCOUNTER — Other Ambulatory Visit: Payer: Self-pay

## 2014-05-04 MED ORDER — ZOLPIDEM TARTRATE 5 MG PO TABS: 5.0000 mg | ORAL_TABLET | Freq: Every evening | ORAL | Status: DC | PRN

## 2014-05-04 NOTE — Telephone Encounter (Signed)
OK to refill. Thanks, chris 

## 2014-05-07 ENCOUNTER — Telehealth: Payer: Self-pay | Admitting: *Deleted

## 2014-05-07 NOTE — Telephone Encounter (Signed)
Spoke with pt's insurance and prior auth done for Zolpidem. Approval received through 07/17/15

## 2014-05-13 ENCOUNTER — Encounter: Payer: Self-pay | Admitting: Internal Medicine

## 2014-05-13 ENCOUNTER — Ambulatory Visit (INDEPENDENT_AMBULATORY_CARE_PROVIDER_SITE_OTHER): Payer: Medicare HMO | Admitting: Internal Medicine

## 2014-05-13 VITALS — BP 120/60 | HR 65 | Temp 97.7°F | Ht 66.0 in | Wt 188.0 lb

## 2014-05-13 DIAGNOSIS — I25119 Atherosclerotic heart disease of native coronary artery with unspecified angina pectoris: Secondary | ICD-10-CM

## 2014-05-13 DIAGNOSIS — Z0000 Encounter for general adult medical examination without abnormal findings: Secondary | ICD-10-CM

## 2014-05-13 DIAGNOSIS — K219 Gastro-esophageal reflux disease without esophagitis: Secondary | ICD-10-CM

## 2014-05-13 DIAGNOSIS — Z23 Encounter for immunization: Secondary | ICD-10-CM

## 2014-05-13 DIAGNOSIS — E785 Hyperlipidemia, unspecified: Secondary | ICD-10-CM

## 2014-05-13 DIAGNOSIS — Z7189 Other specified counseling: Secondary | ICD-10-CM | POA: Insufficient documentation

## 2014-05-13 LAB — LIPID PANEL
CHOLESTEROL: 136 mg/dL (ref 0–200)
HDL: 47.4 mg/dL (ref >39.00)
LDL Cholesterol: 69 mg/dL (ref 0–99)
NONHDL: 88.6
Total CHOL/HDL Ratio: 3
Triglycerides: 97 mg/dL (ref 0.0–149.0)
VLDL: 19.4 mg/dL (ref 0.0–40.0)

## 2014-05-13 LAB — COMPREHENSIVE METABOLIC PANEL
ALBUMIN: 3.8 g/dL (ref 3.5–5.2)
ALK PHOS: 77 U/L (ref 39–117)
ALT: 14 U/L (ref 0–53)
AST: 21 U/L (ref 0–37)
BILIRUBIN TOTAL: 0.9 mg/dL (ref 0.2–1.2)
BUN: 23 mg/dL (ref 6–23)
CO2: 24 mEq/L (ref 19–32)
Calcium: 9.5 mg/dL (ref 8.4–10.5)
Chloride: 103 mEq/L (ref 96–112)
Creatinine, Ser: 1.1 mg/dL (ref 0.4–1.5)
GFR: 73.24 mL/min (ref >60.00)
GLUCOSE: 94 mg/dL (ref 70–99)
POTASSIUM: 4.6 meq/L (ref 3.5–5.1)
SODIUM: 138 meq/L (ref 135–145)
TOTAL PROTEIN: 6.9 g/dL (ref 6.0–8.3)

## 2014-05-13 LAB — CBC WITH DIFFERENTIAL/PLATELET
BASOS ABS: 0 10*3/uL (ref 0.0–0.1)
Basophils Relative: 0.4 % (ref 0.0–3.0)
EOS ABS: 0.1 10*3/uL (ref 0.0–0.7)
Eosinophils Relative: 1.2 % (ref 0.0–5.0)
HEMATOCRIT: 47.7 % (ref 39.0–52.0)
Hemoglobin: 15.7 g/dL (ref 13.0–17.0)
LYMPHS ABS: 2.3 10*3/uL (ref 0.7–4.0)
Lymphocytes Relative: 27.1 % (ref 12.0–46.0)
MCHC: 33 g/dL (ref 30.0–36.0)
MCV: 90.4 fl (ref 78.0–100.0)
MONO ABS: 0.8 10*3/uL (ref 0.1–1.0)
Monocytes Relative: 9.8 % (ref 3.0–12.0)
NEUTROS PCT: 61.5 % (ref 43.0–77.0)
Neutro Abs: 5.2 10*3/uL (ref 1.4–7.7)
PLATELETS: 229 10*3/uL (ref 150.0–400.0)
RBC: 5.28 Mil/uL (ref 4.22–5.81)
RDW: 13.6 % (ref 11.5–15.5)
WBC: 8.4 10*3/uL (ref 4.0–10.5)

## 2014-05-13 LAB — T4, FREE: FREE T4: 1.12 ng/dL (ref 0.60–1.60)

## 2014-05-13 NOTE — Assessment & Plan Note (Signed)
I have personally reviewed the Medicare Annual Wellness questionnaire and have noted 1. The patient's medical and social history 2. Their use of alcohol, tobacco or illicit drugs 3. Their current medications and supplements 4. The patient's functional ability including ADL's, fall risks, home safety risks and hearing or visual             impairment. 5. Diet and physical activities 6. Evidence for depression or mood disorders  The patients weight, height, BMI and visual acuity have been recorded in the chart I have made referrals, counseling and provided education to the patient based review of the above and I have provided the pt with a written personalized care plan for preventive services.  I have provided you with a copy of your personalized plan for preventive services. Please take the time to review along with your updated medication list.  Doing well Colonoscopy due 2017--but he doesn't want any more No PSA due to age Flu and prevnar today Exercises daily and eats healthy

## 2014-05-13 NOTE — Progress Notes (Signed)
Pre visit review using our clinic review tool, if applicable. No additional management support is needed unless otherwise documented below in the visit note. 

## 2014-05-13 NOTE — Patient Instructions (Signed)
If the acid symptoms continue after you have restarted the ranitidine regularly--- you should then change to omeprazole 20mg  daily on an empty stomach (this is also over the counter).

## 2014-05-13 NOTE — Assessment & Plan Note (Signed)
No problems with statin Due for labs 

## 2014-05-13 NOTE — Assessment & Plan Note (Signed)
See social history 

## 2014-05-13 NOTE — Addendum Note (Signed)
Addended by: Despina Hidden on: 05/13/2014 10:20 AM   Modules accepted: Orders

## 2014-05-13 NOTE — Progress Notes (Signed)
Subjective:    Patient ID: Jonathan Griffith, male    DOB: 07/05/1941, 73 y.o.   MRN: [id number]  HPI Here for Medicare wellness visit and follow up Reviewed form and advanced directives Reviewed other doctors No tobacco 0-2 drinks daily---likes bourbon and coke No falls No depression or anhedonia Vision okay Some hearing loss but not too bad Independent in instrumental ADLs No cognitive issues  Heart is doing well Still gets some DOE when he walks up a hill--will get chest pain also. This quickly resolves with rest. Hasn't needed nitro Exercises regularly and no change in exercise tolerance No dizziness or syncope No sig edema  Still gets water brash at night Doesn't eat after supper Has raised the [facility] Ran out of ranitidine and had been off for a while---now has restarted it Swallows okay  Still on statin No myalgias  No arthritis pain lately  Has allergy issues---sneezing No meds needed No persistent issues  Current Outpatient Prescriptions on File Prior to Visit  Medication Sig Dispense Refill  . aspirin 81 MG tablet Take 81 mg by mouth daily.        . furosemide (LASIX) 40 MG tablet Take 1 tablet (40 mg total) by mouth daily.  90 tablet  3  . nitroGLYCERIN (NITROSTAT) 0.4 MG SL tablet Place 1 tablet (0.4 mg total) under the tongue every 5 (five) minutes as needed for chest pain. Take as directed  25 tablet  3  . ranitidine (ZANTAC) 150 MG tablet Take 1 tablet (150 mg total) by mouth 2 (two) times daily.  180 tablet  3  . simvastatin (ZOCOR) 40 MG tablet TAKE 1 TABLET BY MOUTH EVERY EVENING  90 tablet  0  . zolpidem (AMBIEN) 5 MG tablet TAKE 1 TABLET BY MOUTH AT BEDTIME AS NEEDED FOR SLEEP  30 tablet  3   No current facility-administered medications on file prior to visit.    Allergies  Allergen Reactions  . Ibuprofen     REACTION: ulcer    Past Medical History  Diagnosis Date  . Chest pain   . Allergic rhinitis   . Hyperlipidemia   . Duodenal ulcer   .  ED (erectile dysfunction)   . Osteoarthritis   . CAD (coronary artery disease)   . History of colonoscopy   . GERD (gastroesophageal reflux disease)     Past Surgical History  Procedure Laterality Date  . Appendectomy  1964  . Hernia repair  1970    double hernia repair   . Retinal detachment surgery  2003  . Cardiac catheterization  03/2010    diffuse early disease  . Tonsillectomy      Family History  Problem Relation Age of Onset  . Cancer Mother     brain cancer  that was metastatic  from lung   . Coronary artery disease Mother     CABG in 5's  . Cancer Father     stomach cancer- 102  . Diabetes Sister   . Stroke Maternal Grandmother     History   Social History  . Marital Status: Married    Spouse Name: N/A    Number of Children: 2  . Years of Education: N/A   Occupational History  . Administrator, Civil Service     retired   Social History Main Topics  . Smoking status: Former Smoker -- 1.00 packs/day for 10 years    Types: Cigars    Quit date: 07/18/2007  . Smokeless tobacco: Never Used  Comment: Occ cigar - no cigarettes - stopped in 6-09  . Alcohol Use: 0.5 oz/week    1 drink(s) per week     Comment: occ beer  . Drug Use: No  . Sexual Activity: Not on file   Other Topics Concern  . Not on file   Social History Narrative   Has living will   Wife would serve as health care POA--then son   Would accept resuscitation attempts but no prolonged artificial ventilation    No tube feeds if cognitively unaware   Review of Systems Sleep is still not great---frequent awakening Nocturia is rare. No urinary problems Bowels okay. No blood No rashes or suspicious areas.  Wears seat belt always Full dentures---top ones are not fitting right, getting them realigned    Objective:   Physical Exam  Constitutional: He is oriented to person, place, and time. He appears well-developed and well-nourished. No distress.  Neck: Normal range of motion. Neck supple. No  thyromegaly present.  Cardiovascular: Normal rate, regular rhythm, normal heart sounds and intact distal pulses.  Exam reveals no gallop.   No murmur heard. Pulmonary/Chest: Effort normal and breath sounds normal. No respiratory distress. He has no wheezes. He has no rales.  Abdominal: Soft. There is no tenderness.  Musculoskeletal: He exhibits no edema and no tenderness.  Lymphadenopathy:    He has no cervical adenopathy.  Neurological: He is alert and oriented to person, place, and time.  President-- "Elyn Peers, Kirby, Bush... ?" [id number] Not good at spelling (only 10th grade) Recall 2/3  Skin: No rash noted. No erythema.  Psychiatric: He has a normal mood and affect. His behavior is normal.          Assessment & Plan:

## 2014-05-13 NOTE — Assessment & Plan Note (Signed)
Stable angina Appropriate Rx Discussed immediate eval if change in angina pattern

## 2014-05-13 NOTE — Assessment & Plan Note (Signed)
Sx's recurred off H2 blocker Will resume Change to PPI if persists

## 2014-05-18 ENCOUNTER — Encounter: Payer: Self-pay | Admitting: *Deleted

## 2014-05-24 ENCOUNTER — Other Ambulatory Visit: Payer: Self-pay | Admitting: Cardiovascular Disease

## 2014-05-25 ENCOUNTER — Other Ambulatory Visit: Payer: Self-pay | Admitting: Internal Medicine

## 2014-05-25 ENCOUNTER — Other Ambulatory Visit: Payer: Self-pay | Admitting: *Deleted

## 2014-05-25 MED ORDER — FUROSEMIDE 40 MG PO TABS: 40.0000 mg | ORAL_TABLET | Freq: Every day | ORAL | Status: DC

## 2014-07-09 ENCOUNTER — Other Ambulatory Visit: Payer: Self-pay | Admitting: Internal Medicine

## 2014-08-19 ENCOUNTER — Other Ambulatory Visit: Payer: Self-pay | Admitting: Cardiovascular Disease

## 2014-08-24 ENCOUNTER — Encounter: Payer: Self-pay | Admitting: Cardiovascular Disease

## 2014-08-24 ENCOUNTER — Ambulatory Visit (INDEPENDENT_AMBULATORY_CARE_PROVIDER_SITE_OTHER): Payer: Medicare HMO | Admitting: Cardiovascular Disease

## 2014-08-24 VITALS — BP 108/70 | HR 63 | Ht 66.0 in | Wt 194.1 lb

## 2014-08-24 DIAGNOSIS — I10 Essential (primary) hypertension: Secondary | ICD-10-CM

## 2014-08-24 DIAGNOSIS — I2510 Atherosclerotic heart disease of native coronary artery without angina pectoris: Secondary | ICD-10-CM

## 2014-08-24 DIAGNOSIS — E785 Hyperlipidemia, unspecified: Secondary | ICD-10-CM

## 2014-08-24 NOTE — Patient Instructions (Signed)
Your physician wants you to follow-up in:  6 months. You will receive a reminder letter in the mail two months in advance. If you don't receive a letter, please call our office to schedule the follow-up appointment.   

## 2014-08-24 NOTE — Progress Notes (Signed)
History of Present Illness: 74 yo WM with history of CAD, HLD, GERD who is here today for cardiac follow up. He has been followed in the past by Dr. Harrington Challenger. His last cardiac cath 04/12/10 with Dr.Brodie showed 40% proximal LAD, 50% Mid to distal LAD, diffuse narrowing RCA. He has not tolerated Imdur in the past. He had bradycardia with Bystolic so it was stopped. Stress myoview February 2013 with no large areas of ischemia, LVEF=64%. Cardiac cath 06/04/12 with moderate stable CAD. He was started on Imdur but did not tolerate secondary to headaches. He was started on Lasix secondary to elevated filling pressures on cath. Trial of Ranexa after visit here in March 2015 but it did not help.   He is here today for follow up. Still has mild chest pressure at peak exertion but exercising every day. No changes over last two years. Walks 30 minutes per day. Feeling well. No dizziness. No LE edema.   Primary Care Physician: Silvio Pate   Last Lipid Profile:Lipid Panel     Component Value Date/Time   CHOL 136 05/13/2014 0912   TRIG 97.0 05/13/2014 0912   HDL 47.40 05/13/2014 0912   CHOLHDL 3 05/13/2014 0912   VLDL 19.4 05/13/2014 0912   LDLCALC 69 05/13/2014 0912     Past Medical History  Diagnosis Date  . Chest pain   . Allergic rhinitis   . Hyperlipidemia   . Duodenal ulcer   . ED (erectile dysfunction)   . Osteoarthritis   . CAD (coronary artery disease)   . History of colonoscopy   . GERD (gastroesophageal reflux disease)     Past Surgical History  Procedure Laterality Date  . Appendectomy  1964  . Hernia repair  1970    double hernia repair   . Retinal detachment surgery  2003  . Cardiac catheterization  03/2010    diffuse early disease  . Tonsillectomy      Current Outpatient Prescriptions  Medication Sig Dispense Refill  . aspirin 81 MG tablet Take 81 mg by mouth daily.      . furosemide (LASIX) 40 MG tablet Take 1 tablet (40 mg total) by mouth daily. 90 tablet 1  .  losartan (COZAAR) 50 MG tablet TAKE 1 TABLET BY MOUTH EVERY DAY 90 tablet 1  . nitroGLYCERIN (NITROSTAT) 0.4 MG SL tablet Place 1 tablet (0.4 mg total) under the tongue every 5 (five) minutes as needed for chest pain. Take as directed 25 tablet 3  . ranitidine (ZANTAC) 150 MG tablet Take 1 tablet (150 mg total) by mouth 2 (two) times daily. 180 tablet 3  . simvastatin (ZOCOR) 40 MG tablet TAKE 1 TABLET BY MOUTH EVERY EVENING 90 tablet 3   No current facility-administered medications for this visit.    Allergies  Allergen Reactions  . Ibuprofen     REACTION: ulcer    History   Social History  . Marital Status: Married    Spouse Name: N/A    Number of Children: 2  . Years of Education: N/A   Occupational History  . Administrator, Civil Service     retired   Social History Main Topics  . Smoking status: Former Smoker -- 1.00 packs/day for 10 years    Types: Cigars    Quit date: 07/18/2007  . Smokeless tobacco: Never Used     Comment: Occ cigar - no cigarettes - stopped in 6-09  . Alcohol Use: 0.5 oz/week    1 drink(s) per week  Comment: occ beer  . Drug Use: No  . Sexual Activity: Not on file   Other Topics Concern  . Not on file   Social History Narrative   Has living will   Wife would serve as health care POA--then son   Would accept resuscitation attempts but no prolonged artificial ventilation    No tube feeds if cognitively unaware    Family History  Problem Relation Age of Onset  . Cancer Mother     brain cancer  that was metastatic  from lung   . Coronary artery disease Mother     CABG in 70's  . Cancer Father     stomach cancer- 52  . Diabetes Sister   . Stroke Maternal Grandmother     Review of Systems:  As stated in the HPI and otherwise negative.   BP 108/70 mmHg  Pulse 63  Ht 5\' 6"  (1.676 m)  Wt 194 lb 1.9 oz (88.052 kg)  BMI 31.35 kg/m2  SpO2 97%  Physical Examination: General: Well developed, well nourished, NAD HEENT: OP clear, mucus membranes  moist SKIN: warm, dry. No rashes. Neuro: No focal deficits Musculoskeletal: Muscle strength 5/5 all ext Psychiatric: Mood and affect normal Neck: No JVD, no carotid bruits, no thyromegaly, no lymphadenopathy. Lungs:Clear bilaterally, no wheezes, rhonci, crackles Cardiovascular: Loletha Grayer, regular rhythm. No murmurs, gallops or rubs. Abdomen:Soft. Bowel sounds present. Non-tender.  Extremities: No lower extremity edema. Pulses are 2 + in the bilateral DP/PT.  Assessment and Plan:   1. CAD: Stable. Moderate disease by cath November 2013. He did not tolerate Imdur secondary to headache. No beta blockers secondary to bradycardia. Continue ASA, statin, ARB. He is having stable anginal type pains at peak exercise, no changes. No improvement with Ranexa. He does not tolerate Imdur.   2. HTN: BP controlled. Continue ARB.   3. Hyperlipidemia: He is on a statin. Lipids well controlled.   4. Sinus Bradycardia: No dizziness. No beta blocker.

## 2014-12-10 ENCOUNTER — Other Ambulatory Visit: Payer: Self-pay | Admitting: Cardiovascular Disease

## 2014-12-11 NOTE — Telephone Encounter (Signed)
Per note 2.8.16

## 2014-12-17 ENCOUNTER — Encounter: Payer: Self-pay | Admitting: Cardiovascular Disease

## 2015-02-23 ENCOUNTER — Telehealth: Payer: Self-pay | Admitting: *Deleted

## 2015-02-23 NOTE — Telephone Encounter (Signed)
OK to refill. cdm 

## 2015-02-23 NOTE — Telephone Encounter (Signed)
Do not see this medication on pt medication list.

## 2015-02-23 NOTE — Telephone Encounter (Signed)
Pharmacist at [place] requests an rx be called in for [facility] for the patient. Please advise. Thanks, MI

## 2015-02-23 NOTE — Telephone Encounter (Signed)
It is in medication history, previously ordered by Dr Angelena Form.

## 2015-02-24 ENCOUNTER — Other Ambulatory Visit: Payer: Self-pay | Admitting: Cardiovascular Disease

## 2015-02-24 ENCOUNTER — Other Ambulatory Visit: Payer: Self-pay | Admitting: *Deleted

## 2015-02-24 MED ORDER — ZOLPIDEM TARTRATE 5 MG PO TABS: 5.0000 mg | ORAL_TABLET | Freq: Every evening | ORAL | Status: DC | PRN

## 2015-02-24 NOTE — Telephone Encounter (Signed)
Should the rx still be for zolpidem 5mg  prn for sleep?

## 2015-02-24 NOTE — Telephone Encounter (Signed)
5 mg will be great. Thanks. Gerald Stabs

## 2015-02-24 NOTE — Telephone Encounter (Signed)
Rx called in to pharmacy. 

## 2015-03-09 ENCOUNTER — Ambulatory Visit (INDEPENDENT_AMBULATORY_CARE_PROVIDER_SITE_OTHER): Payer: Medicare HMO | Admitting: Ophthalmology

## 2015-03-09 DIAGNOSIS — H35371 Puckering of macula, right eye: Secondary | ICD-10-CM | POA: Diagnosis not present

## 2015-03-09 DIAGNOSIS — H35033 Hypertensive retinopathy, bilateral: Secondary | ICD-10-CM

## 2015-03-09 DIAGNOSIS — H43813 Vitreous degeneration, bilateral: Secondary | ICD-10-CM | POA: Diagnosis not present

## 2015-03-09 DIAGNOSIS — H2513 Age-related nuclear cataract, bilateral: Secondary | ICD-10-CM

## 2015-03-09 DIAGNOSIS — I10 Essential (primary) hypertension: Secondary | ICD-10-CM

## 2015-03-09 DIAGNOSIS — H33103 Unspecified retinoschisis, bilateral: Secondary | ICD-10-CM | POA: Diagnosis not present

## 2015-03-30 NOTE — Progress Notes (Signed)
Chief Complaint  Patient presents with  . Shortness of Breath      History of Present Illness: 74 yo WM with history of CAD, HLD, GERD who is here today for cardiac follow up. He has been followed in the past by Dr. Harrington Challenger. His last cardiac cath 04/12/10 with Dr.Brodie showed 40% proximal LAD, 50% Mid to distal LAD, diffuse narrowing RCA. He has not tolerated Imdur in the past. He had bradycardia with Bystolic so it was stopped. Stress myoview February 2013 with no large areas of ischemia, LVEF=64%. Cardiac cath 06/04/12 with moderate stable CAD. He was started on Imdur but did not tolerate secondary to headaches. He was started on Lasix secondary to elevated filling pressures on cath. Trial of Ranexa after visit here in March 2015 but it did not help.   He is here today for follow up. Still has mild chest pressure at peak exertion but exercising every day. No changes over last three years. Walks 30 minutes per day. Feeling well. No dizziness. No LE edema.   Primary Care Physician: Silvio Pate   Last Lipid Profile:Lipid Panel     Component Value Date/Time   CHOL 136 05/13/2014 0912   TRIG 97.0 05/13/2014 0912   HDL 47.40 05/13/2014 0912   CHOLHDL 3 05/13/2014 0912   VLDL 19.4 05/13/2014 0912   LDLCALC 69 05/13/2014 0912     Past Medical History  Diagnosis Date  . Chest pain   . Allergic rhinitis   . Hyperlipidemia   . Duodenal ulcer   . ED (erectile dysfunction)   . Osteoarthritis   . CAD (coronary artery disease)   . History of colonoscopy   . GERD (gastroesophageal reflux disease)     Past Surgical History  Procedure Laterality Date  . Appendectomy  1964  . Hernia repair  1970    double hernia repair   . Retinal detachment surgery  2003  . Cardiac catheterization  03/2010    diffuse early disease  . Tonsillectomy      Current Outpatient Prescriptions  Medication Sig Dispense Refill  . aspirin 81 MG tablet Take 81 mg by mouth daily.      . furosemide (LASIX) 40 MG  tablet TAKE 1 TABLET BY MOUTH DAILY 90 tablet 1  . losartan (COZAAR) 50 MG tablet TAKE 1 TABLET BY MOUTH DAILY 90 tablet 0  . nitroGLYCERIN (NITROSTAT) 0.4 MG SL tablet Place 1 tablet (0.4 mg total) under the tongue every 5 (five) minutes as needed for chest pain. Take as directed 25 tablet 6  . ranitidine (ZANTAC) 150 MG tablet Take 1 tablet (150 mg total) by mouth 2 (two) times daily. 180 tablet 3  . simvastatin (ZOCOR) 40 MG tablet TAKE 1 TABLET BY MOUTH EVERY EVENING 90 tablet 3  . zolpidem (AMBIEN) 5 MG tablet Take 1 tablet (5 mg total) by mouth at bedtime as needed for sleep. 30 tablet 3   No current facility-administered medications for this visit.    Allergies  Allergen Reactions  . Ibuprofen     REACTION: ulcer    Social History   Social History  . Marital Status: Married    Spouse Name: N/A  . Number of Children: 2  . Years of Education: N/A   Occupational History  . Administrator, Civil Service     retired   Social History Main Topics  . Smoking status: Former Smoker -- 1.00 packs/day for 10 years    Types: Cigars    Quit date:  07/18/2007  . Smokeless tobacco: Never Used     Comment: Occ cigar - no cigarettes - stopped in 6-09  . Alcohol Use: 0.5 oz/week    1 drink(s) per week     Comment: occ beer  . Drug Use: No  . Sexual Activity: Not on file   Other Topics Concern  . Not on file   Social History Narrative   Has living will   Wife would serve as health care POA--then son   Would accept resuscitation attempts but no prolonged artificial ventilation    No tube feeds if cognitively unaware    Family History  Problem Relation Age of Onset  . Cancer Mother     brain cancer  that was metastatic  from lung   . Coronary artery disease Mother     CABG in 54's  . Cancer Father     stomach cancer- 20  . Diabetes Sister   . Stroke Maternal Grandmother     Review of Systems:  As stated in the HPI and otherwise negative.   BP 128/60 mmHg  Pulse 50  Ht 5\' 6"  (1.676 m)   Wt 187 lb (84.823 kg)  BMI 30.20 kg/m2  Physical Examination: General: Well developed, well nourished, NAD HEENT: OP clear, mucus membranes moist SKIN: warm, dry. No rashes. Neuro: No focal deficits Musculoskeletal: Muscle strength 5/5 all ext Psychiatric: Mood and affect normal Neck: No JVD, no carotid bruits, no thyromegaly, no lymphadenopathy. Lungs:Clear bilaterally, no wheezes, rhonci, crackles Cardiovascular: Loletha Grayer, regular rhythm. No murmurs, gallops or rubs. Abdomen:Soft. Bowel sounds present. Non-tender.  Extremities: No lower extremity edema. Pulses are 2 + in the bilateral DP/PT.  EKG:  EKG is ordered today. The ekg ordered today demonstrates Sinus brady, rate 50 bpm. 1st degree AV block  Recent Labs: 05/13/2014: ALT 14; BUN 23; Creatinine, Ser 1.1; Hemoglobin 15.7; Platelets 229.0; Potassium 4.6; Sodium 138   Lipid Panel    Component Value Date/Time   CHOL 136 05/13/2014 0912   TRIG 97.0 05/13/2014 0912   HDL 47.40 05/13/2014 0912   CHOLHDL 3 05/13/2014 0912   VLDL 19.4 05/13/2014 0912   LDLCALC 69 05/13/2014 0912   LDLDIRECT 131.3 04/06/2009 0922     Wt Readings from Last 3 Encounters:  03/31/15 187 lb (84.823 kg)  08/24/14 194 lb 1.9 oz (88.052 kg)  05/13/14 188 lb (85.276 kg)     Other studies Reviewed: Additional studies/ records that were reviewed today include: . Review of the above records demonstrates:    Assessment and Plan:   1. CAD: Stable. Moderate disease by cath November 2013. He did not tolerate Imdur secondary to headache. No beta blockers secondary to bradycardia. Continue ASA, statin, ARB. He is having stable anginal type pains at peak exercise, no changes. No improvement with Ranexa. He does not tolerate Imdur. Refill SL NTG.   2. HTN: BP controlled. Continue ARB.   3. Hyperlipidemia: He is on a statin. Lipids well controlled.   4. Sinus Bradycardia: No dizziness. No beta blocker.  Current medicines are reviewed at length with  the patient today.  The patient does not have concerns regarding medicines.  The following changes have been made:  no change  Labs/ tests ordered today include:   Orders Placed This Encounter  Procedures  . EKG 12-Lead    Disposition:   FU with me  in 6 months  Signed, Lauree Chandler, MD 03/31/2015 8:57 AM    [facility]  [address], [place], [place]  [address] Phone: [phone]; Fax: [phone]

## 2015-03-31 ENCOUNTER — Ambulatory Visit (INDEPENDENT_AMBULATORY_CARE_PROVIDER_SITE_OTHER): Payer: Medicare HMO | Admitting: Cardiovascular Disease

## 2015-03-31 VITALS — BP 128/60 | HR 50 | Ht 66.0 in | Wt 187.0 lb

## 2015-03-31 DIAGNOSIS — I10 Essential (primary) hypertension: Secondary | ICD-10-CM | POA: Diagnosis not present

## 2015-03-31 DIAGNOSIS — E785 Hyperlipidemia, unspecified: Secondary | ICD-10-CM | POA: Diagnosis not present

## 2015-03-31 DIAGNOSIS — I2510 Atherosclerotic heart disease of native coronary artery without angina pectoris: Secondary | ICD-10-CM

## 2015-03-31 DIAGNOSIS — R001 Bradycardia, unspecified: Secondary | ICD-10-CM

## 2015-03-31 MED ORDER — NITROGLYCERIN 0.4 MG SL SUBL: 0.4000 mg | SUBLINGUAL_TABLET | SUBLINGUAL | Status: DC | PRN

## 2015-03-31 NOTE — Patient Instructions (Signed)

## 2015-05-19 ENCOUNTER — Ambulatory Visit (INDEPENDENT_AMBULATORY_CARE_PROVIDER_SITE_OTHER): Payer: Medicare HMO | Admitting: Internal Medicine

## 2015-05-19 ENCOUNTER — Encounter: Payer: Self-pay | Admitting: Internal Medicine

## 2015-05-19 VITALS — BP 122/60 | HR 62 | Temp 97.6°F | Ht 66.0 in | Wt 188.0 lb

## 2015-05-19 DIAGNOSIS — I25119 Atherosclerotic heart disease of native coronary artery with unspecified angina pectoris: Secondary | ICD-10-CM | POA: Diagnosis not present

## 2015-05-19 DIAGNOSIS — E785 Hyperlipidemia, unspecified: Secondary | ICD-10-CM | POA: Diagnosis not present

## 2015-05-19 DIAGNOSIS — Z0000 Encounter for general adult medical examination without abnormal findings: Secondary | ICD-10-CM | POA: Diagnosis not present

## 2015-05-19 DIAGNOSIS — Z23 Encounter for immunization: Secondary | ICD-10-CM | POA: Diagnosis not present

## 2015-05-19 DIAGNOSIS — K219 Gastro-esophageal reflux disease without esophagitis: Secondary | ICD-10-CM | POA: Diagnosis not present

## 2015-05-19 DIAGNOSIS — Z7189 Other specified counseling: Secondary | ICD-10-CM

## 2015-05-19 LAB — CBC WITH DIFFERENTIAL/PLATELET
BASOS ABS: 0 10*3/uL (ref 0.0–0.1)
Basophils Relative: 0.7 % (ref 0.0–3.0)
EOS ABS: 0.2 10*3/uL (ref 0.0–0.7)
Eosinophils Relative: 2.9 % (ref 0.0–5.0)
HEMATOCRIT: 47.7 % (ref 39.0–52.0)
HEMOGLOBIN: 15.8 g/dL (ref 13.0–17.0)
LYMPHS PCT: 28.3 % (ref 12.0–46.0)
Lymphs Abs: 2.1 10*3/uL (ref 0.7–4.0)
MCHC: 33.1 g/dL (ref 30.0–36.0)
MCV: 89.7 fl (ref 78.0–100.0)
MONOS PCT: 10.8 % (ref 3.0–12.0)
Monocytes Absolute: 0.8 10*3/uL (ref 0.1–1.0)
Neutro Abs: 4.3 10*3/uL (ref 1.4–7.7)
Neutrophils Relative %: 57.3 % (ref 43.0–77.0)
Platelets: 241 10*3/uL (ref 150.0–400.0)
RBC: 5.31 Mil/uL (ref 4.22–5.81)
RDW: 13.4 % (ref 11.5–15.5)
WBC: 7.5 10*3/uL (ref 4.0–10.5)

## 2015-05-19 LAB — COMPREHENSIVE METABOLIC PANEL
ALBUMIN: 4.2 g/dL (ref 3.5–5.2)
ALK PHOS: 79 U/L (ref 39–117)
ALT: 17 U/L (ref 0–53)
AST: 21 U/L (ref 0–37)
BILIRUBIN TOTAL: 0.8 mg/dL (ref 0.2–1.2)
BUN: 25 mg/dL — ABNORMAL HIGH (ref 6–23)
CALCIUM: 9.5 mg/dL (ref 8.4–10.5)
CO2: 30 mEq/L (ref 19–32)
CREATININE: 1.11 mg/dL (ref 0.40–1.50)
Chloride: 99 mEq/L (ref 96–112)
GFR: 68.5 mL/min (ref >60.00)
Glucose, Bld: 90 mg/dL (ref 70–99)
Potassium: 4.3 mEq/L (ref 3.5–5.1)
Sodium: 136 mEq/L (ref 135–145)
TOTAL PROTEIN: 7 g/dL (ref 6.0–8.3)

## 2015-05-19 LAB — LIPID PANEL
CHOL/HDL RATIO: 3
Cholesterol: 145 mg/dL (ref 0–200)
HDL: 50.1 mg/dL (ref >39.00)
LDL Cholesterol: 76 mg/dL (ref 0–99)
NonHDL: 94.75
TRIGLYCERIDES: 96 mg/dL (ref 0.0–149.0)
VLDL: 19.2 mg/dL (ref 0.0–40.0)

## 2015-05-19 NOTE — Progress Notes (Signed)
Subjective:    Patient ID: Jonathan Griffith, male    DOB: [birth year], 74 y.o.   MRN: [id number]  HPI Here for Medicare wellness and follow up of chronic medical conditions Reviewed form and advanced directives Reviewed other physicians Vision is --early cataract Mild hearing loss No tobacco 1-2 drinks per day---bourbon and coke Regular exercise No falls No depression or anhedonia No cognitive problems Independent with instrumental ADLs  Doing well No new heart symptoms Still gets mild DOE walking up hill with dog--but [facility] On furosemide for fluid Rare brief palpitations No syncope--will have occasional brief dizziness when getting out of chair Hasn't needed the nitro   Stomach has been fine Will still get occasional water brash at night Keeps HOB up and avoids eating before bedtime  No myalgias or GI problems with the statin  Current Outpatient Prescriptions on File Prior to Visit  Medication Sig Dispense Refill  . aspirin 81 MG tablet Take 81 mg by mouth daily.      . furosemide (LASIX) 40 MG tablet TAKE 1 TABLET BY MOUTH DAILY 90 tablet 1  . losartan (COZAAR) 50 MG tablet TAKE 1 TABLET BY MOUTH DAILY 90 tablet 0  . nitroGLYCERIN (NITROSTAT) 0.4 MG SL tablet Place 1 tablet (0.4 mg total) under the tongue every 5 (five) minutes as needed for chest pain. Take as directed 25 tablet 6  . ranitidine (ZANTAC) 150 MG tablet Take 1 tablet (150 mg total) by mouth 2 (two) times daily. 180 tablet 3  . simvastatin (ZOCOR) 40 MG tablet TAKE 1 TABLET BY MOUTH EVERY EVENING 90 tablet 3   No current facility-administered medications on file prior to visit.    Allergies  Allergen Reactions  . Ibuprofen     REACTION: ulcer    Past Medical History  Diagnosis Date  . Chest pain   . Allergic rhinitis   . Hyperlipidemia   . Duodenal ulcer   . ED (erectile dysfunction)   . Osteoarthritis   . CAD (coronary artery disease)   . History of colonoscopy   . GERD  (gastroesophageal reflux disease)     Past Surgical History  Procedure Laterality Date  . Appendectomy  1964  . Hernia repair  1970    double hernia repair   . Retinal detachment surgery  2003  . Cardiac catheterization  03/2010    diffuse early disease  . Tonsillectomy      Family History  Problem Relation Age of Onset  . Cancer Mother     brain cancer  that was metastatic  from lung   . Coronary artery disease Mother     CABG in 79's  . Cancer Father     stomach cancer- 88  . Diabetes Sister   . Stroke Maternal Grandmother     Social History   Social History  . Marital Status: Married    Spouse Name: N/A  . Number of Children: 2  . Years of Education: N/A   Occupational History  . Administrator, Civil Service     retired   Social History Main Topics  . Smoking status: Former Smoker -- 1.00 packs/day for 10 years    Types: Cigars    Quit date: 07/18/2007  . Smokeless tobacco: Never Used     Comment: Occ cigar - no cigarettes - stopped in 6-09  . Alcohol Use: 0.5 oz/week    1 drink(s) per week     Comment: occ beer  . Drug Use:  No  . Sexual Activity: Not on file   Other Topics Concern  . Not on file   Social History Narrative   Has living will   Wife would serve as health care POA--then son   Would accept resuscitation attempts but no prolonged artificial ventilation    No tube feeds if cognitively unaware   Review of Systems Still not great with sleep--- sleeps 3-4 hours, then in and out of sleep. 10 minute nap in afternoon. Generally good energy levels. Couldn't tolerate zolpidem Appetite is fine Weight is stable Wears seat belt Full dentures No skin problems--just easy bruising Bowels are okay---occasional stinging or burning at rectum Mild back and leg pain--trying to get up at first    Objective:   Physical Exam  Constitutional: He is oriented to person, place, and time. He appears well-developed and well-nourished. No distress.  Neck: Normal range of  motion. Neck supple. No thyromegaly present.  Cardiovascular: Normal rate, regular rhythm, normal heart sounds and intact distal pulses.  Exam reveals no gallop.   No murmur heard. Pulmonary/Chest: Effort normal and breath sounds normal. No respiratory distress. He has no wheezes. He has no rales.  Abdominal: Soft. There is no tenderness.  Musculoskeletal: He exhibits no edema or tenderness.  Lymphadenopathy:    He has no cervical adenopathy.  Neurological: He is alert and oriented to person, place, and time.  President-- "Obama, Bush, CLinton" [phone] "I am not good at spelling" Recall 2/3  Skin: No rash noted. No erythema.  Psychiatric: He has a normal mood and affect. His behavior is normal.          Assessment & Plan:

## 2015-05-19 NOTE — Progress Notes (Signed)
Pre visit review using our clinic review tool, if applicable. No additional management support is needed unless otherwise documented below in the visit note. 

## 2015-05-19 NOTE — Assessment & Plan Note (Signed)
I have personally reviewed the Medicare Annual Wellness questionnaire and have noted 1. The patient's medical and social history 2. Their use of alcohol, tobacco or illicit drugs 3. Their current medications and supplements 4. The patient's functional ability including ADL's, fall risks, home safety risks and hearing or visual             impairment. 5. Diet and physical activities 6. Evidence for depression or mood disorders  The patients weight, height, BMI and visual acuity have been recorded in the chart I have made referrals, counseling and provided education to the patient based review of the above and I have provided the pt with a written personalized care plan for preventive services.  I have provided you with a copy of your personalized plan for preventive services. Please take the time to review along with your updated medication list.  Flu vaccine today No more prostate cancer screening Reconsider colon cancer screening next year--- not sure we want to do anything

## 2015-05-19 NOTE — Assessment & Plan Note (Signed)
Good control with ranitidine

## 2015-05-19 NOTE — Assessment & Plan Note (Signed)
Stable DOE walking up a hill Still very active in gym though No need for further evaluation at this point

## 2015-05-19 NOTE — Addendum Note (Signed)
Addended by: Marchia Bond on: 05/19/2015 01:53 PM   Modules accepted: Miquel Dunn

## 2015-05-19 NOTE — Assessment & Plan Note (Signed)
No problems with statin Due for labs 

## 2015-05-19 NOTE — Assessment & Plan Note (Signed)
See social history Blank forms done 

## 2015-05-19 NOTE — Addendum Note (Signed)
Addended by: Despina Hidden on: 05/19/2015 10:45 AM   Modules accepted: Orders

## 2015-05-31 ENCOUNTER — Other Ambulatory Visit: Payer: Self-pay | Admitting: Cardiovascular Disease

## 2015-06-07 ENCOUNTER — Other Ambulatory Visit: Payer: Self-pay | Admitting: Internal Medicine

## 2015-07-12 ENCOUNTER — Other Ambulatory Visit: Payer: Self-pay | Admitting: Internal Medicine

## 2015-10-22 ENCOUNTER — Telehealth: Payer: Self-pay

## 2015-10-22 NOTE — Telephone Encounter (Signed)
Pt left v/m; pt received call to schedule a colonoscopy; pt wants to know if Dr Silvio Pate recommends pt to have colonoscopy. At annual exam on 05/19/2015 noted reconsider colon CA screening next year; not sure we want to do anything. Pt request cb.

## 2015-10-23 NOTE — Telephone Encounter (Signed)
Message left Will try back later or Monday

## 2015-10-25 NOTE — Telephone Encounter (Signed)
Call him today He does not want colonoscopy I told him that is okay at his age Will discuss FIT test at yearly visit--this is also optional though

## 2015-11-01 ENCOUNTER — Ambulatory Visit (INDEPENDENT_AMBULATORY_CARE_PROVIDER_SITE_OTHER): Payer: PPO | Admitting: Cardiovascular Disease

## 2015-11-01 ENCOUNTER — Encounter: Payer: Self-pay | Admitting: Cardiovascular Disease

## 2015-11-01 VITALS — BP 115/68 | HR 54 | Ht 66.0 in | Wt 195.2 lb

## 2015-11-01 DIAGNOSIS — E785 Hyperlipidemia, unspecified: Secondary | ICD-10-CM | POA: Diagnosis not present

## 2015-11-01 DIAGNOSIS — I10 Essential (primary) hypertension: Secondary | ICD-10-CM | POA: Diagnosis not present

## 2015-11-01 DIAGNOSIS — I25119 Atherosclerotic heart disease of native coronary artery with unspecified angina pectoris: Secondary | ICD-10-CM

## 2015-11-01 NOTE — Progress Notes (Signed)
Chief Complaint  Patient presents with  . Coronary Artery Disease   History of Present Illness: 75 yo WM with history of CAD, HLD, GERD who is here today for cardiac follow up. He has been followed in the past by Dr. Harrington Challenger. His last cardiac cath 04/12/10 with Dr.Brodie showed 40% proximal LAD, 50% Mid to distal LAD, diffuse narrowing RCA. He has not tolerated Imdur in the past. He had bradycardia with Bystolic so it was stopped. Stress myoview February 2013 with no large areas of ischemia, LVEF=64%. Cardiac cath 06/04/12 with moderate stable CAD. He was started on Imdur but did not tolerate secondary to headaches. He was started on Lasix secondary to elevated filling pressures on cath. Trial of Ranexa after visit here in March 2015 but it did not help so he stopped it.   He is here today for follow up. Still has mild chest pressure at peak exertion but exercising every day. No changes over last three years. Walks 30 minutes per day, five days per week at the [facility]. Feeling well. No dizziness. No LE edema.   Primary Care Physician: Viviana Simpler, MD  Past Medical History  Diagnosis Date  . Chest pain   . Allergic rhinitis   . Hyperlipidemia   . Duodenal ulcer   . ED (erectile dysfunction)   . Osteoarthritis   . CAD (coronary artery disease)   . History of colonoscopy   . GERD (gastroesophageal reflux disease)     Past Surgical History  Procedure Laterality Date  . Appendectomy  1964  . Hernia repair  1970    double hernia repair   . Retinal detachment surgery  2003  . Cardiac catheterization  03/2010    diffuse early disease  . Tonsillectomy      Current Outpatient Prescriptions  Medication Sig Dispense Refill  . aspirin 81 MG tablet Take 81 mg by mouth daily.      . furosemide (LASIX) 40 MG tablet TAKE 1 TABLET BY MOUTH DAILY 90 tablet 1  . losartan (COZAAR) 50 MG tablet TAKE 1 TABLET BY MOUTH DAILY 90 tablet 2  . nitroGLYCERIN (NITROSTAT) 0.4 MG SL tablet Place 0.4 mg  under the tongue every 5 (five) minutes as needed for chest pain (x 3 doses).    . ranitidine (ZANTAC) 150 MG tablet TAKE 1 TABLET BY MOUTH TWICE A DAY 180 tablet 0  . simvastatin (ZOCOR) 40 MG tablet TAKE ONE TABLET BY MOUTH EACH EVENING 90 tablet 3   No current facility-administered medications for this visit.    Allergies  Allergen Reactions  . Ibuprofen     REACTION: ulcer    Social History   Social History  . Marital Status: Married    Spouse Name: N/A  . Number of Children: 2  . Years of Education: N/A   Occupational History  . Administrator, Civil Service     retired   Social History Main Topics  . Smoking status: Former Smoker -- 1.00 packs/day for 10 years    Types: Cigars    Quit date: 07/18/2007  . Smokeless tobacco: Never Used     Comment: Occ cigar - no cigarettes - stopped in 6-09  . Alcohol Use: 0.5 oz/week    1 drink(s) per week     Comment: occ beer  . Drug Use: No  . Sexual Activity: Not on file   Other Topics Concern  . Not on file   Social History Narrative   Has living will  Wife would serve as health care POA--then son   Would accept resuscitation attempts but no prolonged artificial ventilation    No tube feeds if cognitively unaware    Family History  Problem Relation Age of Onset  . Cancer Mother     brain cancer  that was metastatic  from lung   . Coronary artery disease Mother     CABG in 37's  . Cancer Father     stomach cancer- 42  . Diabetes Sister   . Stroke Maternal Grandmother     Review of Systems:  As stated in the HPI and otherwise negative.   BP 115/68 mmHg  Pulse 54  Ht 5\' 6"  (1.676 m)  Wt 195 lb 3.2 oz (88.542 kg)  BMI 31.52 kg/m2  SpO2 99%  Physical Examination: General: Well developed, well nourished, NAD HEENT: OP clear, mucus membranes moist SKIN: warm, dry. No rashes. Neuro: No focal deficits Musculoskeletal: Muscle strength 5/5 all ext Psychiatric: Mood and affect normal Neck: No JVD, no carotid bruits, no  thyromegaly, no lymphadenopathy. Lungs:Clear bilaterally, no wheezes, rhonci, crackles Cardiovascular: Loletha Grayer, regular rhythm. No murmurs, gallops or rubs. Abdomen:Soft. Bowel sounds present. Non-tender.  Extremities: No lower extremity edema. Pulses are 2 + in the bilateral DP/PT.  EKG:  EKG is not ordered today. The ekg ordered today demonstrates   Recent Labs: 05/19/2015: ALT 17; BUN 25*; Creatinine, Ser 1.11; Hemoglobin 15.8; Platelets 241.0; Potassium 4.3; Sodium 136   Lipid Panel    Component Value Date/Time   CHOL 145 05/19/2015 0904   TRIG 96.0 05/19/2015 0904   HDL 50.10 05/19/2015 0904   CHOLHDL 3 05/19/2015 0904   VLDL 19.2 05/19/2015 0904   LDLCALC 76 05/19/2015 0904   LDLDIRECT 131.3 04/06/2009 0922     Wt Readings from Last 3 Encounters:  11/01/15 195 lb 3.2 oz (88.542 kg)  05/19/15 188 lb (85.276 kg)  03/31/15 187 lb (84.823 kg)     Other studies Reviewed: Additional studies/ records that were reviewed today include: . Review of the above records demonstrates:    Assessment and Plan:   1. CAD: Stable. Moderate disease by cath November 2013. He did not tolerate Imdur secondary to headache. No beta blockers secondary to bradycardia. Continue ASA, statin, ARB. He is having stable anginal type pains at peak exercise, no changes. No improvement with Ranexa. He does not tolerate Imdur. Refill SL NTG.   2. HTN: BP controlled. Continue ARB.   3. Hyperlipidemia: He is on a statin. Lipids well controlled.   4. Sinus Bradycardia: No dizziness. No beta blocker.  Current medicines are reviewed at length with the patient today.  The patient does not have concerns regarding medicines.  The following changes have been made:  no change  Labs/ tests ordered today include:   No orders of the defined types were placed in this encounter.    Disposition:   FU with me  in 6 months  Signed, Lauree Chandler, MD 11/01/2015 11:04 AM    [facility] [address], [place], [place]  [address] Phone: [phone]; Fax: [phone]

## 2015-11-01 NOTE — Patient Instructions (Signed)

## 2015-11-17 ENCOUNTER — Other Ambulatory Visit: Payer: Self-pay | Admitting: Internal Medicine

## 2015-12-28 ENCOUNTER — Encounter: Payer: Self-pay | Admitting: Gastroenterology

## 2016-01-12 ENCOUNTER — Other Ambulatory Visit: Payer: Self-pay | Admitting: Cardiology

## 2016-02-16 ENCOUNTER — Encounter: Payer: Self-pay | Admitting: Gastroenterology

## 2016-02-21 ENCOUNTER — Other Ambulatory Visit: Payer: Self-pay | Admitting: Cardiovascular Disease

## 2016-03-08 ENCOUNTER — Ambulatory Visit (INDEPENDENT_AMBULATORY_CARE_PROVIDER_SITE_OTHER): Payer: PPO | Admitting: Ophthalmology

## 2016-03-08 DIAGNOSIS — H35371 Puckering of macula, right eye: Secondary | ICD-10-CM

## 2016-03-08 DIAGNOSIS — H2513 Age-related nuclear cataract, bilateral: Secondary | ICD-10-CM | POA: Diagnosis not present

## 2016-03-08 DIAGNOSIS — H43813 Vitreous degeneration, bilateral: Secondary | ICD-10-CM | POA: Diagnosis not present

## 2016-03-08 DIAGNOSIS — H33103 Unspecified retinoschisis, bilateral: Secondary | ICD-10-CM

## 2016-03-08 DIAGNOSIS — H35033 Hypertensive retinopathy, bilateral: Secondary | ICD-10-CM

## 2016-03-08 DIAGNOSIS — I10 Essential (primary) hypertension: Secondary | ICD-10-CM | POA: Diagnosis not present

## 2016-03-08 DIAGNOSIS — H338 Other retinal detachments: Secondary | ICD-10-CM | POA: Diagnosis not present

## 2016-03-24 ENCOUNTER — Ambulatory Visit (INDEPENDENT_AMBULATORY_CARE_PROVIDER_SITE_OTHER): Payer: PPO | Admitting: Internal Medicine

## 2016-03-24 ENCOUNTER — Encounter: Payer: Self-pay | Admitting: Internal Medicine

## 2016-03-24 VITALS — BP 110/70 | HR 50 | Temp 97.4°F | Wt 191.0 lb

## 2016-03-24 DIAGNOSIS — M79675 Pain in left toe(s): Secondary | ICD-10-CM | POA: Diagnosis not present

## 2016-03-24 DIAGNOSIS — G479 Sleep disorder, unspecified: Secondary | ICD-10-CM

## 2016-03-24 LAB — URIC ACID: Uric Acid, Serum: 6.5 mg/dL (ref 4.0–7.8)

## 2016-03-24 NOTE — Assessment & Plan Note (Signed)
Suggests mild gout attack Will give uric acid info Check level Okay to use occasional NSAID--consider colchicine

## 2016-03-24 NOTE — Patient Instructions (Signed)
Please try melatonin (over the counter) for sleep. Take 3mg  and you can even double that--to 6 mg-- at bedtime. Call me if you still aren't sleeping.  Low-Purine Diet Purines are compounds that affect the level of uric acid in your body. A low-purine diet is a diet that is low in purines. Eating a low-purine diet can prevent the level of uric acid in your body from getting too high and causing gout or kidney stones or both. WHAT DO I NEED TO KNOW ABOUT THIS DIET?  Choose low-purine foods. Examples of low-purine foods are listed in the next section.  Drink plenty of fluids, especially water. Fluids can help remove uric acid from your body. Try to drink 8-16 cups (1.9-3.8 L) a day.  Limit foods high in fat, especially saturated fat, as fat makes it harder for the body to get rid of uric acid. Foods high in saturated fat include pizza, cheese, ice cream, whole milk, fried foods, and gravies. Choose foods that are lower in fat and lean sources of protein. Use olive oil when cooking as it contains healthy fats that are not high in saturated fat.  Limit alcohol. Alcohol interferes with the elimination of uric acid from your body. If you are having a gout attack, avoid all alcohol.  Keep in mind that different people's bodies react differently to different foods. You will probably learn over time which foods do or do not affect you. If you discover that a food tends to cause your gout to flare up, avoid eating that food. You can more freely enjoy foods that do not cause problems. If you have any questions about a food item, talk to your dietitian or health care provider. WHICH FOODS ARE LOW, MODERATE, AND HIGH IN PURINES? The following is a list of foods that are low, moderate, and high in purines. You can eat any amount of the foods that are low in purines. You may be able to have small amounts of foods that are moderate in purines. Ask your health care provider how much of a food moderate in purines you  can have. Avoid foods high in purines. Grains  Foods low in purines: Enriched white bread, pasta, rice, cake, cornbread, popcorn.  Foods moderate in purines: Whole-grain breads and cereals, wheat germ, bran, oatmeal. Uncooked oatmeal. Dry wheat bran or wheat germ.  Foods high in purines: Pancakes, [place] toast, biscuits, muffins. Vegetables  Foods low in purines: All vegetables, except those that are moderate in purines.  Foods moderate in purines: Asparagus, cauliflower, spinach, mushrooms, green peas. Fruits  All fruits are low in purines. Meats and other Protein Foods  Foods low in purines: Eggs, nuts, peanut butter.  Foods moderate in purines: 80-90% lean beef, lamb, veal, pork, poultry, fish, eggs, peanut butter, nuts. Crab, lobster, oysters, and shrimp. Cooked dried beans, peas, and lentils.  Foods high in purines: Anchovies, sardines, herring, mussels, tuna, codfish, scallops, trout, and haddock. Jonathan Griffith. Organ meats (such as liver or kidney). Tripe. Game meat. Goose. Sweetbreads. Dairy  All dairy foods are low in purines. Low-fat and fat-free dairy products are best because they are low in saturated fat. Beverages  Drinks low in purines: Water, carbonated beverages, tea, coffee, cocoa.  Drinks moderate in purines: Soft drinks and other drinks sweetened with high-fructose corn syrup. Juices. To find whether a food or drink is sweetened with high-fructose corn syrup, look at the ingredients list.  Drinks high in purines: Alcoholic beverages (such as beer). Condiments  Foods low in  purines: Salt, herbs, olives, pickles, relishes, vinegar.  Foods moderate in purines: Butter, margarine, oils, mayonnaise. Fats and Oils  Foods low in purines: All types, except gravies and sauces made with meat.  Foods high in purines: Gravies and sauces made with meat. Other Foods  Foods low in purines: Sugars, sweets, gelatin. Cake. Soups made without meat.  Foods moderate in purines:  Meat-based or fish-based soups, broths, or bouillons. Foods and drinks sweetened with high-fructose corn syrup.  Foods high in purines: High-fat desserts (such as ice cream, cookies, cakes, pies, doughnuts, and chocolate). Contact your dietitian for more information on foods that are not listed here.   This information is not intended to replace advice given to you by your health care provider. Make sure you discuss any questions you have with your health care provider.   Document Released: 10/28/2010 Document Revised: 07/08/2013 Document Reviewed: 06/09/2013 Elsevier Interactive Patient Education [employer].

## 2016-03-24 NOTE — Assessment & Plan Note (Signed)
Didn't do well with zolpidem Asked him to try melatonin Consider trazodone

## 2016-03-24 NOTE — Progress Notes (Signed)
Pre visit review using our clinic review tool, if applicable. No additional management support is needed unless otherwise documented below in the visit note. 

## 2016-03-24 NOTE — Progress Notes (Signed)
Subjective:    Patient ID: Jonathan Griffith, male    DOB: 1940-07-23, 75 y.o.   MRN: [id number]  HPI Here due to foot pain Noticed left big toe pain and swelling 2 days ago Noticed some redness Hard to walk on it  Took 2 aleve that night--awoke yesterday and it was some better  No known trauma  Current Outpatient Prescriptions on File Prior to Visit  Medication Sig Dispense Refill  . aspirin 81 MG tablet Take 81 mg by mouth daily.      . furosemide (LASIX) 40 MG tablet TAKE 1 TABLET BY MOUTH DAILY 90 tablet 2  . losartan (COZAAR) 50 MG tablet TAKE 1 TABLET BY MOUTH DAILY 90 tablet 2  . nitroGLYCERIN (NITROSTAT) 0.4 MG SL tablet Place 0.4 mg under the tongue every 5 (five) minutes as needed for chest pain (x 3 doses).    . ranitidine (ZANTAC) 150 MG tablet TAKE 1 TABLET BY MOUTH TWICE A DAY 180 tablet 1  . simvastatin (ZOCOR) 40 MG tablet TAKE ONE TABLET BY MOUTH EACH EVENING 90 tablet 3   No current facility-administered medications on file prior to visit.     Allergies  Allergen Reactions  . Ibuprofen     REACTION: ulcer    Past Medical History:  Diagnosis Date  . Allergic rhinitis   . CAD (coronary artery disease)   . Chest pain   . Duodenal ulcer   . ED (erectile dysfunction)   . GERD (gastroesophageal reflux disease)   . History of colonoscopy   . Hyperlipidemia   . Osteoarthritis     Past Surgical History:  Procedure Laterality Date  . APPENDECTOMY  1964  . CARDIAC CATHETERIZATION  03/2010   diffuse early disease  . HERNIA REPAIR  1970   double hernia repair   . RETINAL DETACHMENT SURGERY  2003  . TONSILLECTOMY      Family History  Problem Relation Age of Onset  . Cancer Mother     brain cancer  that was metastatic  from lung   . Coronary artery disease Mother     CABG in 13's  . Cancer Father     stomach cancer- 61  . Diabetes Sister   . Stroke Maternal Grandmother     Social History   Social History  . Marital status: Married    Spouse  name: N/A  . Number of children: 2  . Years of education: N/A   Occupational History  . Administrator, Civil Service     retired   Social History Main Topics  . Smoking status: Former Smoker    Packs/day: 1.00    Years: 10.00    Types: Cigars    Quit date: 07/18/2007  . Smokeless tobacco: Never Used     Comment: Occ cigar - no cigarettes - stopped in 6-09  . Alcohol use 0.5 oz/week    1 drink(s) per week     Comment: occ beer  . Drug use: No  . Sexual activity: Not on file   Other Topics Concern  . Not on file   Social History Narrative   Has living will   Wife would serve as health care POA--then son   Would accept resuscitation attempts but no prolonged artificial ventilation    No tube feeds if cognitively unaware   Review of Systems No fever Doesn't feel sick No other swollen joints Has had ongoing sleep problems    Objective:   Physical Exam  Constitutional: He appears  well-developed and well-nourished. No distress.  Musculoskeletal:  Very mild tenderness at left 1st MTP No other joint swelling No redness          Assessment & Plan:

## 2016-04-05 DIAGNOSIS — H2512 Age-related nuclear cataract, left eye: Secondary | ICD-10-CM | POA: Diagnosis not present

## 2016-04-05 DIAGNOSIS — H35372 Puckering of macula, left eye: Secondary | ICD-10-CM | POA: Diagnosis not present

## 2016-04-05 DIAGNOSIS — H25012 Cortical age-related cataract, left eye: Secondary | ICD-10-CM | POA: Diagnosis not present

## 2016-04-05 DIAGNOSIS — H25011 Cortical age-related cataract, right eye: Secondary | ICD-10-CM | POA: Diagnosis not present

## 2016-04-05 DIAGNOSIS — H25041 Posterior subcapsular polar age-related cataract, right eye: Secondary | ICD-10-CM | POA: Diagnosis not present

## 2016-04-05 DIAGNOSIS — H2511 Age-related nuclear cataract, right eye: Secondary | ICD-10-CM | POA: Diagnosis not present

## 2016-04-05 DIAGNOSIS — H35032 Hypertensive retinopathy, left eye: Secondary | ICD-10-CM | POA: Diagnosis not present

## 2016-04-05 DIAGNOSIS — H35371 Puckering of macula, right eye: Secondary | ICD-10-CM | POA: Diagnosis not present

## 2016-04-05 DIAGNOSIS — H35031 Hypertensive retinopathy, right eye: Secondary | ICD-10-CM | POA: Diagnosis not present

## 2016-04-18 DIAGNOSIS — H2511 Age-related nuclear cataract, right eye: Secondary | ICD-10-CM | POA: Diagnosis not present

## 2016-04-18 DIAGNOSIS — H25811 Combined forms of age-related cataract, right eye: Secondary | ICD-10-CM | POA: Diagnosis not present

## 2016-04-18 DIAGNOSIS — H25011 Cortical age-related cataract, right eye: Secondary | ICD-10-CM | POA: Diagnosis not present

## 2016-04-18 HISTORY — PX: CATARACT EXTRACTION: SUR2

## 2016-04-26 ENCOUNTER — Encounter: Payer: Self-pay | Admitting: Gastroenterology

## 2016-04-26 ENCOUNTER — Ambulatory Visit (INDEPENDENT_AMBULATORY_CARE_PROVIDER_SITE_OTHER): Payer: PPO | Admitting: Gastroenterology

## 2016-04-26 ENCOUNTER — Encounter (INDEPENDENT_AMBULATORY_CARE_PROVIDER_SITE_OTHER): Payer: Self-pay

## 2016-04-26 VITALS — BP 94/50 | HR 64 | Ht 65.5 in | Wt 191.0 lb

## 2016-04-26 DIAGNOSIS — Z1211 Encounter for screening for malignant neoplasm of colon: Secondary | ICD-10-CM

## 2016-04-26 NOTE — Patient Instructions (Addendum)
FOBT testing for colon cancer screening, one set at home. If positive, then you will need colonoscopy.

## 2016-04-26 NOTE — Progress Notes (Signed)
HPI: This is a    very pleasant 75 year old man  who is here to discuss colon cancer screening  Chief complaint is routine risk for colon cancer  Routine screening colonoscopy 01/2006 was normal.  He was recommended to have colonoscopy again at 10 year interval. I sent him a letter about that recently and since he was 59 I recommended he come here to the office first to discuss his options. He had also recently had a conversation with his primary care physician about how he did not want another colonoscopy.  No colon cancer in his family.  No GI symptoms.  Overall stable weight.    Review of systems: Pertinent positive and negative review of systems were noted in the above HPI section. Complete review of systems was performed and was otherwise normal.   Past Medical History:  Diagnosis Date  . Allergic rhinitis   . CAD (coronary artery disease)   . Chest pain   . Duodenal ulcer   . ED (erectile dysfunction)   . GERD (gastroesophageal reflux disease)   . History of colonoscopy   . Hyperlipidemia   . Osteoarthritis     Past Surgical History:  Procedure Laterality Date  . APPENDECTOMY  1964  . CARDIAC CATHETERIZATION  03/2010   diffuse early disease  . CATARACT EXTRACTION Right 04/18/2016  . INGUINAL HERNIA REPAIR Bilateral 1970   double hernia repair   . RETINAL DETACHMENT SURGERY Right 2003  . TONSILLECTOMY      Current Outpatient Prescriptions  Medication Sig Dispense Refill  . aspirin 81 MG tablet Take 81 mg by mouth daily.      . furosemide (LASIX) 40 MG tablet TAKE 1 TABLET BY MOUTH DAILY 90 tablet 2  . losartan (COZAAR) 50 MG tablet TAKE 1 TABLET BY MOUTH DAILY 90 tablet 2  . ranitidine (ZANTAC) 150 MG tablet TAKE 1 TABLET BY MOUTH TWICE A DAY 180 tablet 1  . simvastatin (ZOCOR) 40 MG tablet TAKE ONE TABLET BY MOUTH EACH EVENING 90 tablet 3  . nitroGLYCERIN (NITROSTAT) 0.4 MG SL tablet Place 0.4 mg under the tongue every 5 (five) minutes as needed for chest pain  (x 3 doses).     No current facility-administered medications for this visit.     Allergies as of 04/26/2016 - Review Complete 04/26/2016  Allergen Reaction Noted  . Ibuprofen  02/04/2007    Family History  Problem Relation Age of Onset  . Coronary artery disease Mother     CABG in 16's  . Brain cancer Mother     that was metastaticfrom lung   . Stomach cancer Father 67  . Diabetes Sister   . Stroke Maternal Grandmother     Social History   Social History  . Marital status: Married    Spouse name: N/A  . Number of children: 2  . Years of education: N/A   Occupational History  . Administrator, Civil Service     retired   Social History Main Topics  . Smoking status: Former Smoker    Packs/day: 1.00    Years: 10.00    Types: Cigars    Quit date: 07/18/2007  . Smokeless tobacco: Never Used     Comment: Occ cigar - no cigarettes - stopped in 6-09  . Alcohol use 0.5 oz/week    1 Standard drinks or equivalent per week  . Drug use: No  . Sexual activity: Not on file   Other Topics Concern  . Not on file   Social  History Narrative   Has living will   Wife would serve as health care POA--then son   Would accept resuscitation attempts but no prolonged artificial ventilation    No tube feeds if cognitively unaware     Physical Exam: BP (!) 94/50 (BP Location: Left Arm, Patient Position: Sitting, Cuff Size: Normal)   Pulse 64   Ht 5' 5.5" (1.664 m) Comment: height measured without shoes  Wt 191 lb (86.6 kg)   BMI 31.30 kg/m  Constitutional: generally well-appearing Psychiatric: alert and oriented x3 Eyes: extraocular movements intact Mouth: oral pharynx moist, no lesions Neck: supple no lymphadenopathy Cardiovascular: heart regular rate and rhythm Lungs: clear to auscultation bilaterally Abdomen: soft, nontender, nondistended, no obvious ascites, no peritoneal signs, normal bowel sounds Extremities: no lower extremity edema bilaterally Skin: no lesions on visible  extremities   Assessment and plan: 75 y.o. male with  Routine risk for colon cancer  Admitting nice discussion about colon cancer screening. He understands that tests for colon cancer screening generally stops between ages 15 and 68. He is 73 and in generally very good health and so I think colon cancer screening is still a clinically reasonable question for him. We discussed several different options including colonoscopy, Colo guard screening test, fecal occult blood testing and he opted for the standard fecal occult blood testing. We are going to give him a set of home stool fecal occult test and if any of this is positive he understands that I would recommend a colonoscopy and he would agree to it at that point. I see no reason for any further blood tests or imaging studies prior to then.   Owens Loffler, MD [facility] 04/26/2016, 10:20 AM  Cc: Venia Carbon, MD

## 2016-05-05 ENCOUNTER — Other Ambulatory Visit (INDEPENDENT_AMBULATORY_CARE_PROVIDER_SITE_OTHER): Payer: PPO

## 2016-05-05 DIAGNOSIS — Z1211 Encounter for screening for malignant neoplasm of colon: Secondary | ICD-10-CM

## 2016-05-05 LAB — HEMOCCULT SLIDES (X 3 CARDS)
FECAL OCCULT BLD: NEGATIVE
OCCULT 1: NEGATIVE
OCCULT 2: NEGATIVE
OCCULT 3: NEGATIVE
OCCULT 4: NEGATIVE
OCCULT 5: NEGATIVE

## 2016-05-15 ENCOUNTER — Telehealth: Payer: Self-pay | Admitting: Cardiovascular Disease

## 2016-05-15 NOTE — Telephone Encounter (Signed)
°  New Prob   Pt has some questions regarding follow up. Offered pt first available for [facility] and PA/NP. He is requesting to speak to a nurse.

## 2016-05-15 NOTE — Telephone Encounter (Signed)
Spoke with pt and he was suppose to follow up in October 2017.  Pt states scheduler said Dr. Angelena Form was booked out until January.  Advised pt looks like Dr. Angelena Form had a cancellation 05/22/16 at 4:30pm.  Pt would like to have appt earlier in the morning if anything is available.  Advised I would have to send message to Dr. Camillia Herter nurse, Fraser Din, to see if an earlier AM appt was available. Pt agreeable to go ahead and schedule 11/6 appt and see if Fraser Din could find earlier appt.

## 2016-05-16 NOTE — Telephone Encounter (Signed)
I spoke with pt and offered him appt on 05/18/16 at noon with Dr. Angelena Form but he would like to keep appt for 11/6.

## 2016-05-16 NOTE — Telephone Encounter (Signed)
Left message to call back  

## 2016-05-17 DIAGNOSIS — Z460 Encounter for fitting and adjustment of spectacles and contact lenses: Secondary | ICD-10-CM | POA: Diagnosis not present

## 2016-05-17 DIAGNOSIS — H25012 Cortical age-related cataract, left eye: Secondary | ICD-10-CM | POA: Diagnosis not present

## 2016-05-17 DIAGNOSIS — Z961 Presence of intraocular lens: Secondary | ICD-10-CM | POA: Diagnosis not present

## 2016-05-17 DIAGNOSIS — H2512 Age-related nuclear cataract, left eye: Secondary | ICD-10-CM | POA: Diagnosis not present

## 2016-05-22 ENCOUNTER — Encounter (INDEPENDENT_AMBULATORY_CARE_PROVIDER_SITE_OTHER): Payer: Self-pay

## 2016-05-22 ENCOUNTER — Encounter: Payer: Self-pay | Admitting: Cardiovascular Disease

## 2016-05-22 ENCOUNTER — Ambulatory Visit (INDEPENDENT_AMBULATORY_CARE_PROVIDER_SITE_OTHER): Payer: PPO | Admitting: Cardiovascular Disease

## 2016-05-22 VITALS — BP 148/64 | HR 64 | Ht 67.0 in | Wt 190.6 lb

## 2016-05-22 DIAGNOSIS — E7800 Pure hypercholesterolemia, unspecified: Secondary | ICD-10-CM

## 2016-05-22 DIAGNOSIS — I25119 Atherosclerotic heart disease of native coronary artery with unspecified angina pectoris: Secondary | ICD-10-CM

## 2016-05-22 DIAGNOSIS — I10 Essential (primary) hypertension: Secondary | ICD-10-CM

## 2016-05-22 MED ORDER — NITROGLYCERIN 0.4 MG SL SUBL: 0.4000 mg | SUBLINGUAL_TABLET | SUBLINGUAL | 6 refills | Status: DC | PRN

## 2016-05-22 NOTE — Progress Notes (Signed)
Chief Complaint  Patient presents with  . Coronary Artery Disease   History of Present Illness: 75 yo WM with history of CAD, HLD, GERD who is here today for cardiac follow up. He has been followed in the past by Dr. Harrington Challenger. He is known to have CAD with cath in 2011 showing moderate non-obstructive disease in the LAD and RCA. He has not tolerated Imdur in the past. He had bradycardia with Bystolic so it was stopped. Cardiac cath 06/04/12 with moderate stable CAD. He was started on Imdur but did not tolerate secondary to headaches. He was started on Lasix secondary to elevated filling pressures on cath. Trial of Ranexa after visit here in March 2015 but it did not help so he stopped it.   He is here today for follow up. Still has mild chest pressure at peak exertion but exercising every day. No changes over last three years. Walks 30 minutes per day, five days per week at the [facility]. Feeling well. No dizziness. No LE edema.   Primary Care Physician: Viviana Simpler, MD  Past Medical History:  Diagnosis Date  . Allergic rhinitis   . CAD (coronary artery disease)   . Chest pain   . Duodenal ulcer   . ED (erectile dysfunction)   . GERD (gastroesophageal reflux disease)   . History of colonoscopy   . Hyperlipidemia   . Osteoarthritis     Past Surgical History:  Procedure Laterality Date  . APPENDECTOMY  1964  . CARDIAC CATHETERIZATION  03/2010   diffuse early disease  . CATARACT EXTRACTION Right 04/18/2016  . INGUINAL HERNIA REPAIR Bilateral 1970   double hernia repair   . RETINAL DETACHMENT SURGERY Right 2003  . TONSILLECTOMY      Current Outpatient Prescriptions  Medication Sig Dispense Refill  . aspirin 81 MG tablet Take 81 mg by mouth daily.      . furosemide (LASIX) 40 MG tablet TAKE 1 TABLET BY MOUTH DAILY 90 tablet 2  . losartan (COZAAR) 50 MG tablet TAKE 1 TABLET BY MOUTH DAILY 90 tablet 2  . nitroGLYCERIN (NITROSTAT) 0.4 MG SL tablet Place 1 tablet (0.4 mg total) under  the tongue every 5 (five) minutes as needed for chest pain (x 3 doses). 25 tablet 6  . ranitidine (ZANTAC) 150 MG tablet TAKE 1 TABLET BY MOUTH TWICE A DAY 180 tablet 1  . simvastatin (ZOCOR) 40 MG tablet TAKE ONE TABLET BY MOUTH EACH EVENING 90 tablet 3   No current facility-administered medications for this visit.     Allergies  Allergen Reactions  . Ibuprofen     REACTION: ulcer    Social History   Social History  . Marital status: Married    Spouse name: N/A  . Number of children: 2  . Years of education: N/A   Occupational History  . Administrator, [employer] Service     retired   Social History Main Topics  . Smoking status: Former Smoker    Packs/day: 1.00    Years: 10.00    Types: Cigars    Quit date: 07/18/2007  . Smokeless tobacco: Never Used     Comment: Occ cigar - no cigarettes - stopped in 6-09  . Alcohol use 0.5 oz/week    1 Standard drinks or equivalent per week  . Drug use: No  . Sexual activity: Not on file   Other Topics Concern  . Not on file   Social History Narrative   Has living will  Wife would serve as health care POA--then son   Would accept resuscitation attempts but no prolonged artificial ventilation    No tube feeds if cognitively unaware    Family History  Problem Relation Age of Onset  . Coronary artery disease Mother     CABG in 6's  . Brain cancer Mother     that was metastaticfrom lung   . Stomach cancer Father 35  . Diabetes Sister   . Stroke Maternal Grandmother     Review of Systems:  As stated in the HPI and otherwise negative.   BP (!) 148/64   Pulse 64   Ht 5\' 7"  (1.702 m)   Wt 190 lb 9.6 oz (86.5 kg)   BMI 29.85 kg/m   Physical Examination: General: Well developed, well nourished, NAD  HEENT: OP clear, mucus membranes moist  SKIN: warm, dry. No rashes. Neuro: No focal deficits  Musculoskeletal: Muscle strength 5/5 all ext  Psychiatric: Mood and affect normal  Neck: No JVD, no carotid bruits, no thyromegaly, no  lymphadenopathy.  Lungs:Clear bilaterally, no wheezes, rhonci, crackles Cardiovascular: Loletha Grayer, regular rhythm. No murmurs, gallops or rubs. Abdomen:Soft. Bowel sounds present. Non-tender.  Extremities: No lower extremity edema. Pulses are 2 + in the bilateral DP/PT.  EKG:  EKG is not  ordered today. The ekg ordered today demonstrates   Recent Labs: No results found for requested labs within last 8760 hours.   Lipid Panel    Component Value Date/Time   CHOL 145 05/19/2015 0904   TRIG 96.0 05/19/2015 0904   HDL 50.10 05/19/2015 0904   CHOLHDL 3 05/19/2015 0904   VLDL 19.2 05/19/2015 0904   LDLCALC 76 05/19/2015 0904   LDLDIRECT 131.3 04/06/2009 0922     Wt Readings from Last 3 Encounters:  05/22/16 190 lb 9.6 oz (86.5 kg)  04/26/16 191 lb (86.6 kg)  03/24/16 191 lb (86.6 kg)     Other studies Reviewed: Additional studies/ records that were reviewed today include: . Review of the above records demonstrates:    Assessment and Plan:   1. CAD with stable angina: Stable chest pain with moderate exertion, peak exercise. Moderate disease by cath November 2013. He did not tolerate Imdur secondary to headache. No beta blockers secondary to bradycardia. Continue ASA, statin, ARB. He is having stable anginal type pains at peak exercise, no changes. No improvement with Ranexa. He does not tolerate Imdur. Refill SL NTG.   2. HTN: BP controlled at home. Continue ARB.   3. Hyperlipidemia: He is on a statin. Lipids well controlled in primary care  4. Sinus Bradycardia: No dizziness. Will not start a beta blocker.  Current medicines are reviewed at length with the patient today.  The patient does not have concerns regarding medicines.  The following changes have been made:  no change  Labs/ tests ordered today include:   No orders of the defined types were placed in this encounter.   Disposition:   FU with me  in 12 months  Signed, Lauree Chandler, MD 05/22/2016 4:41 PM      [facility] [address], [place], [place]  [address] Phone: [phone]; Fax: [phone]

## 2016-05-22 NOTE — Patient Instructions (Signed)
Medication Instructions:  Your physician recommends that you continue on your current medications as directed. Please refer to the Current Medication list given to you today.   Labwork: none  Testing/Procedures: none  Follow-Up: Your physician recommends that you schedule a follow-up appointment in: 12 months.  Please call our office in July or August to schedule this appointment.     Any Other Special Instructions Will Be Listed Below (If Applicable).     If you need a refill on your cardiac medications before your next appointment, please call your pharmacy.

## 2016-05-23 ENCOUNTER — Ambulatory Visit (INDEPENDENT_AMBULATORY_CARE_PROVIDER_SITE_OTHER): Payer: PPO | Admitting: Internal Medicine

## 2016-05-23 ENCOUNTER — Encounter: Payer: Self-pay | Admitting: Internal Medicine

## 2016-05-23 VITALS — BP 130/78 | HR 49 | Ht 66.0 in | Wt 189.0 lb

## 2016-05-23 DIAGNOSIS — E785 Hyperlipidemia, unspecified: Secondary | ICD-10-CM | POA: Diagnosis not present

## 2016-05-23 DIAGNOSIS — Z23 Encounter for immunization: Secondary | ICD-10-CM | POA: Diagnosis not present

## 2016-05-23 DIAGNOSIS — Z0000 Encounter for general adult medical examination without abnormal findings: Secondary | ICD-10-CM | POA: Diagnosis not present

## 2016-05-23 DIAGNOSIS — I25119 Atherosclerotic heart disease of native coronary artery with unspecified angina pectoris: Secondary | ICD-10-CM

## 2016-05-23 DIAGNOSIS — K219 Gastro-esophageal reflux disease without esophagitis: Secondary | ICD-10-CM | POA: Diagnosis not present

## 2016-05-23 DIAGNOSIS — Z7189 Other specified counseling: Secondary | ICD-10-CM | POA: Diagnosis not present

## 2016-05-23 LAB — CBC WITH DIFFERENTIAL/PLATELET
BASOS ABS: 0 10*3/uL (ref 0.0–0.1)
BASOS PCT: 0.5 % (ref 0.0–3.0)
EOS ABS: 0.1 10*3/uL (ref 0.0–0.7)
Eosinophils Relative: 1.7 % (ref 0.0–5.0)
HCT: 44.8 % (ref 39.0–52.0)
HEMOGLOBIN: 15.2 g/dL (ref 13.0–17.0)
LYMPHS PCT: 26.5 % (ref 12.0–46.0)
Lymphs Abs: 2.1 10*3/uL (ref 0.7–4.0)
MCHC: 34 g/dL (ref 30.0–36.0)
MCV: 87.8 fl (ref 78.0–100.0)
MONO ABS: 0.7 10*3/uL (ref 0.1–1.0)
Monocytes Relative: 9.2 % (ref 3.0–12.0)
Neutro Abs: 4.8 10*3/uL (ref 1.4–7.7)
Neutrophils Relative %: 62.1 % (ref 43.0–77.0)
PLATELETS: 245 10*3/uL (ref 150.0–400.0)
RBC: 5.1 Mil/uL (ref 4.22–5.81)
RDW: 12.8 % (ref 11.5–15.5)
WBC: 7.8 10*3/uL (ref 4.0–10.5)

## 2016-05-23 LAB — LIPID PANEL
CHOLESTEROL: 142 mg/dL (ref 0–200)
HDL: 46.6 mg/dL (ref >39.00)
LDL CALC: 70 mg/dL (ref 0–99)
NonHDL: 95.09
TRIGLYCERIDES: 125 mg/dL (ref 0.0–149.0)
Total CHOL/HDL Ratio: 3
VLDL: 25 mg/dL (ref 0.0–40.0)

## 2016-05-23 LAB — COMPREHENSIVE METABOLIC PANEL
ALBUMIN: 4.5 g/dL (ref 3.5–5.2)
ALT: 14 U/L (ref 0–53)
AST: 16 U/L (ref 0–37)
Alkaline Phosphatase: 76 U/L (ref 39–117)
BILIRUBIN TOTAL: 0.8 mg/dL (ref 0.2–1.2)
BUN: 27 mg/dL — AB (ref 6–23)
CALCIUM: 9.6 mg/dL (ref 8.4–10.5)
CHLORIDE: 101 meq/L (ref 96–112)
CO2: 30 mEq/L (ref 19–32)
CREATININE: 1.05 mg/dL (ref 0.40–1.50)
GFR: 72.84 mL/min (ref >60.00)
Glucose, Bld: 101 mg/dL — ABNORMAL HIGH (ref 70–99)
Potassium: 4.1 mEq/L (ref 3.5–5.1)
SODIUM: 137 meq/L (ref 135–145)
TOTAL PROTEIN: 7.1 g/dL (ref 6.0–8.3)

## 2016-05-23 NOTE — Assessment & Plan Note (Signed)
No problems with med.

## 2016-05-23 NOTE — Assessment & Plan Note (Signed)
Stable pattern of DOE, etc No change indicated Recent cardiology evaluation

## 2016-05-23 NOTE — Patient Instructions (Signed)
If you have more frequent night acid problems, you can try 2 of the ranitidine (300mg ) in the evening.

## 2016-05-23 NOTE — Addendum Note (Signed)
Addended by: Pilar Grammes on: 05/23/2016 09:15 AM   Modules accepted: Orders

## 2016-05-23 NOTE — Assessment & Plan Note (Signed)
See social history 

## 2016-05-23 NOTE — Progress Notes (Signed)
Pre visit review using our clinic review tool, if applicable. No additional management support is needed unless otherwise documented below in the visit note. 

## 2016-05-23 NOTE — Assessment & Plan Note (Signed)
Discussed increasing evening ranitidine if increased symptoms

## 2016-05-23 NOTE — Assessment & Plan Note (Signed)
I have personally reviewed the Medicare Annual Wellness questionnaire and have noted 1. The patient's medical and social history 2. Their use of alcohol, tobacco or illicit drugs 3. Their current medications and supplements 4. The patient's functional ability including ADL's, fall risks, home safety risks and hearing or visual             impairment. 5. Diet and physical activities 6. Evidence for depression or mood disorders  The patients weight, height, BMI and visual acuity have been recorded in the chart I have made referrals, counseling and provided education to the patient based review of the above and I have provided the pt with a written personalized care plan for preventive services.  I have provided you with a copy of your personalized plan for preventive services. Please take the time to review along with your updated medication list.  No PSA due to age Flu vaccine today Recent fecal test Works out regularly

## 2016-05-23 NOTE — Progress Notes (Signed)
Subjective:    Patient ID: Jonathan Griffith, male    DOB: 08-May-1941, 75 y.o.   MRN: [id number]  HPI Here for Medicare wellness and follow up of chronic health conditions Reviewed form and advanced directives  Reviewed other doctors Exercises regularly No tobacco Still enjoys 1-2 drinks a day Had one fall--tripped on hose. No injury Had cataract surgery--well pleased. Getting the other one done soon Hearing is okay---occasional tinnitus Independent with instrumental ADLs No depression or anhedonia No sig memory issues  Still having acid reflux Mostly symptoms at night--gets reflux that wakes him up Once or twice a month--- choking sensation Will sit up briefly--and takes tums at times Using the zantac bid No swallowing problems Does have the [facility] raised up  Still gets his same DOE--- pushing it on treadmill, pushing lawnmower,etc Hasn't tolerated any treatments Continues to exercise regularly No dizziness or syncope No regular palpitations-- ?one episode at night in past year  Current Outpatient Prescriptions on File Prior to Visit  Medication Sig Dispense Refill  . aspirin 81 MG tablet Take 81 mg by mouth daily.      . furosemide (LASIX) 40 MG tablet TAKE 1 TABLET BY MOUTH DAILY 90 tablet 2  . losartan (COZAAR) 50 MG tablet TAKE 1 TABLET BY MOUTH DAILY 90 tablet 2  . nitroGLYCERIN (NITROSTAT) 0.4 MG SL tablet Place 1 tablet (0.4 mg total) under the tongue every 5 (five) minutes as needed for chest pain (x 3 doses). 25 tablet 6  . ranitidine (ZANTAC) 150 MG tablet TAKE 1 TABLET BY MOUTH TWICE A DAY 180 tablet 1  . simvastatin (ZOCOR) 40 MG tablet TAKE ONE TABLET BY MOUTH EACH EVENING 90 tablet 3   No current facility-administered medications on file prior to visit.     Allergies  Allergen Reactions  . Ibuprofen     REACTION: ulcer    Past Medical History:  Diagnosis Date  . Allergic rhinitis   . CAD (coronary artery disease)   . Chest pain   . Duodenal ulcer   .  ED (erectile dysfunction)   . GERD (gastroesophageal reflux disease)   . History of colonoscopy   . Hyperlipidemia   . Osteoarthritis     Past Surgical History:  Procedure Laterality Date  . APPENDECTOMY  1964  . CARDIAC CATHETERIZATION  03/2010   diffuse early disease  . CATARACT EXTRACTION Right 04/18/2016  . INGUINAL HERNIA REPAIR Bilateral 1970   double hernia repair   . RETINAL DETACHMENT SURGERY Right 2003  . TONSILLECTOMY      Family History  Problem Relation Age of Onset  . Coronary artery disease Mother     CABG in 25's  . Brain cancer Mother     that was metastaticfrom lung   . Stomach cancer Father 33  . Diabetes Sister   . Stroke Maternal Grandmother     Social History   Social History  . Marital status: Married    Spouse name: N/A  . Number of children: 2  . Years of education: N/A   Occupational History  . Administrator, [employer] Service     retired   Social History Main Topics  . Smoking status: Former Smoker    Packs/day: 1.00    Years: 10.00    Types: Cigars    Quit date: 07/18/2007  . Smokeless tobacco: Never Used     Comment: Occ cigar - no cigarettes - stopped in 6-09  . Alcohol use 0.5 oz/week    1 Standard  drinks or equivalent per week  . Drug use: No  . Sexual activity: Not on file   Other Topics Concern  . Not on file   Social History Narrative   Has living will   Wife would serve as health care POA--then son   Would accept resuscitation attempts but no prolonged artificial ventilation    No tube feeds if cognitively unaware   Review of Systems  no recurrence of toe pain Appetite is fine Weight is stable Sleep is still not great---variable. Trouble with awakening. Not using any meds Teeth okay-- full dentures. Wears seat belt Bowels are fine. Recent fecal testing negative Voids okay Some hand pain--no meds. No other sig back or joint pain No rash or suspicious lesions    Objective:   Physical Exam  Constitutional: He is oriented to  person, place, and time. He appears well-developed and well-nourished. No distress.  HENT:  Mouth/Throat: Oropharynx is clear and moist. No oropharyngeal exudate.  Neck: Normal range of motion. Neck supple. No thyromegaly present.  Cardiovascular: Normal rate, regular rhythm, normal heart sounds and intact distal pulses.  Exam reveals no gallop.   No murmur heard. Pulmonary/Chest: Effort normal and breath sounds normal. No respiratory distress. He has no wheezes. He has no rales.  Abdominal: Soft. There is no tenderness.  Musculoskeletal: He exhibits no edema or tenderness.  Lymphadenopathy:    He has no cervical adenopathy.  Neurological: He is alert and oriented to person, place, and time.  President-- "Daisy Floro, Obama, Bush" [phone] Not good with spelling Recall 3/3  Skin: No rash noted. No erythema.  Psychiatric: He has a normal mood and affect. His behavior is normal.          Assessment & Plan:

## 2016-06-05 ENCOUNTER — Other Ambulatory Visit: Payer: Self-pay | Admitting: Internal Medicine

## 2016-06-13 DIAGNOSIS — H2512 Age-related nuclear cataract, left eye: Secondary | ICD-10-CM | POA: Diagnosis not present

## 2016-06-13 DIAGNOSIS — H25012 Cortical age-related cataract, left eye: Secondary | ICD-10-CM | POA: Diagnosis not present

## 2016-06-13 DIAGNOSIS — H25812 Combined forms of age-related cataract, left eye: Secondary | ICD-10-CM | POA: Diagnosis not present

## 2016-06-27 DIAGNOSIS — Z460 Encounter for fitting and adjustment of spectacles and contact lenses: Secondary | ICD-10-CM | POA: Diagnosis not present

## 2016-10-05 ENCOUNTER — Telehealth: Payer: Self-pay | Admitting: Cardiovascular Disease

## 2016-10-05 NOTE — Telephone Encounter (Signed)
New Message:    Please call,pt says he needs to ask you a question.No other information was given.

## 2016-10-05 NOTE — Telephone Encounter (Signed)
I spoke with pt. He reports chest pain with exertion such as walking the dog or walking up a hill. Has been occurring daily whenever he exerts himself for about 2 weeks.  He has NTG but has not needed to use.  Pain goes away with rest. I scheduled pt to see Dr. Angelena Form on March 26,2018 at 12:00.  I instructed pt to go to ED if symptoms worsen prior to this appt.

## 2016-10-05 NOTE — Telephone Encounter (Signed)
I reviewed with Dr. Angelena Form and if this is change for pt he should go to ED for evaluation and possible admission. I spoke with pt who states pain is only a little worse than it has been.  Notes it "comes on a little quicker."  He does not want to go to ED and feels he is fine to wait until appt on Monday.  I told him again if symptoms worsen prior to appt he should go to ED.

## 2016-10-06 NOTE — Telephone Encounter (Signed)
Thanks

## 2016-10-09 ENCOUNTER — Encounter: Payer: Self-pay | Admitting: *Deleted

## 2016-10-09 ENCOUNTER — Ambulatory Visit (INDEPENDENT_AMBULATORY_CARE_PROVIDER_SITE_OTHER): Payer: PPO | Admitting: Cardiovascular Disease

## 2016-10-09 ENCOUNTER — Encounter: Payer: Self-pay | Admitting: Cardiovascular Disease

## 2016-10-09 DIAGNOSIS — I25110 Atherosclerotic heart disease of native coronary artery with unstable angina pectoris: Secondary | ICD-10-CM

## 2016-10-09 DIAGNOSIS — E7800 Pure hypercholesterolemia, unspecified: Secondary | ICD-10-CM

## 2016-10-09 DIAGNOSIS — I10 Essential (primary) hypertension: Secondary | ICD-10-CM | POA: Diagnosis not present

## 2016-10-09 NOTE — Patient Instructions (Signed)
Medication Instructions:  Your physician recommends that you continue on your current medications as directed. Please refer to the Current Medication list given to you today.   Labwork: Lab work to be done today--BMP, CBC, PT  Testing/Procedures: Your physician has requested that you have a cardiac catheterization. Cardiac catheterization is used to diagnose and/or treat various heart conditions. Doctors may recommend this procedure for a number of different reasons. The most common reason is to evaluate chest pain. Chest pain can be a symptom of coronary artery disease (CAD), and cardiac catheterization can show whether plaque is narrowing or blocking your heart's arteries. This procedure is also used to evaluate the valves, as well as measure the blood flow and oxygen levels in different parts of your heart. For further information please visit [email or web address]. Please follow instruction sheet, as given. Scheduled for March 29,2018   Follow-Up: Your physician recommends that you schedule a follow-up appointment in: 4 weeks with PA or NP    Any Other Special Instructions Will Be Listed Below (If Applicable).     If you need a refill on your cardiac medications before your next appointment, please call your pharmacy.

## 2016-10-09 NOTE — Progress Notes (Addendum)
Chief Complaint  Patient presents with  . Chest Pain   History of Present Illness: 76 yo WM with history of CAD, HLD, GERD who is here today for cardiac follow up. He is known to have moderate CAD with cath in November 2013 showing moderate disease in the LAD and RCA. He has not tolerated Imdur in the past. He had bradycardia with Bystolic so it was stopped. He was started on Lasix secondary to elevated filling pressures on cath. Trial of Ranexa after visit here in March 2015 but it did not help so he stopped it.   He is here today for follow up. He is added onto the schedule with c/o chest pain. He tells me that he is having chest pressure and dyspnea with minimal exertion. Central chest pressure. No dizziness or syncope. No LE edema.   Primary Care Physician: Viviana Simpler, MD  Past Medical History:  Diagnosis Date  . Allergic rhinitis   . CAD (coronary artery disease)   . Chest pain   . Duodenal ulcer   . ED (erectile dysfunction)   . GERD (gastroesophageal reflux disease)   . History of colonoscopy   . Hyperlipidemia   . Osteoarthritis     Past Surgical History:  Procedure Laterality Date  . APPENDECTOMY  1964  . CARDIAC CATHETERIZATION  03/2010   diffuse early disease  . CATARACT EXTRACTION Right 04/18/2016  . INGUINAL HERNIA REPAIR Bilateral 1970   double hernia repair   . RETINAL DETACHMENT SURGERY Right 2003  . TONSILLECTOMY      Current Outpatient Prescriptions  Medication Sig Dispense Refill  . aspirin 81 MG tablet Take 81 mg by mouth daily.      . furosemide (LASIX) 40 MG tablet TAKE 1 TABLET BY MOUTH DAILY 90 tablet 2  . losartan (COZAAR) 50 MG tablet TAKE 1 TABLET BY MOUTH DAILY 90 tablet 2  . nitroGLYCERIN (NITROSTAT) 0.4 MG SL tablet Place 1 tablet (0.4 mg total) under the tongue every 5 (five) minutes as needed for chest pain (x 3 doses). 25 tablet 6  . ranitidine (ZANTAC) 150 MG tablet Take 150 mg by mouth daily.    . simvastatin (ZOCOR) 40 MG  tablet TAKE ONE TABLET BY MOUTH EACH EVENING 90 tablet 3   No current facility-administered medications for this visit.     Allergies  Allergen Reactions  . Ibuprofen     REACTION: ulcer    Social History   Social History  . Marital status: Married    Spouse name: N/A  . Number of children: 2  . Years of education: N/A   Occupational History  . Administrator, Civil Service     retired   Social History Main Topics  . Smoking status: Former Smoker    Packs/day: 1.00    Years: 10.00    Types: Cigars    Quit date: 07/18/2007  . Smokeless tobacco: Never Used     Comment: Occ cigar - no cigarettes - stopped in 6-09  . Alcohol use 0.5 oz/week    1 Standard drinks or equivalent per week  . Drug use: No  . Sexual activity: Not on file   Other Topics Concern  . Not on file   Social History Narrative   Has living will   Wife would serve as health care POA--then son   Would accept resuscitation attempts but no prolonged artificial ventilation    No tube feeds if cognitively unaware    Family History  Problem  Relation Age of Onset  . Coronary artery disease Mother     CABG in 53's  . Brain cancer Mother     that was metastaticfrom lung   . Stomach cancer Father 1  . Diabetes Sister   . Stroke Maternal Grandmother     Review of Systems:  As stated in the HPI and otherwise negative.   BP 122/60   Pulse (!) 55   Ht 5\' 6"  (1.676 m)   Wt 196 lb 9.6 oz (89.2 kg)   BMI 31.73 kg/m   Physical Examination: General: Well developed, well nourished, NAD  HEENT: OP clear, mucus membranes moist  SKIN: warm, dry. No rashes. Neuro: No focal deficits  Musculoskeletal: Muscle strength 5/5 all ext  Psychiatric: Mood and affect normal  Neck: No JVD, no carotid bruits, no thyromegaly, no lymphadenopathy.  Lungs:Clear bilaterally, no wheezes, rhonci, crackles Cardiovascular: Loletha Grayer, regular rhythm. No murmurs, gallops or rubs. Abdomen:Soft. Bowel sounds present. Non-tender.  Extremities: No  lower extremity edema. Pulses are 2 + in the bilateral DP/PT.  EKG:  EKG is ordered today. The ekg ordered today demonstrates Sinus brady, rate 55 bpm. 1st degree AV block. Poor R wave progression, chronic.   Recent Labs: 05/23/2016: ALT 14; BUN 27; Creatinine, Ser 1.05; Hemoglobin 15.2; Platelets 245.0; Potassium 4.1; Sodium 137   Lipid Panel    Component Value Date/Time   CHOL 142 05/23/2016 0917   TRIG 125.0 05/23/2016 0917   HDL 46.60 05/23/2016 0917   CHOLHDL 3 05/23/2016 0917   VLDL 25.0 05/23/2016 0917   LDLCALC 70 05/23/2016 0917   LDLDIRECT 131.3 04/06/2009 0922     Wt Readings from Last 3 Encounters:  10/09/16 196 lb 9.6 oz (89.2 kg)  05/23/16 189 lb (85.7 kg)  05/22/16 190 lb 9.6 oz (86.5 kg)     Other studies Reviewed: Additional studies/ records that were reviewed today include: . Review of the above records demonstrates:    Assessment and Plan:   1. CAD with unstable angina: He is having more chest pain at minimal exertion. Moderate disease by cath November 2013. He did not tolerate Imdur secondary to headache and Ranexa did not help so he stopped it. No beta blockers secondary to bradycardia.  -Will plan cardiac [facility] 10[facility]. Risks of procedure reviewed with pt today. Continue ASA, statin, ARB.  -Pre-cath labs today  2. HTN: BP controlled at home. Continue ARB.   3. Hyperlipidemia: He is on a statin. Lipids well controlled in primary care  4. Sinus Bradycardia: No dizziness. Will avoid AV nodal blocking agents.   Current medicines are reviewed at length with the patient today.  The patient does not have concerns regarding medicines.  The following changes have been made:  no change  Labs/ tests ordered today include:   No orders of the defined types were placed in this encounter.   Disposition:   FU with me  3 weeks after cath.   Signed, Lauree Chandler, MD 10/09/2016 12:39 PM    [facility] [address],  [place], [place]  [address] Phone: [phone]; Fax: [phone]

## 2016-10-09 NOTE — Addendum Note (Signed)
Addended by: Lauree Chandler D on: 10/09/2016 01:44 PM   Modules accepted: Orders, SmartSet

## 2016-10-10 LAB — CBC WITH DIFFERENTIAL/PLATELET
BASOS: 1 %
Basophils Absolute: 0 10*3/uL (ref 0.0–0.2)
EOS (ABSOLUTE): 0.1 10*3/uL (ref 0.0–0.4)
EOS: 1 %
HEMATOCRIT: 44 % (ref 37.5–51.0)
HEMOGLOBIN: 15.2 g/dL (ref 13.0–17.7)
IMMATURE GRANS (ABS): 0 10*3/uL (ref 0.0–0.1)
Immature Granulocytes: 0 %
LYMPHS: 34 %
Lymphocytes Absolute: 2 10*3/uL (ref 0.7–3.1)
MCH: 29.9 pg (ref 26.6–33.0)
MCHC: 34.5 g/dL (ref 31.5–35.7)
MCV: 86 fL (ref 79–97)
MONOCYTES: 12 %
Monocytes Absolute: 0.7 10*3/uL (ref 0.1–0.9)
NEUTROS ABS: 3 10*3/uL (ref 1.4–7.0)
Neutrophils: 52 %
Platelets: 236 10*3/uL (ref 150–379)
RBC: 5.09 x10E6/uL (ref 4.14–5.80)
RDW: 13.4 % (ref 12.3–15.4)
WBC: 5.8 10*3/uL (ref 3.4–10.8)

## 2016-10-10 LAB — BASIC METABOLIC PANEL
BUN / CREAT RATIO: 22 (ref 10–24)
BUN: 22 mg/dL (ref 8–27)
CALCIUM: 9.2 mg/dL (ref 8.6–10.2)
CHLORIDE: 98 mmol/L (ref 96–106)
CO2: 27 mmol/L (ref 18–29)
CREATININE: 1.02 mg/dL (ref 0.76–1.27)
GFR calc Af Amer: 82 mL/min/{1.73_m2} (ref >59)
GFR calc non Af Amer: 71 mL/min/{1.73_m2} (ref >59)
Glucose: 112 mg/dL — ABNORMAL HIGH (ref 65–99)
Potassium: 4.3 mmol/L (ref 3.5–5.2)
Sodium: 140 mmol/L (ref 134–144)

## 2016-10-10 LAB — PROTIME-INR
INR: 1.1 (ref 0.8–1.2)
Prothrombin Time: 11.2 s (ref 9.1–12.0)

## 2016-10-12 ENCOUNTER — Ambulatory Visit (HOSPITAL_COMMUNITY)
Admission: RE | Admit: 2016-10-12 | Discharge: 2016-10-12 | Disposition: A | Discharge status: Home / Self Care | Payer: PPO | Source: Ambulatory Visit | Attending: Cardiovascular Disease | Admitting: Cardiovascular Disease

## 2016-10-12 ENCOUNTER — Ambulatory Visit (HOSPITAL_COMMUNITY)
Admission: RE | Disposition: A | Discharge status: Home / Self Care | Payer: Self-pay | Source: Ambulatory Visit | Attending: Cardiovascular Disease

## 2016-10-12 DIAGNOSIS — Z8249 Family history of ischemic heart disease and other diseases of the circulatory system: Secondary | ICD-10-CM | POA: Insufficient documentation

## 2016-10-12 DIAGNOSIS — I2584 Coronary atherosclerosis due to calcified coronary lesion: Secondary | ICD-10-CM | POA: Insufficient documentation

## 2016-10-12 DIAGNOSIS — M1990 Unspecified osteoarthritis, unspecified site: Secondary | ICD-10-CM | POA: Insufficient documentation

## 2016-10-12 DIAGNOSIS — E785 Hyperlipidemia, unspecified: Secondary | ICD-10-CM | POA: Insufficient documentation

## 2016-10-12 DIAGNOSIS — Z87891 Personal history of nicotine dependence: Secondary | ICD-10-CM | POA: Insufficient documentation

## 2016-10-12 DIAGNOSIS — I25110 Atherosclerotic heart disease of native coronary artery with unstable angina pectoris: Secondary | ICD-10-CM

## 2016-10-12 DIAGNOSIS — I10 Essential (primary) hypertension: Secondary | ICD-10-CM | POA: Diagnosis not present

## 2016-10-12 DIAGNOSIS — I440 Atrioventricular block, first degree: Secondary | ICD-10-CM | POA: Insufficient documentation

## 2016-10-12 DIAGNOSIS — K219 Gastro-esophageal reflux disease without esophagitis: Secondary | ICD-10-CM | POA: Diagnosis not present

## 2016-10-12 DIAGNOSIS — Z7982 Long term (current) use of aspirin: Secondary | ICD-10-CM | POA: Diagnosis not present

## 2016-10-12 HISTORY — PX: LEFT HEART CATH AND CORONARY ANGIOGRAPHY: CATH118249

## 2016-10-12 SURGERY — LEFT HEART CATH AND CORONARY ANGIOGRAPHY
Anesthesia: LOCAL

## 2016-10-12 MED ORDER — HEPARIN (PORCINE) IN NACL 2-0.9 UNIT/ML-% IJ SOLN
INTRAMUSCULAR | Status: DC | PRN
  Administered 2016-10-12: 1000 mL

## 2016-10-12 MED ORDER — ASPIRIN 81 MG PO CHEW
CHEWABLE_TABLET | ORAL | Status: AC
  Filled 2016-10-12: qty 1

## 2016-10-12 MED ORDER — LIDOCAINE HCL (PF) 1 % IJ SOLN
INTRAMUSCULAR | Status: DC | PRN
  Administered 2016-10-12: 2 mL

## 2016-10-12 MED ORDER — ASPIRIN 81 MG PO CHEW
81.0000 mg | CHEWABLE_TABLET | ORAL | Status: AC
  Administered 2016-10-12: 81 mg via ORAL

## 2016-10-12 MED ORDER — VERAPAMIL HCL 2.5 MG/ML IV SOLN
INTRAVENOUS | Status: DC | PRN
  Administered 2016-10-12: 10 mL via INTRA_ARTERIAL

## 2016-10-12 MED ORDER — SODIUM CHLORIDE 0.9% FLUSH: 3.0000 mL | INTRAVENOUS | Status: DC | PRN

## 2016-10-12 MED ORDER — HEPARIN SODIUM (PORCINE) 1000 UNIT/ML IJ SOLN
INTRAMUSCULAR | Status: DC | PRN
  Administered 2016-10-12: 4500 [IU] via INTRAVENOUS

## 2016-10-12 MED ORDER — SODIUM CHLORIDE 0.9 % IV SOLN: 250.0000 mL | INTRAVENOUS | Status: DC | PRN

## 2016-10-12 MED ORDER — SODIUM CHLORIDE 0.9 % IV SOLN
INTRAVENOUS | Status: AC
  Administered 2016-10-12: 11:00:00 via INTRAVENOUS

## 2016-10-12 MED ORDER — MIDAZOLAM HCL 2 MG/2ML IJ SOLN
INTRAMUSCULAR | Status: AC
  Filled 2016-10-12: qty 2

## 2016-10-12 MED ORDER — SODIUM CHLORIDE 0.9% FLUSH: 3.0000 mL | Freq: Two times a day (BID) | INTRAVENOUS | Status: DC

## 2016-10-12 MED ORDER — VERAPAMIL HCL 2.5 MG/ML IV SOLN
INTRAVENOUS | Status: AC
  Filled 2016-10-12: qty 2

## 2016-10-12 MED ORDER — HEPARIN (PORCINE) IN NACL 2-0.9 UNIT/ML-% IJ SOLN
INTRAMUSCULAR | Status: AC
  Filled 2016-10-12: qty 1000

## 2016-10-12 MED ORDER — IOPAMIDOL (ISOVUE-370) INJECTION 76%
INTRAVENOUS | Status: AC
  Filled 2016-10-12: qty 100

## 2016-10-12 MED ORDER — IOPAMIDOL (ISOVUE-370) INJECTION 76%
INTRAVENOUS | Status: DC | PRN
  Administered 2016-10-12: 90 mL via INTRA_ARTERIAL

## 2016-10-12 MED ORDER — MIDAZOLAM HCL 2 MG/2ML IJ SOLN
INTRAMUSCULAR | Status: DC | PRN
  Administered 2016-10-12: 2 mg via INTRAVENOUS

## 2016-10-12 MED ORDER — HEPARIN SODIUM (PORCINE) 1000 UNIT/ML IJ SOLN
INTRAMUSCULAR | Status: AC
  Filled 2016-10-12: qty 1

## 2016-10-12 MED ORDER — SODIUM CHLORIDE 0.9 % IV SOLN: INTRAVENOUS | Status: AC

## 2016-10-12 MED ORDER — FENTANYL CITRATE (PF) 100 MCG/2ML IJ SOLN
INTRAMUSCULAR | Status: DC | PRN
  Administered 2016-10-12: 50 ug via INTRAVENOUS

## 2016-10-12 MED ORDER — LIDOCAINE HCL (PF) 1 % IJ SOLN
INTRAMUSCULAR | Status: AC
  Filled 2016-10-12: qty 30

## 2016-10-12 MED ORDER — FENTANYL CITRATE (PF) 100 MCG/2ML IJ SOLN
INTRAMUSCULAR | Status: AC
  Filled 2016-10-12: qty 2

## 2016-10-12 SURGICAL SUPPLY — 10 items
CATH 5FR JL3.5 JR4 ANG PIG MP (CATHETERS) ×1 IMPLANT
DEVICE RAD COMP TR BAND LRG (VASCULAR PRODUCTS) ×1 IMPLANT
GLIDESHEATH SLEND SS 6F .021 (SHEATH) ×1 IMPLANT
GUIDEWIRE INQWIRE 1.5J.035X260 (WIRE) IMPLANT
INQWIRE 1.5J .035X260CM (WIRE) ×2
KIT HEART LEFT (KITS) ×2 IMPLANT
PACK CARDIAC CATHETERIZATION (CUSTOM PROCEDURE TRAY) ×2 IMPLANT
SYR MEDRAD MARK V 150ML (SYRINGE) ×2 IMPLANT
TRANSDUCER W/STOPCOCK (MISCELLANEOUS) ×2 IMPLANT
TUBING CIL FLEX 10 FLL-RA (TUBING) ×2 IMPLANT

## 2016-10-12 NOTE — Discharge Instructions (Signed)
Radial Site Care °Refer to this sheet in the next few weeks. These instructions provide you with information about caring for yourself after your procedure. Your health care provider may also give you more specific instructions. Your treatment has been planned according to current medical practices, but problems sometimes occur. Call your health care provider if you have any problems or questions after your procedure. °What can I expect after the procedure? °After your procedure, it is typical to have the following: °· Bruising at the radial site that usually fades within 1-2 weeks. °· Blood collecting in the tissue (hematoma) that may be painful to the touch. It should usually decrease in size and tenderness within 1-2 weeks. °Follow these instructions at home: °· Take medicines only as directed by your health care provider. °· You may shower 24-48 hours after the procedure or as directed by your health care provider. Remove the bandage (dressing) and gently wash the site with plain soap and water. Pat the area dry with a clean towel. Do not rub the site, because this may cause bleeding. °· Do not take baths, swim, or use a hot tub until your health care provider approves. °· Check your insertion site every day for redness, swelling, or drainage. °· Do not apply powder or lotion to the site. °· Do not flex or bend the affected arm for 24 hours or as directed by your health care provider. °· Do not push or pull heavy objects with the affected arm for 24 hours or as directed by your health care provider. °· Do not lift over 10 lb (4.5 kg) for 5 days after your procedure or as directed by your health care provider. °· Ask your health care provider when it is okay to: °¨ Return to work or school. °¨ Resume usual physical activities or sports. °¨ Resume sexual activity. °· Do not drive home if you are discharged the same day as the procedure. Have someone else drive you. °· You may drive 24 hours after the procedure  unless otherwise instructed by your health care provider. °· Do not operate machinery or power tools for 24 hours after the procedure. °· If your procedure was done as an outpatient procedure, which means that you went home the same day as your procedure, a responsible adult should be with you for the first 24 hours after you arrive home. °· Keep all follow-up visits as directed by your health care provider. This is important. °Contact a health care provider if: °· You have a fever. °· You have chills. °· You have increased bleeding from the radial site. Hold pressure on the site. CALL 911 °Get help right away if: °· You have unusual pain at the radial site. °· You have redness, warmth, or swelling at the radial site. °· You have drainage (other than a small amount of blood on the dressing) from the radial site. °· The radial site is bleeding, and the bleeding does not stop after 30 minutes of holding steady pressure on the site. °· Your arm or hand becomes pale, cool, tingly, or numb. °This information is not intended to replace advice given to you by your health care provider. Make sure you discuss any questions you have with your health care provider. °Document Released: 08/05/2010 Document Revised: 12/09/2015 Document Reviewed: 01/19/2014 °Elsevier Interactive Patient Education © 2017 Elsevier Inc. ° ° °

## 2016-10-12 NOTE — H&P (View-Only) (Signed)
Chief Complaint  Patient presents with  . Chest Pain   History of Present Illness: 76 yo WM with history of CAD, HLD, GERD who is here today for cardiac follow up. He is known to have moderate CAD with cath in November 2013 showing moderate disease in the LAD and RCA. He has not tolerated Imdur in the past. He had bradycardia with Bystolic so it was stopped. He was started on Lasix secondary to elevated filling pressures on cath. Trial of Ranexa after visit here in March 2015 but it did not help so he stopped it.   He is here today for follow up. He is added onto the schedule with c/o chest pain. He tells me that he is having chest pressure and dyspnea with minimal exertion. Central chest pressure. No dizziness or syncope. No LE edema.   Primary Care Physician: Viviana Simpler, MD  Past Medical History:  Diagnosis Date  . Allergic rhinitis   . CAD (coronary artery disease)   . Chest pain   . Duodenal ulcer   . ED (erectile dysfunction)   . GERD (gastroesophageal reflux disease)   . History of colonoscopy   . Hyperlipidemia   . Osteoarthritis     Past Surgical History:  Procedure Laterality Date  . APPENDECTOMY  1964  . CARDIAC CATHETERIZATION  03/2010   diffuse early disease  . CATARACT EXTRACTION Right 04/18/2016  . INGUINAL HERNIA REPAIR Bilateral 1970   double hernia repair   . RETINAL DETACHMENT SURGERY Right 2003  . TONSILLECTOMY      Current Outpatient Prescriptions  Medication Sig Dispense Refill  . aspirin 81 MG tablet Take 81 mg by mouth daily.      . furosemide (LASIX) 40 MG tablet TAKE 1 TABLET BY MOUTH DAILY 90 tablet 2  . losartan (COZAAR) 50 MG tablet TAKE 1 TABLET BY MOUTH DAILY 90 tablet 2  . nitroGLYCERIN (NITROSTAT) 0.4 MG SL tablet Place 1 tablet (0.4 mg total) under the tongue every 5 (five) minutes as needed for chest pain (x 3 doses). 25 tablet 6  . ranitidine (ZANTAC) 150 MG tablet Take 150 mg by mouth daily.    . simvastatin (ZOCOR) 40 MG  tablet TAKE ONE TABLET BY MOUTH EACH EVENING 90 tablet 3   No current facility-administered medications for this visit.     Allergies  Allergen Reactions  . Ibuprofen     REACTION: ulcer    Social History   Social History  . Marital status: Married    Spouse name: N/A  . Number of children: 2  . Years of education: N/A   Occupational History  . Administrator, [employer] Service     retired   Social History Main Topics  . Smoking status: Former Smoker    Packs/day: 1.00    Years: 10.00    Types: Cigars    Quit date: 07/18/2007  . Smokeless tobacco: Never Used     Comment: Occ cigar - no cigarettes - stopped in 6-09  . Alcohol use 0.5 oz/week    1 Standard drinks or equivalent per week  . Drug use: No  . Sexual activity: Not on file   Other Topics Concern  . Not on file   Social History Narrative   Has living will   Wife would serve as health care POA--then son   Would accept resuscitation attempts but no prolonged artificial ventilation    No tube feeds if cognitively unaware    Family History  Problem  Relation Age of Onset  . Coronary artery disease Mother     CABG in 82's  . Brain cancer Mother     that was metastaticfrom lung   . Stomach cancer Father 26  . Diabetes Sister   . Stroke Maternal Grandmother     Review of Systems:  As stated in the HPI and otherwise negative.   BP 122/60   Pulse (!) 55   Ht 5\' 6"  (1.676 m)   Wt 196 lb 9.6 oz (89.2 kg)   BMI 31.73 kg/m   Physical Examination: General: Well developed, well nourished, NAD  HEENT: OP clear, mucus membranes moist  SKIN: warm, dry. No rashes. Neuro: No focal deficits  Musculoskeletal: Muscle strength 5/5 all ext  Psychiatric: Mood and affect normal  Neck: No JVD, no carotid bruits, no thyromegaly, no lymphadenopathy.  Lungs:Clear bilaterally, no wheezes, rhonci, crackles Cardiovascular: Loletha Grayer, regular rhythm. No murmurs, gallops or rubs. Abdomen:Soft. Bowel sounds present. Non-tender.  Extremities: No  lower extremity edema. Pulses are 2 + in the bilateral DP/PT.  EKG:  EKG is ordered today. The ekg ordered today demonstrates Sinus brady, rate 55 bpm. 1st degree AV block. Poor R wave progression, chronic.   Recent Labs: 05/23/2016: ALT 14; BUN 27; Creatinine, Ser 1.05; Hemoglobin 15.2; Platelets 245.0; Potassium 4.1; Sodium 137   Lipid Panel    Component Value Date/Time   CHOL 142 05/23/2016 0917   TRIG 125.0 05/23/2016 0917   HDL 46.60 05/23/2016 0917   CHOLHDL 3 05/23/2016 0917   VLDL 25.0 05/23/2016 0917   LDLCALC 70 05/23/2016 0917   LDLDIRECT 131.3 04/06/2009 0922     Wt Readings from Last 3 Encounters:  10/09/16 196 lb 9.6 oz (89.2 kg)  05/23/16 189 lb (85.7 kg)  05/22/16 190 lb 9.6 oz (86.5 kg)     Other studies Reviewed: Additional studies/ records that were reviewed today include: . Review of the above records demonstrates:    Assessment and Plan:   1. CAD with unstable angina: He is having more chest pain at minimal exertion. Moderate disease by cath November 2013. He did not tolerate Imdur secondary to headache and Ranexa did not help so he stopped it. No beta blockers secondary to bradycardia.  -Will plan cardiac [facility] 10[facility]12/16. Risks of procedure reviewed with pt today. Continue ASA, statin, ARB.  -Pre-cath labs today  2. HTN: BP controlled at home. Continue ARB.   3. Hyperlipidemia: He is on a statin. Lipids well controlled in primary care  4. Sinus Bradycardia: No dizziness. Will avoid AV nodal blocking agents.   Current medicines are reviewed at length with the patient today.  The patient does not have concerns regarding medicines.  The following changes have been made:  no change  Labs/ tests ordered today include:   No orders of the defined types were placed in this encounter.   Disposition:   FU with me  3 weeks after cath.   Signed, Lauree Chandler, MD 10/09/2016 12:39 PM    [address],  [place], [place]  [address] Phone: [phone]; Fax: [phone]

## 2016-10-12 NOTE — Interval H&P Note (Signed)
History and Physical Interval Note:  10/12/2016 11:59 AM  Jonathan Griffith  has presented today for cardiac cath with the diagnosis of unstable angina.  The various methods of treatment have been discussed with the patient and family. After consideration of risks, benefits and other options for treatment, the patient has consented to  Procedure(s): Left Heart Cath and Coronary Angiography (N/A) as a surgical intervention .  The patient's history has been reviewed, patient examined, no change in status, stable for surgery.  I have reviewed the patient's chart and labs.  Questions were answered to the patient's satisfaction.    Cath Lab Visit (complete for each Cath Lab visit)  Clinical Evaluation Leading to the Procedure:   ACS: No.  Non-ACS:    Anginal Classification: CCS 3  Anti-ischemic medical therapy: No Therapy  Non-Invasive Test Results: No non-invasive testing performed  Prior CABG: No previous CABG         Lauree Chandler

## 2016-10-13 ENCOUNTER — Encounter (HOSPITAL_COMMUNITY): Payer: Self-pay | Admitting: Cardiovascular Disease

## 2016-10-13 MED FILL — Lidocaine HCl Local Preservative Free (PF) Inj 1%: INTRAMUSCULAR | Qty: 30 | Status: AC

## 2016-10-20 ENCOUNTER — Encounter: Payer: Self-pay | Admitting: Physician Assistant

## 2016-11-07 ENCOUNTER — Ambulatory Visit (INDEPENDENT_AMBULATORY_CARE_PROVIDER_SITE_OTHER): Payer: PPO | Admitting: Physician Assistant

## 2016-11-07 ENCOUNTER — Encounter: Payer: Self-pay | Admitting: Pulmonary Disease

## 2016-11-07 ENCOUNTER — Encounter: Payer: Self-pay | Admitting: Physician Assistant

## 2016-11-07 VITALS — BP 124/66 | HR 56 | Ht 66.0 in | Wt 196.0 lb

## 2016-11-07 DIAGNOSIS — I25119 Atherosclerotic heart disease of native coronary artery with unspecified angina pectoris: Secondary | ICD-10-CM

## 2016-11-07 DIAGNOSIS — R0600 Dyspnea, unspecified: Secondary | ICD-10-CM

## 2016-11-07 DIAGNOSIS — E785 Hyperlipidemia, unspecified: Secondary | ICD-10-CM | POA: Diagnosis not present

## 2016-11-07 DIAGNOSIS — R001 Bradycardia, unspecified: Secondary | ICD-10-CM | POA: Insufficient documentation

## 2016-11-07 MED ORDER — RANOLAZINE ER 500 MG PO TB12: 500.0000 mg | ORAL_TABLET | Freq: Two times a day (BID) | ORAL | 3 refills | Status: DC

## 2016-11-07 NOTE — Patient Instructions (Signed)
Medication Instructions:  Start Ranexa (ranolazine) 500 mg every 12 hours   Labwork: None   Testing/Procedures: None   Follow-Up: You have been referred to [facility] for evaluation of shortness of breath (dyspnea).  Your physician recommends that you schedule a follow-up appointment in: 2-3 months with Dr Angelena Form.        If you need a refill on your cardiac medications before your next appointment, please call your pharmacy.

## 2016-11-07 NOTE — Progress Notes (Addendum)
Cardiology Office Note    Date:  11/07/2016   ID:  Jonathan Griffith, DOB [birth year], MRN [id number]  PCP:  Viviana Simpler, MD  Cardiologist: Dr. Angelena Form  Chief Complaint  Patient presents with  . Follow-up    History of Present Illness:  Jonathan Griffith is a 76 y.o. male with history of CAD, HLD, GERD who is here today for cardiac follow up. He is known to have moderate CAD with cath in November 2013 showing moderate disease in the LAD and RCA. He has not tolerated Imdur in the past. He had bradycardia with Bystolic so it was stopped. He was started on Lasix secondary to elevated filling pressures on cath. Trial of Ranexa after visit here in March 2015 but it did not help so he stopped it.   Patient comes in today for post cath follow-up. He still complains of dyspnea on exertion when going up any incline or carrying a bucket from his garden. He also develops chest tightness with activity. It eases when he stops. He says he doesn't remember trying Ranexa and said he would like to try it again as well as see pulmonary. He smoked cigars for 10-12 years but only 1 or 2 a day.Grew up with parents smoking in the home.     Past Medical History:  Diagnosis Date  . Allergic rhinitis   . CAD (coronary artery disease)   . Chest pain   . Duodenal ulcer   . ED (erectile dysfunction)   . GERD (gastroesophageal reflux disease)   . History of colonoscopy   . Hyperlipidemia   . Osteoarthritis     Past Surgical History:  Procedure Laterality Date  . APPENDECTOMY  1964  . CARDIAC CATHETERIZATION  03/2010   diffuse early disease  . CATARACT EXTRACTION Right 04/18/2016  . INGUINAL HERNIA REPAIR Bilateral 1970   double hernia repair   . LEFT HEART CATH AND CORONARY ANGIOGRAPHY N/A 10/12/2016   Procedure: Left Heart Cath and Coronary Angiography;  Surgeon: Burnell Blanks, MD;  Location: [facility];  Service: Cardiovascular;  Laterality: N/A;  . RETINAL DETACHMENT SURGERY Right  2003  . TONSILLECTOMY      Current Medications: Outpatient Medications Prior to Visit  Medication Sig Dispense Refill  . aspirin 81 MG tablet Take 81 mg by mouth daily.      . furosemide (LASIX) 40 MG tablet TAKE 1 TABLET BY MOUTH DAILY 90 tablet 2  . losartan (COZAAR) 50 MG tablet TAKE 1 TABLET BY MOUTH DAILY 90 tablet 2  . nitroGLYCERIN (NITROSTAT) 0.4 MG SL tablet Place 1 tablet (0.4 mg total) under the tongue every 5 (five) minutes as needed for chest pain (x 3 doses). 25 tablet 6  . ranitidine (ZANTAC) 150 MG tablet Take 150 mg by mouth at bedtime.     . simvastatin (ZOCOR) 40 MG tablet TAKE ONE TABLET BY MOUTH EACH EVENING 90 tablet 3   No facility-administered medications prior to visit.      Allergies:   Ibuprofen   Social History   Social History  . Marital status: Married    Spouse name: N/A  . Number of children: 2  . Years of education: N/A   Occupational History  . Administrator, [employer] Service     retired   Social History Main Topics  . Smoking status: Former Smoker    Packs/day: 1.00    Years: 10.00    Types: Cigars    Quit date: 07/18/2007  . Smokeless  tobacco: Never Used     Comment: Occ cigar - no cigarettes - stopped in 6-09  . Alcohol use 0.5 oz/week    1 Standard drinks or equivalent per week  . Drug use: No  . Sexual activity: Not Asked   Other Topics Concern  . None   Social History Narrative   Has living will   Wife would serve as health care POA--then son   Would accept resuscitation attempts but no prolonged artificial ventilation    No tube feeds if cognitively unaware     Family History:  The patient's family history includes Brain cancer in his mother; Coronary artery disease in his mother; Diabetes in his sister; Stomach cancer (age of onset: 72) in his father; Stroke in his maternal grandmother.   ROS:   Please see the history of present illness.    Review of Systems  Constitution: Negative.  HENT: Negative.   Cardiovascular: Positive for chest  pain and dyspnea on exertion.  Respiratory: Negative.   Endocrine: Negative.   Hematologic/Lymphatic: Negative.   Musculoskeletal: Negative.   Gastrointestinal: Negative.   Genitourinary: Negative.   Neurological: Negative.    All other systems reviewed and are negative.   PHYSICAL EXAM:   VS:  BP 124/66   Pulse (!) 56   Ht 5\' 6"  (1.676 m)   Wt 196 lb (88.9 kg)   SpO2 97%   BMI 31.64 kg/m   Physical Exam  GEN: Well nourished, well developed, in no acute distress  Neck: no JVD, carotid bruits, or masses Cardiac:RRR; positive S4, no murmurs, rubs, or gallops  Respiratory:  clear to auscultation bilaterally, normal work of breathing GI: soft, nontender, nondistended, + BS Ext: Right arm at cath site without hematoma or hemorrhage, good radial and brachial pulses, lower extremities without cyanosis, clubbing, or edema, Good distal pulses bilaterally Psych: euthymic mood, full affect  Wt Readings from Last 3 Encounters:  11/07/16 196 lb (88.9 kg)  10/12/16 196 lb (88.9 kg)  10/09/16 196 lb 9.6 oz (89.2 kg)      Studies/Labs Reviewed:   EKG:  EKG is not ordered today.    Recent Labs: 05/23/2016: ALT 14; Hemoglobin 15.2 10/09/2016: BUN 22; Creatinine, Ser 1.02; Platelets 236; Potassium 4.3; Sodium 140   Lipid Panel    Component Value Date/Time   CHOL 142 05/23/2016 0917   TRIG 125.0 05/23/2016 0917   HDL 46.60 05/23/2016 0917   CHOLHDL 3 05/23/2016 0917   VLDL 25.0 05/23/2016 0917   LDLCALC 70 05/23/2016 0917   LDLDIRECT 131.3 04/06/2009 0922    Additional studies/ records that were reviewed today include:  Cardiac Cath 10/12/16  Conclusion     Ost RCA to Mid RCA lesion, 10 %stenosed.  Ost Ramus to Ramus lesion, 20 %stenosed.  Ost LAD to Prox LAD lesion, 40 %stenosed.  Dist LAD lesion, 30 %stenosed.  The left ventricular systolic function is normal.  LV end diastolic pressure is normal.  The left ventricular ejection fraction is greater than 65% by  visual estimate.  There is no mitral valve regurgitation.   1. Stable mild to moderate CAD 2. Normal LV function   Recommendations: Continue medical management of CAD. Consider pulmonary workup for dyspnea.         ASSESSMENT:    1. Dyspnea, unspecified type   2. Coronary artery disease involving native coronary artery of native heart with angina pectoris ([place])   3. Hyperlipidemia, unspecified hyperlipidemia type   4. Sinus bradycardia  PLAN:  In order of problems listed above:  Dyspnea on exertion continues to be a problem for this patient. With nonobstructive disease on recent cardiac catheterization. We'll refer to pulmonary for further workup.  CAD nonobstructive but continues to have chest tightness with exertion. Question of microvascular angina. He would  like to give Ranexa another try. He had headaches with Imdur. We'll begin Ranexa 500 mg twice a day. We've given him samples.  Hyperlipidemia on simvastatin. Have made patient aware of possible muscle cramps will taking Ranexa.  Sinus bradycardia heart rate in the 50s. Has not been a problem for him. Avoid AV nodal blocking agents.    Medication Adjustments/Labs and Tests Ordered: Current medicines are reviewed at length with the patient today.  Concerns regarding medicines are outlined above.  Medication changes, Labs and Tests ordered today are listed in the Patient Instructions below. Patient Instructions  Medication Instructions:  Start Ranexa (ranolazine) 500 mg every 12 hours   Labwork: None   Testing/Procedures: None   Follow-Up: You have been referred to [facility] for evaluation of shortness of breath (dyspnea).  Your physician recommends that you schedule a follow-up appointment in: 2-3 months with Dr Angelena Form.        If you need a refill on your cardiac medications before your next appointment, please call your pharmacy.      Sumner Boast, PA-C  11/07/2016 8:15  AM    [facility] [address], [place], [place]  [address] Phone: [phone]; Fax: [phone]

## 2016-11-13 ENCOUNTER — Ambulatory Visit (INDEPENDENT_AMBULATORY_CARE_PROVIDER_SITE_OTHER): Payer: PPO | Admitting: Internal Medicine

## 2016-11-13 ENCOUNTER — Encounter: Payer: Self-pay | Admitting: Pulmonary Disease

## 2016-11-13 ENCOUNTER — Ambulatory Visit (INDEPENDENT_AMBULATORY_CARE_PROVIDER_SITE_OTHER)
Admission: RE | Admit: 2016-11-13 | Discharge: 2016-11-13 | Disposition: A | Discharge status: Home / Self Care | Payer: PPO | Source: Ambulatory Visit | Attending: Internal Medicine | Admitting: Internal Medicine

## 2016-11-13 VITALS — BP 122/80 | HR 53 | Ht 66.0 in | Wt 193.0 lb

## 2016-11-13 DIAGNOSIS — R0609 Other forms of dyspnea: Secondary | ICD-10-CM | POA: Diagnosis not present

## 2016-11-13 DIAGNOSIS — R0602 Shortness of breath: Secondary | ICD-10-CM | POA: Diagnosis not present

## 2016-11-13 MED ORDER — PANTOPRAZOLE SODIUM 40 MG PO TBEC: 40.0000 mg | DELAYED_RELEASE_TABLET | Freq: Every day | ORAL | 2 refills | Status: DC

## 2016-11-13 NOTE — Progress Notes (Signed)
Spoke with pt and notified of results per Dr. Wert. Pt verbalized understanding and denied any questions. 

## 2016-11-13 NOTE — Progress Notes (Signed)
Subjective:    Patient ID: Jonathan Griffith, male    DOB: [birth year],     MRN: [id number]  HPI  24 yowm  Quit smoking  2009 s resp sequelae with new onset doe x 2013  referred to pulmonary clinic 11/13/2016 by Dr  Reynolds Bowl PA with mild CAD, nl LV function on [facility]  10/12/2016     11/13/2016 1st [facility] office visit/    Chief Complaint  Patient presents with  . PULMONARY CONSULT    Referred by Dr Bonnell Public for increased SOB.   onset sob was indolent/ pattern is minimally progressive x 5 y and reproducible doe x nl pace walking dog on a  hill or 10 min on treadmill 2.9 mph On ranitidine x 2 years with severe gerd at hs  No prior hx of allergies/ asthma and some am sneezing now assoc with sensation of noct wheeze and some wheeze with ex Has not tried inhalers   No obvious day to day or daytime variability or assoc excess/ purulent sputum or mucus plugs or hemoptysis or cp or chest tightness,   or overt sinus or hb symptoms. No unusual exp hx or h/o childhood pna/ asthma or knowledge of premature birth.  Sleeping ok without nocturnal  or early am exacerbation  of respiratory  c/o's or need for noct saba. Also denies any obvious fluctuation of symptoms with weather or environmental changes or other aggravating or alleviating factors except as outlined above   Current Medications, Allergies, Complete Past Medical History, Past Surgical History, Family History, and Social History were reviewed in [employer] record.            Review of Systems  Constitutional: Negative for fever and unexpected weight change.  HENT: Positive for sneezing. Negative for congestion, dental problem, ear pain, nosebleeds, postnasal drip, rhinorrhea, sinus pressure, sore throat and trouble swallowing.   Eyes: Negative for redness and itching.  Respiratory: Positive for cough, shortness of breath and wheezing. Negative for chest tightness.   Cardiovascular: Negative for  palpitations and leg swelling.  Gastrointestinal: Negative for nausea and vomiting.  Genitourinary: Negative for dysuria.  Musculoskeletal: Positive for myalgias ( muscle cramps). Negative for joint swelling.  Skin: Negative for rash.  Neurological: Negative for headaches.  Hematological: Does not bruise/bleed easily.  Psychiatric/Behavioral: Negative for dysphoric mood. The patient is not nervous/anxious.        Objective:   Physical Exam   amb wm nad   Wt Readings from Last 3 Encounters:  11/13/16 193 lb (87.5 kg)  11/07/16 196 lb (88.9 kg)  10/12/16 196 lb (88.9 kg)    Vital signs reviewed  - Note on arrival 02 sats  97% on RA     HEENT: nl   turbinates bilaterally, and oropharynx. Nl external ear canals without cough reflex - edentulous    NECK :  without JVD/Nodes/TM/ nl carotid upstrokes bilaterally   LUNGS: no acc muscle use,  Nl contour chest which is clear to A and P bilaterally without cough on insp or exp maneuvers   CV:  RRR  no s3 or murmur or increase in P2, and no edema   ABD:  soft and nontender with nl inspiratory excursion in the supine position. No bruits or organomegaly appreciated, bowel sounds nl  MS:  Nl gait/ ext warm without deformities, calf tenderness, cyanosis or clubbing No obvious joint restrictions   SKIN: warm and dry without lesions    NEURO:  alert, approp, nl  sensorium with  no motor or cerebellar deficits apparent.     CXR PA and Lateral:   11/13/2016 :    I personally reviewed images and agree with radiology impression as follows:   Lingular scarring.  No active disease.      Assessment & Plan:

## 2016-11-13 NOTE — Patient Instructions (Addendum)
Pantoprazole (protonix) 40 mg   Take  30-60 min before first meal of the day and ranitidine one after supper and  one @  bedtime until return to office - this is the best way to tell whether stomach acid is contributing to your problem.     GERD (REFLUX)  is an extremely common cause of respiratory symptoms just like yours , many times with no obvious heartburn at all.    It can be treated with medication, but also with lifestyle changes including elevation of the head of your bed (ideally with 6 inch  bed blocks),  Smoking cessation, avoidance of late meals, excessive alcohol, and avoid fatty foods, chocolate, peppermint, colas, red wine, and acidic juices such as orange juice.  NO MINT OR MENTHOL PRODUCTS SO NO COUGH DROPS   USE SUGARLESS CANDY INSTEAD (Jolley ranchers or Stover's or Life Savers) or even ice chips will also do - the key is to swallow to prevent all throat clearing. NO OIL BASED VITAMINS - use powdered substitutes.    Please remember to go to the x-ray department downstairs in the basement  for your tests - we will call you with the results when they are available.     Please schedule a follow up office visit in 6 weeks, call sooner if needed with full pfts on return

## 2016-11-15 NOTE — Assessment & Plan Note (Signed)
Spirometry 11/13/2016  FEV1 2.29 (92%)  Ratio 73 with min curvature on no rx   - 11/13/2016  Walked RA x 3 laps @ 185 ft each stopped due to  End of study, nl pace, no sob or desat   Despite smoking hx he has no significant copd so  Symptoms are   disproportionate to objective findings and not clear this is a lung problem but pt does appear to have difficult airway management issues.  DDX of  difficult airways management almost all start with A and  include Adherence, Ace Inhibitors, Acid Reflux, Active Sinus Disease, Alpha 1 Antitripsin deficiency, Anxiety masquerading as Airways dz,  ABPA,  Allergy(esp in young), Aspiration (esp in elderly), Adverse effects of meds,  Active smokers, A bunch of PE's (a small clot burden can't cause this syndrome unless there is already severe underlying pulm or vascular dz with poor reserve) plus two Bs  = Bronchiectasis and Beta blocker use..and one C= CHF   Adherence is always the initial "prime suspect" and is a multilayered concern that requires a "trust but verify" approach in every patient - starting with knowing how to use medications, especially inhalers, correctly, keeping up with refills and understanding the fundamental difference between maintenance and prns vs those medications only taken for a very short course and then stopped and not refilled.   ? Acid (or non-acid) GERD > always difficult to exclude as up to 75% of pts in some series report no assoc GI/ Heartburn symptoms and the noct wheeze just may be due to LPR > rec max (24h)  acid suppression and diet restrictions/ reviewed and instructions given in writing.     ? Allergy/ asthma > does not fit this pattern but may pursue on return   ? Active sinus dz > also in ddx but not apparent clinically/ hold on sinus ct for now   ? Anxiety related to wt gain/ deconditioning > dx of exclusion  ? CHF > not evidence by cards eval    Will first try rx for gerd then return for full pfts   Total time  devoted to counseling  > 50 % of initial 60 min office visit:  review case with pt/ discussion of options/alternatives/ personally creating written customized instructions  in presence of pt  then going over those specific  Instructions directly with the pt including how to use all of the meds but in particular covering each new medication in detail and the difference between the maintenance= "automatic" meds and the prns using an action plan format for the latter (If this problem/symptom => do that organization reading Left to right).  Please see AVS from this visit for a full list of these instructions which I personally wrote for this pt and  are unique to this visit.

## 2016-11-16 ENCOUNTER — Other Ambulatory Visit: Payer: Self-pay | Admitting: Cardiovascular Disease

## 2017-01-07 NOTE — Progress Notes (Signed)
Chief Complaint  Patient presents with  . Follow-up    2-3  MONTH F/U  . Claudication   History of Present Illness: 76 yo white male with history of CAD, HLD, GERD who is here today for cardiac follow up. He is known to have mild to moderate CAD with last cath in March 2018 showing 40% ostial LAD stenosis and mild disease in the RCA and intermediate Balling. LV funciton was normal. He has not tolerated Imdur in the past. He had bradycardia with Bystolic so it was stopped. He has been seen in the Pulmonary clinic for workup of dyspnea, not felt to have significant lung disease.   He is here today for follow up. The patient denies any change in chest pain. He has occasional pressure at moderate exertiona. No dyspnea, palpitations, lower extremity edema, orthopnea, PND, dizziness, near syncope or syncope. He has some leg cramping at night. No pain in legs when walking.     Primary Care Physician: Venia Carbon, MD  Past Medical History:  Diagnosis Date  . Allergic rhinitis   . CAD (coronary artery disease)   . Chest pain   . Duodenal ulcer   . ED (erectile dysfunction)   . GERD (gastroesophageal reflux disease)   . History of colonoscopy   . Hyperlipidemia   . Osteoarthritis     Past Surgical History:  Procedure Laterality Date  . APPENDECTOMY  1964  . CARDIAC CATHETERIZATION  03/2010   diffuse early disease  . CATARACT EXTRACTION Right 04/18/2016  . INGUINAL HERNIA REPAIR Bilateral 1970   double hernia repair   . LEFT HEART CATH AND CORONARY ANGIOGRAPHY N/A 10/12/2016   Procedure: Left Heart Cath and Coronary Angiography;  Surgeon: Burnell Blanks, MD;  Location: [facility];  Service: Cardiovascular;  Laterality: N/A;  . RETINAL DETACHMENT SURGERY Right 2003  . TONSILLECTOMY      Current Outpatient Prescriptions  Medication Sig Dispense Refill  . aspirin 81 MG tablet Take 81 mg by mouth daily.      . furosemide (LASIX) 40 MG tablet TAKE 1 TABLET BY  MOUTH DAILY 90 tablet 2  . losartan (COZAAR) 50 MG tablet TAKE 1 TABLET BY MOUTH DAILY 90 tablet 3  . nitroGLYCERIN (NITROSTAT) 0.4 MG SL tablet Place 1 tablet (0.4 mg total) under the tongue every 5 (five) minutes as needed for chest pain (x 3 doses). 25 tablet 6  . pantoprazole (PROTONIX) 40 MG tablet Take 1 tablet (40 mg total) by mouth daily. Take 30-60 min before first meal of the day 30 tablet 2  . ranitidine (ZANTAC) 150 MG tablet Take 150 mg by mouth at bedtime.     . ranolazine (RANEXA) 500 MG 12 hr tablet Take 1 tablet (500 mg total) by mouth 2 (two) times daily. 60 tablet 3  . simvastatin (ZOCOR) 40 MG tablet TAKE ONE TABLET BY MOUTH EACH EVENING 90 tablet 3   No current facility-administered medications for this visit.     Allergies  Allergen Reactions  . Ibuprofen     REACTION: ulcer    Social History   Social History  . Marital status: Married    Spouse name: N/A  . Number of children: 2  . Years of education: N/A   Occupational History  . Administrator, [employer] Service     retired   Social History Main Topics  . Smoking status: Former Smoker    Packs/day: 1.00    Years: 10.00  Types: Cigars    Quit date: 07/18/2007  . Smokeless tobacco: Never Used     Comment: Occ cigar - no cigarettes - stopped in 6-09  . Alcohol use 0.5 oz/week    1 Standard drinks or equivalent per week     Comment: 3-4 drinks per week  . Drug use: No  . Sexual activity: Not on file   Other Topics Concern  . Not on file   Social History Narrative   Has living will   Wife would serve as health care POA--then son   Would accept resuscitation attempts but no prolonged artificial ventilation    No tube feeds if cognitively unaware    Family History  Problem Relation Age of Onset  . Coronary artery disease Mother        CABG in 39's  . Brain cancer Mother        that was metastaticfrom lung   . Stomach cancer Father 34  . Diabetes Sister   . Stroke Maternal Grandmother     Review of  Systems:  As stated in the HPI and otherwise negative.   BP 110/62   Pulse (!) 56   Ht 5\' 6"  (1.676 m)   Wt 189 lb (85.7 kg)   BMI 30.51 kg/m   Physical Examination: General: Well developed, well nourished, NAD  HEENT: OP clear, mucus membranes moist  SKIN: warm, dry. No rashes. Neuro: No focal deficits  Musculoskeletal: Muscle strength 5/5 all ext  Psychiatric: Mood and affect normal  Neck: No JVD, no carotid bruits, no thyromegaly, no lymphadenopathy.  Lungs:Clear bilaterally, no wheezes, rhonci, crackles Cardiovascular: Regular rate and rhythm. No murmurs, gallops or rubs. Abdomen:Soft. Bowel sounds present. Non-tender.  Extremities: No lower extremity edema. Pulses are 2 + in the bilateral DP/PT.   EKG:  EKG is not ordered today. The ekg ordered today demonstrates   Recent Labs: 05/23/2016: ALT 14 10/09/2016: BUN 22; Creatinine, Ser 1.02; Hemoglobin 15.2; Platelets 236; Potassium 4.3; Sodium 140   Lipid Panel    Component Value Date/Time   CHOL 142 05/23/2016 0917   TRIG 125.0 05/23/2016 0917   HDL 46.60 05/23/2016 0917   CHOLHDL 3 05/23/2016 0917   VLDL 25.0 05/23/2016 0917   LDLCALC 70 05/23/2016 0917   LDLDIRECT 131.3 04/06/2009 0922     Wt Readings from Last 3 Encounters:  01/08/17 189 lb (85.7 kg)  11/13/16 193 lb (87.5 kg)  11/07/16 196 lb (88.9 kg)     Other studies Reviewed: Additional studies/ records that were reviewed today include: . Review of the above records demonstrates:    Assessment and Plan:   1. CAD with stable angina: He has chronic chest pain which is stable. Cath in March 2018 with stable mild CAD. He is on ASA, statin and Ranexa. He does not tolerate beta blockers due to bradycardia.   2. HTN: BP controlled. No changes.   3. Hyperlipidemia: Continue statin. Lipids followed in primary care  4. Sinus Bradycardia: Asymptomatic.   5. Leg cramping: He has no pain when walking. Pedal pulses are normal on exam. Mostly nighttime  cramping.   Current medicines are reviewed at length with the patient today.  The patient does not have concerns regarding medicines.  The following changes have been made:  no change  Labs/ tests ordered today include:   No orders of the defined types were placed in this encounter.   Disposition:   FU with me in 12 months.    Signed, Harrell Gave  Angelena Form, MD 01/08/2017 8:48 AM    [facility] [address], [place], [place]  [address] Phone: [phone]; Fax: [phone]

## 2017-01-08 ENCOUNTER — Encounter: Payer: Self-pay | Admitting: Cardiovascular Disease

## 2017-01-08 ENCOUNTER — Encounter (INDEPENDENT_AMBULATORY_CARE_PROVIDER_SITE_OTHER): Payer: Self-pay

## 2017-01-08 ENCOUNTER — Ambulatory Visit (INDEPENDENT_AMBULATORY_CARE_PROVIDER_SITE_OTHER): Payer: PPO | Admitting: Cardiovascular Disease

## 2017-01-08 VITALS — BP 110/62 | HR 56 | Ht 66.0 in | Wt 189.0 lb

## 2017-01-08 DIAGNOSIS — E7800 Pure hypercholesterolemia, unspecified: Secondary | ICD-10-CM

## 2017-01-08 DIAGNOSIS — I10 Essential (primary) hypertension: Secondary | ICD-10-CM | POA: Diagnosis not present

## 2017-01-08 DIAGNOSIS — R001 Bradycardia, unspecified: Secondary | ICD-10-CM

## 2017-01-08 DIAGNOSIS — I25119 Atherosclerotic heart disease of native coronary artery with unspecified angina pectoris: Secondary | ICD-10-CM | POA: Diagnosis not present

## 2017-01-08 NOTE — Patient Instructions (Signed)

## 2017-01-15 ENCOUNTER — Ambulatory Visit (INDEPENDENT_AMBULATORY_CARE_PROVIDER_SITE_OTHER): Payer: PPO | Admitting: Internal Medicine

## 2017-01-15 ENCOUNTER — Encounter: Payer: Self-pay | Admitting: Internal Medicine

## 2017-01-15 VITALS — BP 116/70 | HR 48 | Ht 66.5 in | Wt 189.0 lb

## 2017-01-15 DIAGNOSIS — R0609 Other forms of dyspnea: Secondary | ICD-10-CM

## 2017-01-15 LAB — PULMONARY FUNCTION TEST
DL/VA % pred: 87 %
DL/VA: 3.8 ml/min/mmHg/L
DLCO cor % pred: 83 %
DLCO cor: 23.13 ml/min/mmHg
DLCO unc % pred: 83 %
DLCO unc: 23.06 ml/min/mmHg
FEF 25-75 Post: 3 L/sec
FEF 25-75 Pre: 1.78 L/sec
FEF2575-%CHANGE-POST: 68 %
FEF2575-%PRED-PRE: 100 %
FEF2575-%Pred-Post: 168 %
FEV1-%Change-Post: 10 %
FEV1-%Pred-Post: 96 %
FEV1-%Pred-Pre: 87 %
FEV1-POST: 2.46 L
FEV1-Pre: 2.23 L
FEV1FVC-%CHANGE-POST: -2 %
FEV1FVC-%Pred-Pre: 106 %
FEV6-%Change-Post: 13 %
FEV6-%Pred-Post: 98 %
FEV6-%Pred-Pre: 86 %
FEV6-Post: 3.26 L
FEV6-Pre: 2.87 L
FEV6FVC-%Change-Post: 0 %
FEV6FVC-%PRED-POST: 107 %
FEV6FVC-%Pred-Pre: 107 %
FVC-%Change-Post: 12 %
FVC-%PRED-POST: 91 %
FVC-%PRED-PRE: 81 %
FVC-POST: 3.27 L
FVC-PRE: 2.9 L
POST FEV1/FVC RATIO: 75 %
PRE FEV1/FVC RATIO: 77 %
Post FEV6/FVC ratio: 100 %
Pre FEV6/FVC Ratio: 100 %
RV % pred: 143 %
RV: 3.46 L
TLC % PRED: 107 %
TLC: 6.8 L

## 2017-01-15 NOTE — Assessment & Plan Note (Signed)
Spirometry 11/13/2016  FEV1 2.29 (92%)  Ratio 73 with min curvature on no rx   - 11/13/2016  Walked RA x 3 laps @ 185 ft each stopped due to  End of study, nl pace, no sob or desat    - PFT's  01/15/2017  FEV1 2.46 (96 % ) ratio 75  p 10 % improvement from saba p nothing prior to study with DLCO  83 % corrects to 87 % for alv volume  - improved 01/15/2017 on gerd RX > rec complete 3 m of rx > Follow up per Primary Care planned      Clearly improved with empirical rx for gerd on no resp rx    I had an extended final summary discussion with the patient reviewing all relevant studies completed to date and  lasting 15 to 20 minutes of a 25 minute visit on the following issues:     When respiratory symptoms begin or become refractory well after a patient reports complete smoking cessation,  Especially when this wasn't the case while they were smoking, a red flag is raised based on the work of Dr Kris Mouton which states:  if you quit smoking when your best day FEV1 is still well preserved(as is clearly the case here)  it is highly unlikely you will progress to severe disease.  That is to say, once the smoking stops,  the symptoms should not suddenly erupt or markedly worsen.  If so, the differential diagnosis should include  obesity/deconditioning,  LPR/Reflux/Aspiration syndromes,  occult CHF, or  especially side effect of medications commonly used in this population.       Also reviewed:  I reviewed the Fletcher curve with the patient that basically indicates  if you quit smoking when your best day FEV1 is still well preserved (as is clearly  the case here)  it is highly unlikely you will progress to severe disease and informed the patient there was  no medication on the market that has proven to alter the curve/ its downward trajectory  or the likelihood of progression of their disease(unlike other chronic medical conditions such as atheroclerosis where we do think we can change the natural hx with risk  reducing meds)    Therefore stopping smoking and maintaining abstinence is the most important aspect of care, not choice of inhalers or for that matter, doctors.    Pulmonary f/u is prn, defer whether to refer to GI to PCP (Dr Silvio Pate)    Each maintenance medication was reviewed in detail including most importantly the difference between maintenance and as needed and under what circumstances the prns are to be used.  Please see AVS for specific  Instructions which are unique to this visit and I personally typed out  which were reviewed in detail in writing with the patient and a copy provided.

## 2017-01-15 NOTE — Patient Instructions (Addendum)
Complete your acid suppression medications and see Dr Lubertha Sayres for follow up to decide whether to continue them longterm, try cheaper alternatives, or be referred to the GI doctors for follow up          If in any way you are not 100% satisfied,  please tell us.  If 100% better, tell your friends!  Pulmonary follow up is as needed

## 2017-01-15 NOTE — Progress Notes (Signed)
Subjective:   Patient ID: Jonathan Griffith, male    DOB: [birth year]     MRN: [id number]   Brief patient profile:  28 yowm  Quit smoking  2009 s resp sequelae with new onset doe x 2013  referred to pulmonary clinic 11/13/2016 by Dr  Reynolds Bowl PA with mild CAD, nl LV function on [facility]  10/12/2016     History of Present Illness  11/13/2016 1st [facility] office visit/    Chief Complaint  Patient presents with  . PULMONARY CONSULT    Referred by Dr Bonnell Public for increased SOB.   onset sob was indolent/ pattern is minimally progressive x 5 y and reproducible doe x nl pace walking dog on a  hill or 10 min on treadmill 2.9 mph On ranitidine x 2 years with severe gerd at hs  No prior hx of allergies/ asthma and some am sneezing now assoc with sensation of noct wheeze and some wheeze with ex Has not tried inhalers  rec Pantoprazole (protonix) 40 mg   Take  30-60 min before first meal of the day and ranitidine one after supper and  one @  bedtime until return to office - this is the best way to tell whether stomach acid is contributing to your problem.   GERD diet   01/15/2017  f/u ov/ re: doe ? Etiology  Chief Complaint  Patient presents with  . Follow-up    PFT's done. Breathing has improved some. No new co's today.    has not resumed treadmill but walking fine now including hills   No obvious day to day or daytime variability or assoc excess/ purulent sputum or mucus plugs or hemoptysis or cp or chest tightness, subjective wheeze or overt sinus or hb symptoms. No unusual exp hx or h/o childhood pna/ asthma or knowledge of premature birth.  Sleeping ok without nocturnal  or early am exacerbation  of respiratory  c/o's or need for noct saba. Also denies any obvious fluctuation of symptoms with weather or environmental changes or other aggravating or alleviating factors except as outlined above   Current Medications, Allergies, Complete Past Medical History, Past Surgical History,  Family History, and Social History were reviewed in [employer] record.  ROS  The following are not active complaints unless bolded sore throat, dysphagia, dental problems, itching, sneezing,  nasal congestion or excess/ purulent secretions, ear ache,   fever, chills, sweats, unintended wt loss, classically pleuritic or exertional cp,  orthopnea pnd or leg swelling, presyncope, palpitations, abdominal pain, anorexia, nausea, vomiting, diarrhea  or change in bowel or bladder habits, change in stools or urine, dysuria,hematuria,  rash, arthralgias, visual complaints, headache, numbness, weakness or ataxia or problems with walking or coordination,  change in mood/affect or memory.                 Objective:   Physical Exam   amb wm nad    01/15/2017         189   11/13/16 193 lb (87.5 kg)  11/07/16 196 lb (88.9 kg)  10/12/16 196 lb (88.9 kg)    Vital signs reviewed  - Note on arrival 02 sats  95% on RA    HEENT: nl   turbinates bilaterally, and oropharynx. Nl external ear canals without cough reflex - edentulous    NECK :  without JVD/Nodes/TM/ nl carotid upstrokes bilaterally   LUNGS: no acc muscle use,  Nl contour chest which is clear to A and P  bilaterally without cough on insp or exp maneuvers   CV:  RRR  no s3 or murmur or increase in P2, and no edema   ABD:  soft and nontender with nl inspiratory excursion in the supine position. No bruits or organomegaly appreciated, bowel sounds nl  MS:  Nl gait/ ext warm without deformities, calf tenderness, cyanosis or clubbing No obvious joint restrictions   SKIN: warm and dry without lesions    NEURO:  alert, approp, nl sensorium with  no motor or cerebellar deficits apparent.     CXR PA and Lateral:   11/13/2016 :    I personally reviewed images and agree with radiology impression as follows:   Lingular scarring.  No active disease.      Assessment & Plan:   Outpatient Encounter Prescriptions as of  01/15/2017  Medication Sig  . aspirin 81 MG tablet Take 81 mg by mouth daily.    . furosemide (LASIX) 40 MG tablet TAKE 1 TABLET BY MOUTH DAILY  . losartan (COZAAR) 50 MG tablet TAKE 1 TABLET BY MOUTH DAILY  . nitroGLYCERIN (NITROSTAT) 0.4 MG SL tablet Place 1 tablet (0.4 mg total) under the tongue every 5 (five) minutes as needed for chest pain (x 3 doses).  . pantoprazole (PROTONIX) 40 MG tablet Take 1 tablet (40 mg total) by mouth daily. Take 30-60 min before first meal of the day  . ranitidine (ZANTAC) 150 MG tablet Take 150 mg by mouth at bedtime.   . ranolazine (RANEXA) 500 MG 12 hr tablet Take 1 tablet (500 mg total) by mouth 2 (two) times daily.  . simvastatin (ZOCOR) 40 MG tablet TAKE ONE TABLET BY MOUTH EACH EVENING   No facility-administered encounter medications on file as of 01/15/2017.

## 2017-01-15 NOTE — Progress Notes (Signed)
PFT done today. 

## 2017-02-23 DIAGNOSIS — L821 Other seborrheic keratosis: Secondary | ICD-10-CM | POA: Diagnosis not present

## 2017-02-23 DIAGNOSIS — Z85828 Personal history of other malignant neoplasm of skin: Secondary | ICD-10-CM | POA: Diagnosis not present

## 2017-02-28 NOTE — Research (Signed)
[employer] Research Study Informed Consent   Subject Name: Jonathan Griffith  Subject met inclusion and exclusion criteria.  The informed consent form, study requirements and expectations were reviewed with the subject and questions and concerns were addressed prior to the signing of the consent form.  The subject verbalized understanding of the trail requirements.  The subject agreed to participate in the trial and signed the informed consent at 1310 on 10/12/16.  The informed consent was obtained prior to performance of any protocol-specific procedures for the subject.  A copy of the signed informed consent was given to the subject and a copy was placed in the subject's medical record. The subject will only be enrolled if angiographic criteria met.  [name] 02/28/2017, 1:41 PM

## 2017-03-08 ENCOUNTER — Ambulatory Visit (INDEPENDENT_AMBULATORY_CARE_PROVIDER_SITE_OTHER): Payer: PPO | Admitting: Ophthalmology

## 2017-03-08 DIAGNOSIS — H338 Other retinal detachments: Secondary | ICD-10-CM

## 2017-03-08 DIAGNOSIS — H43813 Vitreous degeneration, bilateral: Secondary | ICD-10-CM

## 2017-03-08 DIAGNOSIS — H35371 Puckering of macula, right eye: Secondary | ICD-10-CM

## 2017-03-08 DIAGNOSIS — H35033 Hypertensive retinopathy, bilateral: Secondary | ICD-10-CM

## 2017-03-08 DIAGNOSIS — I10 Essential (primary) hypertension: Secondary | ICD-10-CM

## 2017-03-08 DIAGNOSIS — H33103 Unspecified retinoschisis, bilateral: Secondary | ICD-10-CM

## 2017-05-11 ENCOUNTER — Other Ambulatory Visit: Payer: Self-pay | Admitting: Cardiology

## 2017-05-30 ENCOUNTER — Other Ambulatory Visit: Payer: Self-pay | Admitting: Internal Medicine

## 2017-08-22 ENCOUNTER — Ambulatory Visit (INDEPENDENT_AMBULATORY_CARE_PROVIDER_SITE_OTHER): Payer: PPO | Admitting: Internal Medicine

## 2017-08-22 ENCOUNTER — Encounter: Payer: Self-pay | Admitting: Internal Medicine

## 2017-08-22 VITALS — BP 120/68 | HR 49 | Temp 97.5°F | Ht 66.0 in | Wt 191.0 lb

## 2017-08-22 DIAGNOSIS — Z1211 Encounter for screening for malignant neoplasm of colon: Secondary | ICD-10-CM | POA: Diagnosis not present

## 2017-08-22 DIAGNOSIS — I25119 Atherosclerotic heart disease of native coronary artery with unspecified angina pectoris: Secondary | ICD-10-CM

## 2017-08-22 DIAGNOSIS — Z0000 Encounter for general adult medical examination without abnormal findings: Secondary | ICD-10-CM

## 2017-08-22 DIAGNOSIS — Z7189 Other specified counseling: Secondary | ICD-10-CM

## 2017-08-22 DIAGNOSIS — M159 Polyosteoarthritis, unspecified: Secondary | ICD-10-CM

## 2017-08-22 DIAGNOSIS — K219 Gastro-esophageal reflux disease without esophagitis: Secondary | ICD-10-CM

## 2017-08-22 DIAGNOSIS — Z23 Encounter for immunization: Secondary | ICD-10-CM | POA: Diagnosis not present

## 2017-08-22 DIAGNOSIS — M150 Primary generalized (osteo)arthritis: Secondary | ICD-10-CM | POA: Diagnosis not present

## 2017-08-22 LAB — COMPREHENSIVE METABOLIC PANEL
ALBUMIN: 4.3 g/dL (ref 3.5–5.2)
ALT: 12 U/L (ref 0–53)
AST: 18 U/L (ref 0–37)
Alkaline Phosphatase: 89 U/L (ref 39–117)
BUN: 21 mg/dL (ref 6–23)
CO2: 32 meq/L (ref 19–32)
CREATININE: 1.07 mg/dL (ref 0.40–1.50)
Calcium: 9.4 mg/dL (ref 8.4–10.5)
Chloride: 101 mEq/L (ref 96–112)
GFR: 71.04 mL/min (ref >60.00)
Glucose, Bld: 100 mg/dL — ABNORMAL HIGH (ref 70–99)
Potassium: 4.1 mEq/L (ref 3.5–5.1)
SODIUM: 138 meq/L (ref 135–145)
Total Bilirubin: 0.9 mg/dL (ref 0.2–1.2)
Total Protein: 7.2 g/dL (ref 6.0–8.3)

## 2017-08-22 LAB — CBC
HCT: 45 % (ref 39.0–52.0)
Hemoglobin: 15.4 g/dL (ref 13.0–17.0)
MCHC: 34.3 g/dL (ref 30.0–36.0)
MCV: 86.4 fl (ref 78.0–100.0)
Platelets: 241 10*3/uL (ref 150.0–400.0)
RBC: 5.21 Mil/uL (ref 4.22–5.81)
RDW: 13.9 % (ref 11.5–15.5)
WBC: 7.2 10*3/uL (ref 4.0–10.5)

## 2017-08-22 LAB — LIPID PANEL
CHOL/HDL RATIO: 3
CHOLESTEROL: 127 mg/dL (ref 0–200)
HDL: 41.2 mg/dL (ref >39.00)
LDL Cholesterol: 69 mg/dL (ref 0–99)
NonHDL: 85.6
Triglycerides: 84 mg/dL (ref 0.0–149.0)
VLDL: 16.8 mg/dL (ref 0.0–40.0)

## 2017-08-22 NOTE — Addendum Note (Signed)
Addended by: Pilar Grammes on: 08/22/2017 10:31 AM   Modules accepted: Orders

## 2017-08-22 NOTE — Progress Notes (Signed)
Subjective:    Patient ID: Jonathan Griffith, male    DOB: [birth year], 77 y.o.   MRN: [id number]  HPI Here for Medicare wellness visit and follow up of chronic health conditions Reviewed form and advanced directives Reviewed other doctors Continues to exercise regularly No tobacco Still enjoys 1-2 bourbon daily No falls No depression or anhedonia Vision is okay. Hearing is okay Independent with instrumental ADLs No apparent memory issues  Has noted some urinary problems Mostly just some dribbling after Flow is slow at times Nocturia only 0-1  Some arthritis pain -mostly in hands Will have some balance issues just getting up from sitting---legs Doesn't use any meds  Hospitalization 3/18 for chest pain Cardiac cath was benign Then saw pulmonology -- function tests were fine Still gets the stable SOB walking dog up the hill No chest pain No dizziness or syncope No edema--not taking lasix more that 1-2 times per week  No recent GERD symptoms May be better since not sleeping with dentures No dysphagia Takes PPI daily  Current Outpatient Medications on File Prior to Visit  Medication Sig Dispense Refill  . aspirin 81 MG tablet Take 81 mg by mouth daily.      . furosemide (LASIX) 40 MG tablet Take 1 tablet (40 mg total) by mouth daily. 90 tablet 1  . losartan (COZAAR) 50 MG tablet TAKE 1 TABLET BY MOUTH DAILY 90 tablet 3  . nitroGLYCERIN (NITROSTAT) 0.4 MG SL tablet Place 1 tablet (0.4 mg total) under the tongue every 5 (five) minutes as needed for chest pain (x 3 doses). 25 tablet 6  . pantoprazole (PROTONIX) 40 MG tablet Take 1 tablet (40 mg total) by mouth daily. Take 30-60 min before first meal of the day 30 tablet 2  . ranitidine (ZANTAC) 150 MG tablet Take 150 mg by mouth at bedtime.     . ranolazine (RANEXA) 500 MG 12 hr tablet Take 1 tablet (500 mg total) by mouth 2 (two) times daily. 60 tablet 3  . simvastatin (ZOCOR) 40 MG tablet TAKE ONE TABLET BY MOUTH EACH  EVENING 90 tablet 0   No current facility-administered medications on file prior to visit.     Allergies  Allergen Reactions  . Ibuprofen     REACTION: ulcer    Past Medical History:  Diagnosis Date  . Allergic rhinitis   . CAD (coronary artery disease)   . Chest pain   . Duodenal ulcer   . ED (erectile dysfunction)   . GERD (gastroesophageal reflux disease)   . History of colonoscopy   . Hyperlipidemia   . Osteoarthritis     Past Surgical History:  Procedure Laterality Date  . APPENDECTOMY  1964  . CARDIAC CATHETERIZATION  03/2010   diffuse early disease  . CATARACT EXTRACTION Right 04/18/2016  . INGUINAL HERNIA REPAIR Bilateral 1970   double hernia repair   . LEFT HEART CATH AND CORONARY ANGIOGRAPHY N/A 10/12/2016   Procedure: Left Heart Cath and Coronary Angiography;  Surgeon: Burnell Blanks, MD;  Location: [facility];  Service: Cardiovascular;  Laterality: N/A;  . RETINAL DETACHMENT SURGERY Right 2003  . TONSILLECTOMY      Family History  Problem Relation Age of Onset  . Coronary artery disease Mother        CABG in 45's  . Brain cancer Mother        that was metastaticfrom lung   . Stomach cancer Father 66  . Diabetes Sister   . Stroke  Maternal Grandmother     Social History   Socioeconomic History  . Marital status: Married    Spouse name: Not on file  . Number of children: 2  . Years of education: Not on file  . Highest education level: Not on file  Social Needs  . Financial resource strain: Not on file  . Food insecurity - worry: Not on file  . Food insecurity - inability: Not on file  . Transportation needs - medical: Not on file  . Transportation needs - non-medical: Not on file  Occupational History  . Occupation: Administrator, Civil Service    Comment: retired  Tobacco Use  . Smoking status: Former Smoker    Packs/day: 1.00    Years: 10.00    Pack years: 10.00    Types: Cigars    Last attempt to quit: 07/18/2007    Years since  quitting: 10.1  . Smokeless tobacco: Never Used  . Tobacco comment: Occ cigar - no cigarettes - stopped in 6-09  Substance and Sexual Activity  . Alcohol use: Yes    Alcohol/week: 0.5 oz    Types: 1 Standard drinks or equivalent per week    Comment: 3-4 drinks per week  . Drug use: No  . Sexual activity: Not on file  Other Topics Concern  . Not on file  Social History Narrative   Has living will   Wife would serve as health care POA--then son   Would accept resuscitation attempts but no prolonged artificial ventilation    No tube feeds if cognitively unaware   Review of Systems  Appetite is fine Weight stable Sleeps okay---variable (some restless nights) Wears seat belt Wears dentures Bowels are fine. No blood No rash or suspicious skin lesions. Some cracking in hands No back pain    Objective:   Physical Exam  Constitutional: He is oriented to person, place, and time. He appears well-developed. No distress.  HENT:  Mouth/Throat: Oropharynx is clear and moist. No oropharyngeal exudate.  Neck: No thyromegaly present.  Cardiovascular: Normal rate, regular rhythm, normal heart sounds and intact distal pulses. Exam reveals no gallop.  No murmur heard. Pulmonary/Chest: Effort normal and breath sounds normal. No respiratory distress. He has no wheezes. He has no rales.  Abdominal: Soft. He exhibits no distension. There is no tenderness. There is no rebound and no guarding.  Musculoskeletal: He exhibits no edema or tenderness.  Lymphadenopathy:    He has no cervical adenopathy.  Neurological: He is alert and oriented to person, place, and time.  President--- "Daisy Floro, Obama, Bush" [phone] [id number]  Not good at spelling Recall 3/3  Skin: No rash noted. No erythema.  Psychiatric: He has a normal mood and affect. His behavior is normal.          Assessment & Plan:

## 2017-08-22 NOTE — Assessment & Plan Note (Signed)
I have personally reviewed the Medicare Annual Wellness questionnaire and have noted 1. The patient's medical and social history 2. Their use of alcohol, tobacco or illicit drugs 3. Their current medications and supplements 4. The patient's functional ability including ADL's, fall risks, home safety risks and hearing or visual             impairment. 5. Diet and physical activities 6. Evidence for depression or mood disorders  The patients weight, height, BMI and visual acuity have been recorded in the chart I have made referrals, counseling and provided education to the patient based review of the above and I have provided the pt with a written personalized care plan for preventive services.  I have provided you with a copy of your personalized plan for preventive services. Please take the time to review along with your updated medication list.  Will update pneumovax Yearly flu vaccine No PSA due to age FIT till 47 Exercises regularly

## 2017-08-22 NOTE — Assessment & Plan Note (Signed)
Chronic stable DOE Doing well on the ranolazine

## 2017-08-22 NOTE — Assessment & Plan Note (Signed)
Controlled with PPI

## 2017-08-22 NOTE — Assessment & Plan Note (Signed)
Mostly hands Does okay without meds

## 2017-08-22 NOTE — Assessment & Plan Note (Signed)
See social history 

## 2017-08-22 NOTE — Progress Notes (Signed)
Hearing Screening   Method: Audiometry   125Hz  250Hz  500Hz  1000Hz  2000Hz  3000Hz  4000Hz  6000Hz  8000Hz   Right ear:   20 20 20   40    Left ear:   20 20 40  0    Vision Screening Comments: August 2018

## 2017-08-31 ENCOUNTER — Other Ambulatory Visit (INDEPENDENT_AMBULATORY_CARE_PROVIDER_SITE_OTHER): Payer: PPO

## 2017-08-31 DIAGNOSIS — Z1211 Encounter for screening for malignant neoplasm of colon: Secondary | ICD-10-CM | POA: Diagnosis not present

## 2017-08-31 LAB — FECAL OCCULT BLOOD, IMMUNOCHEMICAL: FECAL OCCULT BLD: NEGATIVE

## 2017-09-02 ENCOUNTER — Other Ambulatory Visit: Payer: Self-pay | Admitting: Internal Medicine

## 2017-09-04 DIAGNOSIS — L570 Actinic keratosis: Secondary | ICD-10-CM | POA: Diagnosis not present

## 2017-09-04 DIAGNOSIS — D485 Neoplasm of uncertain behavior of skin: Secondary | ICD-10-CM | POA: Diagnosis not present

## 2017-09-04 DIAGNOSIS — L821 Other seborrheic keratosis: Secondary | ICD-10-CM | POA: Diagnosis not present

## 2017-09-04 DIAGNOSIS — D0439 Carcinoma in situ of skin of other parts of face: Secondary | ICD-10-CM | POA: Diagnosis not present

## 2017-10-24 DIAGNOSIS — D0439 Carcinoma in situ of skin of other parts of face: Secondary | ICD-10-CM | POA: Diagnosis not present

## 2017-11-14 ENCOUNTER — Other Ambulatory Visit: Payer: Self-pay | Admitting: Cardiovascular Disease

## 2017-12-19 ENCOUNTER — Other Ambulatory Visit: Payer: Self-pay | Admitting: Internal Medicine

## 2018-02-10 ENCOUNTER — Other Ambulatory Visit: Payer: Self-pay | Admitting: Cardiovascular Disease

## 2018-02-14 NOTE — Progress Notes (Signed)
Chief Complaint  Patient presents with  . Follow-up    CAD   History of Present Illness: 77 yo male with history of CAD, hyperlipidemia and GERD who is here today for cardiac follow up. He is known to have mild to moderate CAD with last cath in March 2018 showing 40% ostial LAD stenosis and mild disease in the RCA and intermediate Mota. LV funciton was normal. He has not tolerated Imdur in the past. He had bradycardia with Bystolic so it was stopped. He has been seen in the Pulmonary clinic for workup of dyspnea and is not felt to have significant lung disease. He has described mild chest pain with heavy exertion for years. He has been on Ranexa for chronic angina, presumed to be microvascular.  He is here today for follow up. The patient denies any chest pain, dyspnea, palpitations, lower extremity edema, orthopnea, PND, dizziness, near syncope or syncope.   Primary Care Physician: Venia Carbon, MD  Past Medical History:  Diagnosis Date  . Allergic rhinitis   . CAD (coronary artery disease)   . Chest pain   . Duodenal ulcer   . ED (erectile dysfunction)   . GERD (gastroesophageal reflux disease)   . History of colonoscopy   . Hyperlipidemia   . Osteoarthritis     Past Surgical History:  Procedure Laterality Date  . APPENDECTOMY  1964  . CARDIAC CATHETERIZATION  03/2010   diffuse early disease  . CATARACT EXTRACTION Right 04/18/2016  . INGUINAL HERNIA REPAIR Bilateral 1970   double hernia repair   . LEFT HEART CATH AND CORONARY ANGIOGRAPHY N/A 10/12/2016   Procedure: Left Heart Cath and Coronary Angiography;  Surgeon: Burnell Blanks, MD;  Location: [facility];  Service: Cardiovascular;  Laterality: N/A;  . RETINAL DETACHMENT SURGERY Right 2003  . TONSILLECTOMY      Current Outpatient Medications  Medication Sig Dispense Refill  . aspirin 81 MG tablet Take 81 mg by mouth daily.      . furosemide (LASIX) 40 MG tablet Take 1 tablet (40 mg total) by  mouth daily. 90 tablet 1  . losartan (COZAAR) 50 MG tablet Take 1 tablet (50 mg total) by mouth daily. 90 tablet 3  . nitroGLYCERIN (NITROSTAT) 0.4 MG SL tablet Place 1 tablet (0.4 mg total) under the tongue every 5 (five) minutes as needed for chest pain (x 3 doses). 25 tablet 6  . pantoprazole (PROTONIX) 40 MG tablet Take 1 tablet (40 mg total) by mouth daily. Take 30-60 min before first meal of the day 30 tablet 2  . ranitidine (ZANTAC) 150 MG tablet TAKE 1 TABLET BY MOUTH TWICE A DAY 180 tablet 2  . ranolazine (RANEXA) 500 MG 12 hr tablet Take 1 tablet (500 mg total) by mouth 2 (two) times daily. 60 tablet 3  . simvastatin (ZOCOR) 40 MG tablet TAKE ONE TABLET BY MOUTH EACH EVENING 90 tablet 3   No current facility-administered medications for this visit.     Allergies  Allergen Reactions  . Ibuprofen     REACTION: ulcer    Social History   Socioeconomic History  . Marital status: Married    Spouse name: Not on file  . Number of children: 2  . Years of education: Not on file  . Highest education level: Not on file  Occupational History  . 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 Financial resource strain: Not on file  .  Food insecurity:    Worry: Not on file    Inability: Not on file  . Transportation needs:    Medical: Not on file    Non-medical: Not on file  Tobacco Use  . Smoking status: Former Smoker    Packs/day: 1.00    Years: 10.00    Pack years: 10.00    Types: Cigars    Last attempt to quit: 07/18/2007    Years since quitting: 10.5  . Smokeless tobacco: Never Used  . Tobacco comment: Occ cigar - no cigarettes - stopped in 6-09  Substance and Sexual Activity  . Alcohol use: Yes    Alcohol/week: 0.6 oz    Types: 1 Standard drinks or equivalent per week    Comment: 3-4 drinks per week  . Drug use: No  . Sexual activity: Not on file  Lifestyle  . Physical activity:    Days per week: Not on file    Minutes per session: Not on file  .  Stress: Not on file  Relationships  . Social connections:    Talks on phone: Not on file    Gets together: Not on file    Attends religious service: Not on file    Active member of club or organization: Not on file    Attends meetings of clubs or organizations: Not on file    Relationship status: Not on file  . Intimate partner violence:    Fear of current or ex partner: Not on file    Emotionally abused: Not on file    Physically abused: Not on file    Forced sexual activity: Not on file  Other Topics Concern  . Not on file  Social History Narrative   Has living will   Wife would serve as health care POA--then son   Would accept resuscitation attempts but no prolonged artificial ventilation    No tube feeds if cognitively unaware    Family History  Problem Relation Age of Onset  . Coronary artery disease Mother        CABG in 49's  . Brain cancer Mother        that was metastaticfrom lung   . Stomach cancer Father 56  . Diabetes Sister   . Stroke Maternal Grandmother     Review of Systems:  As stated in the HPI and otherwise negative.   BP 140/70   Pulse (!) 55   Ht 5\' 6"  (1.676 m)   Wt 185 lb (83.9 kg)   SpO2 98%   BMI 29.86 kg/m   Physical Examination:  General: Well developed, well nourished, NAD  HEENT: OP clear, mucus membranes moist  SKIN: warm, dry. No rashes. Neuro: No focal deficits  Musculoskeletal: Muscle strength 5/5 all ext  Psychiatric: Mood and affect normal  Neck: No JVD, no carotid bruits, no thyromegaly, no lymphadenopathy.  Lungs:Clear bilaterally, no wheezes, rhonci, crackles Cardiovascular: Regular rate and rhythm. No murmurs, gallops or rubs. Abdomen:Soft. Bowel sounds present. Non-tender.  Extremities: No lower extremity edema. Pulses are 2 + in the bilateral DP/PT.  EKG:  EKG is ordered today. The ekg ordered today demonstrates Sinus with 1st degree AV block. PACs.   Recent Labs: 08/22/2017: ALT 12; BUN 21; Creatinine, Ser 1.07;  Hemoglobin 15.4; Platelets 241.0; Potassium 4.1; Sodium 138   Lipid Panel    Component Value Date/Time   CHOL 127 08/22/2017 0926   TRIG 84.0 08/22/2017 0926   HDL 41.20 08/22/2017 0926   CHOLHDL 3 08/22/2017 0926  VLDL 16.8 08/22/2017 0926   LDLCALC 69 08/22/2017 0926   LDLDIRECT 131.3 04/06/2009 0922     Wt Readings from Last 3 Encounters:  02/15/18 185 lb (83.9 kg)  08/22/17 191 lb (86.6 kg)  01/15/17 189 lb (85.7 kg)     Other studies Reviewed: Additional studies/ records that were reviewed today include: . Review of the above records demonstrates:    Assessment and Plan:   1. CAD with stable angina:  Chest pain is unchanged. He has chronic angina, likely microvascular in origin. Cardiac cath in March 2018 with stable mild CAD. Will continue ASA, Ranexa and statin. No beta blocker due to bradycardia in past while on beta blockers.    2. HTN: BP is well controlled. NO changes today.   3. Hyperlipidemia: Lipids followed in primary care. LDL at goal this year. Continue statin.   4. Sinus Bradycardia: No symptoms related to this.   Current medicines are reviewed at length with the patient today.  The patient does not have concerns regarding medicines.  The following changes have been made:  no change  Labs/ tests ordered today include:   Orders Placed This Encounter  Procedures  . EKG 12-Lead    Disposition:   FU with me in 12 months.    Signed, Lauree Chandler, MD 02/15/2018 10:31 AM    [facility] [address], [place], [place]  [address] Phone: [phone]; Fax: [phone]

## 2018-02-15 ENCOUNTER — Encounter: Payer: Self-pay | Admitting: Cardiovascular Disease

## 2018-02-15 ENCOUNTER — Ambulatory Visit: Payer: PPO | Admitting: Cardiovascular Disease

## 2018-02-15 VITALS — BP 140/70 | HR 55 | Ht 66.0 in | Wt 185.0 lb

## 2018-02-15 DIAGNOSIS — I25119 Atherosclerotic heart disease of native coronary artery with unspecified angina pectoris: Secondary | ICD-10-CM | POA: Diagnosis not present

## 2018-02-15 DIAGNOSIS — R001 Bradycardia, unspecified: Secondary | ICD-10-CM

## 2018-02-15 DIAGNOSIS — I10 Essential (primary) hypertension: Secondary | ICD-10-CM | POA: Diagnosis not present

## 2018-02-15 DIAGNOSIS — E7800 Pure hypercholesterolemia, unspecified: Secondary | ICD-10-CM

## 2018-02-15 MED ORDER — LOSARTAN POTASSIUM 50 MG PO TABS: 50.0000 mg | ORAL_TABLET | Freq: Every day | ORAL | 3 refills | Status: DC

## 2018-02-15 NOTE — Patient Instructions (Signed)

## 2018-02-26 DIAGNOSIS — L570 Actinic keratosis: Secondary | ICD-10-CM | POA: Diagnosis not present

## 2018-02-26 DIAGNOSIS — Z85828 Personal history of other malignant neoplasm of skin: Secondary | ICD-10-CM | POA: Diagnosis not present

## 2018-02-26 DIAGNOSIS — L814 Other melanin hyperpigmentation: Secondary | ICD-10-CM | POA: Diagnosis not present

## 2018-02-26 DIAGNOSIS — L821 Other seborrheic keratosis: Secondary | ICD-10-CM | POA: Diagnosis not present

## 2018-02-26 DIAGNOSIS — D1801 Hemangioma of skin and subcutaneous tissue: Secondary | ICD-10-CM | POA: Diagnosis not present

## 2018-03-13 ENCOUNTER — Encounter (INDEPENDENT_AMBULATORY_CARE_PROVIDER_SITE_OTHER): Payer: PPO | Admitting: Ophthalmology

## 2018-03-13 DIAGNOSIS — H338 Other retinal detachments: Secondary | ICD-10-CM | POA: Diagnosis not present

## 2018-03-13 DIAGNOSIS — H33103 Unspecified retinoschisis, bilateral: Secondary | ICD-10-CM | POA: Diagnosis not present

## 2018-03-13 DIAGNOSIS — H35371 Puckering of macula, right eye: Secondary | ICD-10-CM

## 2018-03-13 DIAGNOSIS — H35033 Hypertensive retinopathy, bilateral: Secondary | ICD-10-CM

## 2018-03-13 DIAGNOSIS — H43813 Vitreous degeneration, bilateral: Secondary | ICD-10-CM | POA: Diagnosis not present

## 2018-03-13 DIAGNOSIS — I10 Essential (primary) hypertension: Secondary | ICD-10-CM

## 2018-03-13 DIAGNOSIS — H59031 Cystoid macular edema following cataract surgery, right eye: Secondary | ICD-10-CM | POA: Diagnosis not present

## 2018-04-22 ENCOUNTER — Encounter: Payer: Self-pay | Admitting: Internal Medicine

## 2018-04-22 ENCOUNTER — Telehealth: Payer: Self-pay | Admitting: Internal Medicine

## 2018-04-22 ENCOUNTER — Ambulatory Visit (INDEPENDENT_AMBULATORY_CARE_PROVIDER_SITE_OTHER)
Admission: RE | Admit: 2018-04-22 | Discharge: 2018-04-22 | Disposition: A | Discharge status: Home / Self Care | Payer: PPO | Source: Ambulatory Visit | Attending: Internal Medicine | Admitting: Internal Medicine

## 2018-04-22 ENCOUNTER — Ambulatory Visit (INDEPENDENT_AMBULATORY_CARE_PROVIDER_SITE_OTHER): Payer: PPO | Admitting: Internal Medicine

## 2018-04-22 VITALS — BP 120/70 | HR 64 | Temp 97.7°F | Ht 66.0 in | Wt 183.0 lb

## 2018-04-22 DIAGNOSIS — S93401A Sprain of unspecified ligament of right ankle, initial encounter: Secondary | ICD-10-CM | POA: Diagnosis not present

## 2018-04-22 DIAGNOSIS — Z23 Encounter for immunization: Secondary | ICD-10-CM | POA: Diagnosis not present

## 2018-04-22 DIAGNOSIS — M25571 Pain in right ankle and joints of right foot: Secondary | ICD-10-CM

## 2018-04-22 DIAGNOSIS — S9001XA Contusion of right ankle, initial encounter: Secondary | ICD-10-CM | POA: Diagnosis not present

## 2018-04-22 NOTE — Telephone Encounter (Signed)
That would be good over the next 2 days or so---- 10-15 minutes of ice every 2 hours or so He should keep the foot elevated also

## 2018-04-22 NOTE — Assessment & Plan Note (Addendum)
Fall with twisting injury Significant bruising seen and tenderness Will check x-ray  Fortunately no fracture Will put him in walking boot for 1-2 weeks---then lace up or elastic brace Tylenol/aleve for pain Suggested walker instead of cane for stability for now

## 2018-04-22 NOTE — Telephone Encounter (Signed)
Copied from [place] [id number]. Topic: General - Other >> Apr 22, 2018  3:50 PM Marin Olp L wrote: Reason for CRM: Wants to know if Dr. Silvio Pate recommended icing his foot when not wearing the boot?

## 2018-04-22 NOTE — Telephone Encounter (Signed)
Spoken and notified patient of Dr Alla German comments. Patient verbalized understanding.

## 2018-04-22 NOTE — Addendum Note (Signed)
Addended by: Jacqualin Combes on: 04/22/2018 12:58 PM   Modules accepted: Orders

## 2018-04-22 NOTE — Progress Notes (Signed)
Subjective:    Patient ID: Jonathan Griffith, male    DOB: Oct 09, 1940, 77 y.o.   MRN: [id number]  HPI Here due fall 2 days ago Was using chain saw In ladder trimming tree--falling limb hit ladder and he fell off He was up 6-8 feet  Fell onto feet--then rolled onto back Felt like he twisted the right one Favored this one and then had some pain in left as well Significant swelling--- iced it regularly and it is some better Pain with walking---using cane to reduce weight on it  Tried 2 aleve daily in past 2 days  Current Outpatient Medications on File Prior to Visit  Medication Sig Dispense Refill  . aspirin 81 MG tablet Take 81 mg by mouth daily.      . furosemide (LASIX) 40 MG tablet Take 1 tablet (40 mg total) by mouth daily. 90 tablet 1  . losartan (COZAAR) 50 MG tablet Take 1 tablet (50 mg total) by mouth daily. 90 tablet 3  . nitroGLYCERIN (NITROSTAT) 0.4 MG SL tablet Place 1 tablet (0.4 mg total) under the tongue every 5 (five) minutes as needed for chest pain (x 3 doses). 25 tablet 6  . pantoprazole (PROTONIX) 40 MG tablet Take 1 tablet (40 mg total) by mouth daily. Take 30-60 min before first meal of the day 30 tablet 2  . ranitidine (ZANTAC) 150 MG tablet TAKE 1 TABLET BY MOUTH TWICE A DAY 180 tablet 2  . ranolazine (RANEXA) 500 MG 12 hr tablet Take 1 tablet (500 mg total) by mouth 2 (two) times daily. 60 tablet 3  . simvastatin (ZOCOR) 40 MG tablet TAKE ONE TABLET BY MOUTH EACH EVENING 90 tablet 3   No current facility-administered medications on file prior to visit.     Allergies  Allergen Reactions  . Ibuprofen     REACTION: ulcer    Past Medical History:  Diagnosis Date  . Allergic rhinitis   . CAD (coronary artery disease)   . Chest pain   . Duodenal ulcer   . ED (erectile dysfunction)   . GERD (gastroesophageal reflux disease)   . History of colonoscopy   . Hyperlipidemia   . Osteoarthritis     Past Surgical History:  Procedure Laterality Date  .  APPENDECTOMY  1964  . CARDIAC CATHETERIZATION  03/2010   diffuse early disease  . CATARACT EXTRACTION Right 04/18/2016  . INGUINAL HERNIA REPAIR Bilateral 1970   double hernia repair   . LEFT HEART CATH AND CORONARY ANGIOGRAPHY N/A 10/12/2016   Procedure: Left Heart Cath and Coronary Angiography;  Surgeon: Burnell Blanks, MD;  Location: [facility];  Service: Cardiovascular;  Laterality: N/A;  . RETINAL DETACHMENT SURGERY Right 2003  . TONSILLECTOMY      Family History  Problem Relation Age of Onset  . Coronary artery disease Mother        CABG in 22's  . Brain cancer Mother        that was metastaticfrom lung   . Stomach cancer Father 34  . Diabetes Sister   . Stroke Maternal Grandmother     Social History   Socioeconomic History  . Marital status: Married    Spouse name: Not on file  . Number of children: 2  . Years of education: Not on file  . Highest education level: Not on file  Occupational History  . 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 Financial resource strain: Not on  file  . Food insecurity:    Worry: Not on file    Inability: Not on file  . Transportation needs:    Medical: Not on file    Non-medical: Not on file  Tobacco Use  . Smoking status: Former Smoker    Packs/day: 1.00    Years: 10.00    Pack years: 10.00    Types: Cigars    Last attempt to quit: 07/18/2007    Years since quitting: 10.7  . Smokeless tobacco: Never Used  . Tobacco comment: Occ cigar - no cigarettes - stopped in 6-09  Substance and Sexual Activity  . Alcohol use: Yes    Alcohol/week: 1.0 standard drinks    Types: 1 Standard drinks or equivalent per week    Comment: 3-4 drinks per week  . Drug use: No  . Sexual activity: Not on file  Lifestyle  . Physical activity:    Days per week: Not on file    Minutes per session: Not on file  . Stress: Not on file  Relationships  . Social connections:    Talks on phone: Not on file    Gets  together: Not on file    Attends religious service: Not on file    Active member of club or organization: Not on file    Attends meetings of clubs or organizations: Not on file    Relationship status: Not on file  . Intimate partner violence:    Fear of current or ex partner: Not on file    Emotionally abused: Not on file    Physically abused: Not on file    Forced sexual activity: Not on file  Other Topics Concern  . Not on file  Social History Narrative   Has living will   Wife would serve as health care POA--then son   Would accept resuscitation attempts but no prolonged artificial ventilation    No tube feeds if cognitively unaware   Review of Systems No back pain Not sick--eating okay, etc    Objective:   Physical Exam  Constitutional: He appears well-developed. No distress.  Musculoskeletal:  Swelling and bruising of right medial ankle Significant tenderness over medial malleolus No foot or toe pain or tenderness            Assessment & Plan:

## 2018-04-23 ENCOUNTER — Telehealth: Payer: Self-pay | Admitting: Cardiovascular Disease

## 2018-04-23 NOTE — Telephone Encounter (Signed)
New Message   Pt c/o medication issue:  1. Name of Medication:   ranolazine (RANEXA) 500 MG 12 hr tablet    2. How are you currently taking this medication (dosage and times per day)?   3. Are you having a reaction (difficulty breathing--STAT)?   4. What is your medication issue? Patient is calling because he was reviewing his medication list and he does not believe he should be taking this medication. He wants to see about getting this removed from his list. He believes it was stopped because it was given him side effects. Please call to discuss. Patient is aware that Fraser Din is not in today

## 2018-04-24 ENCOUNTER — Encounter (INDEPENDENT_AMBULATORY_CARE_PROVIDER_SITE_OTHER): Payer: PPO | Admitting: Ophthalmology

## 2018-04-24 DIAGNOSIS — I10 Essential (primary) hypertension: Secondary | ICD-10-CM | POA: Diagnosis not present

## 2018-04-24 DIAGNOSIS — H59031 Cystoid macular edema following cataract surgery, right eye: Secondary | ICD-10-CM | POA: Diagnosis not present

## 2018-04-24 DIAGNOSIS — H43813 Vitreous degeneration, bilateral: Secondary | ICD-10-CM

## 2018-04-24 DIAGNOSIS — H338 Other retinal detachments: Secondary | ICD-10-CM

## 2018-04-24 DIAGNOSIS — H33103 Unspecified retinoschisis, bilateral: Secondary | ICD-10-CM

## 2018-04-24 DIAGNOSIS — H35373 Puckering of macula, bilateral: Secondary | ICD-10-CM

## 2018-04-24 DIAGNOSIS — H35033 Hypertensive retinopathy, bilateral: Secondary | ICD-10-CM | POA: Diagnosis not present

## 2018-04-24 NOTE — Telephone Encounter (Signed)
I spoke with pt and gave him information regarding start date of Ranexa.  He reports he has not been taking this for a long time.  Does not have any medication bottles for this and can't remember when he stopped taking.  He has checked with his pharmacy and cost is $45 per month. He is concerned about cost.  He has not been having any chest pain.  He will stay off Ranexa for now but call us if he begins having any chest pain.  Will remove from his med list.

## 2018-04-24 NOTE — Telephone Encounter (Signed)
Chart reviewed. Per office note from 11/07/16 Ranexa was started in March 2015 but it did not help so pt stopped it. Pt wanted to try Ranexa again so it was restarted at 11/07/16 office visit. It has been on his list at the last 2 office visits. He has not tolerated Imdur.  Also had bradycardia with Bystolic and it was stopped.  I placed call to pt and left message to call back

## 2018-05-12 ENCOUNTER — Other Ambulatory Visit: Payer: Self-pay | Admitting: Cardiovascular Disease

## 2018-05-19 ENCOUNTER — Other Ambulatory Visit: Payer: Self-pay | Admitting: Cardiovascular Disease

## 2018-05-20 ENCOUNTER — Telehealth: Payer: Self-pay | Admitting: Cardiovascular Disease

## 2018-05-20 MED ORDER — LOSARTAN POTASSIUM 25 MG PO TABS: 50.0000 mg | ORAL_TABLET | Freq: Every day | ORAL | 3 refills | Status: DC

## 2018-05-20 NOTE — Telephone Encounter (Signed)
Spoke with pharmacist and they have received the rx and they are getting it ready for the patient.

## 2018-05-20 NOTE — Telephone Encounter (Signed)
Called patient back. [facility] stated [facility] has losartan 50 mg on backorder and they have sent a request for losartan 25 mg tablets to equal 50 mg. Patient is going to be out of medication. Will send in Losartan 25 mg, take two tablets by mouth daily for Losartan 50 mg dose.

## 2018-05-20 NOTE — Telephone Encounter (Signed)
  Pt c/o medication issue:  1. Name of Medication: losartan (COZAAR) 50 MG tablet  2. How are you currently taking this medication (dosage and times per day)? Yes, Take 1 tablet (50 mg total) by mouth daily.  3. Are you having a reaction (difficulty breathing--STAT)?  No  4. What is your medication issue? Patient states pharmacist told him that you can no longer get the 50 mg tablets so he will have to take two 25 mg tablets a day. Patient wants to know if there is another medication that he can take so that he won't have to take two pills a day.

## 2018-05-20 NOTE — Telephone Encounter (Signed)
 *  STAT* If patient is at the pharmacy, call can be transferred to refill team.   1. Which medications need to be refilled? (please list name of each medication and dose if known) losartan (COZAAR) 50 MG tablet  2. Which pharmacy/location (including street and city if local pharmacy) is medication to be sent to? [facility] [place]  3. Do they need a 30 day or 90 day supply? 90  Patient cannot get 50 mg, pharmacy only has 25 mg but needs a new script to give him enough to be able to take 2 a day at 25 mg. Patient states he only has one pill left and pharmacy told him they have requested refill but have not received anything from our office.

## 2018-05-21 NOTE — Telephone Encounter (Signed)
Outpatient Medication Detail    Disp Refills Start End   losartan (COZAAR) 25 MG tablet 180 tablet 3 05/20/2018    Sig - Route: Take 2 tablets (50 mg total) by mouth daily. - Oral   Sent to pharmacy as: losartan (COZAAR) 25 MG tablet   E-Prescribing Status: Receipt confirmed by pharmacy (05/20/2018 3:43 PM EST)   Pharmacy   [id number] [place], [place]

## 2018-06-03 ENCOUNTER — Encounter: Payer: Self-pay | Admitting: Internal Medicine

## 2018-06-03 ENCOUNTER — Telehealth: Payer: Self-pay | Admitting: Internal Medicine

## 2018-06-03 ENCOUNTER — Ambulatory Visit (INDEPENDENT_AMBULATORY_CARE_PROVIDER_SITE_OTHER): Payer: PPO | Admitting: Internal Medicine

## 2018-06-03 VITALS — BP 122/78 | HR 52 | Temp 97.6°F | Ht 66.0 in | Wt 190.0 lb

## 2018-06-03 DIAGNOSIS — R030 Elevated blood-pressure reading, without diagnosis of hypertension: Secondary | ICD-10-CM | POA: Diagnosis not present

## 2018-06-03 DIAGNOSIS — R42 Dizziness and giddiness: Secondary | ICD-10-CM

## 2018-06-03 NOTE — Assessment & Plan Note (Signed)
BP Readings from Last 3 Encounters:  06/03/18 122/78  04/22/18 120/70  02/15/18 140/70   Better now Suspect it was situational Repeat 144/68 by me on right after our discussions Don't think he needs increase in meds

## 2018-06-03 NOTE — Assessment & Plan Note (Signed)
Discussed his recent symptoms Was brief and short lived Can use meclizine if any persistent symptoms

## 2018-06-03 NOTE — Progress Notes (Signed)
Subjective:    Patient ID: Jonathan Griffith, male    DOB: January 04, 1941, 77 y.o.   MRN: [id number]  HPI Here due to elevated blood pressure  Has had 4 different checks on his BP Felt "swimmy headed" if he put his head down----- 168/88 by EMT [place] to fire house--- 140/90 Went to [facility]-- 185/90  Notes the "swimmy headedness" when turning over in bed Takes a bit to get steady when he arises  No chest pain No dizziness No syncope No edema  Current Outpatient Medications on File Prior to Visit  Medication Sig Dispense Refill  . aspirin 81 MG tablet Take 81 mg by mouth daily.      . furosemide (LASIX) 40 MG tablet Take 1 tablet (40 mg total) by mouth daily. 90 tablet 1  . losartan (COZAAR) 25 MG tablet Take 2 tablets (50 mg total) by mouth daily. 180 tablet 3  . nitroGLYCERIN (NITROSTAT) 0.4 MG SL tablet Place 1 tablet (0.4 mg total) under the tongue every 5 (five) minutes as needed for chest pain (x 3 doses). 25 tablet 6  . pantoprazole (PROTONIX) 40 MG tablet Take 1 tablet (40 mg total) by mouth daily. Take 30-60 min before first meal of the day 30 tablet 2  . ranitidine (ZANTAC) 150 MG tablet TAKE 1 TABLET BY MOUTH TWICE A DAY 180 tablet 2  . simvastatin (ZOCOR) 40 MG tablet TAKE ONE TABLET BY MOUTH EACH EVENING 90 tablet 3   No current facility-administered medications on file prior to visit.     Allergies  Allergen Reactions  . Ibuprofen     REACTION: ulcer    Past Medical History:  Diagnosis Date  . Allergic rhinitis   . CAD (coronary artery disease)   . Chest pain   . Duodenal ulcer   . ED (erectile dysfunction)   . GERD (gastroesophageal reflux disease)   . History of colonoscopy   . Hyperlipidemia   . Osteoarthritis     Past Surgical History:  Procedure Laterality Date  . APPENDECTOMY  1964  . CARDIAC CATHETERIZATION  03/2010   diffuse early disease  . CATARACT EXTRACTION Right 04/18/2016  . INGUINAL HERNIA REPAIR Bilateral 1970   double hernia repair   .  LEFT HEART CATH AND CORONARY ANGIOGRAPHY N/A 10/12/2016   Procedure: Left Heart Cath and Coronary Angiography;  Surgeon: Burnell Blanks, MD;  Location: [facility];  Service: Cardiovascular;  Laterality: N/A;  . RETINAL DETACHMENT SURGERY Right 2003  . TONSILLECTOMY      Family History  Problem Relation Age of Onset  . Coronary artery disease Mother        CABG in 35's  . Brain cancer Mother        that was metastaticfrom lung   . Stomach cancer Father 2  . Diabetes Sister   . Stroke Maternal Grandmother     Social History   Socioeconomic History  . Marital status: Married    Spouse name: Not on file  . Number of children: 2  . Years of education: Not on file  . Highest education level: Not on file  Occupational History  . 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 Financial resource strain: Not on file  . Food insecurity:    Worry: Not on file    Inability: Not on file  . Transportation needs:    Medical: Not on file    Non-medical: Not on file  Tobacco  Use  . Smoking status: Former Smoker    Packs/day: 1.00    Years: 10.00    Pack years: 10.00    Types: Cigars    Last attempt to quit: 07/18/2007    Years since quitting: 10.8  . Smokeless tobacco: Never Used  . Tobacco comment: Occ cigar - no cigarettes - stopped in 6-09  Substance and Sexual Activity  . Alcohol use: Yes    Alcohol/week: 1.0 standard drinks    Types: 1 Standard drinks or equivalent per week    Comment: 3-4 drinks per week  . Drug use: No  . Sexual activity: Not on file  Lifestyle  . Physical activity:    Days per week: Not on file    Minutes per session: Not on file  . Stress: Not on file  Relationships  . Social connections:    Talks on phone: Not on file    Gets together: Not on file    Attends religious service: Not on file    Active member of club or organization: Not on file    Attends meetings of clubs or organizations: Not on file     Relationship status: Not on file  . Intimate partner violence:    Fear of current or ex partner: Not on file    Emotionally abused: Not on file    Physically abused: Not on file    Forced sexual activity: Not on file  Other Topics Concern  . Not on file  Social History Narrative   Has living will   Wife would serve as health care POA--then son   Would accept resuscitation attempts but no prolonged artificial ventilation    No tube feeds if cognitively unaware   Review of Systems Occasional frontal headache--last 2 days ago. 1 aleve relieves No N/V Appetite is great Still some pain in ankle Has been regular with his medications    Objective:   Physical Exam  Constitutional: He is oriented to person, place, and time. He appears well-developed. No distress.  Eyes:  ?slight horizontal nystagmus with right lateral gaze  Neck: No thyromegaly present.  Cardiovascular: Normal rate, regular rhythm and normal heart sounds. Exam reveals no gallop.  No murmur heard. Respiratory: Effort normal and breath sounds normal. No respiratory distress. He has no wheezes. He has no rales.  Lymphadenopathy:    He has no cervical adenopathy.  Neurological: He is oriented to person, place, and time. He displays a negative Romberg sign. Coordination and gait normal.           Assessment & Plan:

## 2018-06-03 NOTE — Telephone Encounter (Signed)
That was fine to put him in this afternoon.

## 2018-06-03 NOTE — Telephone Encounter (Signed)
Pt called [facility] on 06/01/18 at 10:59am to report that his BP was 160/80 and he felt a little dizzy and the dizziness started 2-3 days ago.  He stated he was well hydrated and no taking any BP medications.  TH advised pt to go to the ED immediately, but he did not want to go.

## 2018-06-03 NOTE — Patient Instructions (Signed)
If the vertigo (swimmy headedness) returns, you can try over the counter meclizine 25 mg up to three times a day till the symptoms go away.

## 2018-07-03 ENCOUNTER — Encounter (INDEPENDENT_AMBULATORY_CARE_PROVIDER_SITE_OTHER): Payer: PPO | Admitting: Ophthalmology

## 2018-07-03 DIAGNOSIS — H338 Other retinal detachments: Secondary | ICD-10-CM | POA: Diagnosis not present

## 2018-07-03 DIAGNOSIS — H35373 Puckering of macula, bilateral: Secondary | ICD-10-CM | POA: Diagnosis not present

## 2018-07-03 DIAGNOSIS — I10 Essential (primary) hypertension: Secondary | ICD-10-CM | POA: Diagnosis not present

## 2018-07-03 DIAGNOSIS — H43813 Vitreous degeneration, bilateral: Secondary | ICD-10-CM | POA: Diagnosis not present

## 2018-07-03 DIAGNOSIS — H35033 Hypertensive retinopathy, bilateral: Secondary | ICD-10-CM | POA: Diagnosis not present

## 2018-07-03 DIAGNOSIS — H33103 Unspecified retinoschisis, bilateral: Secondary | ICD-10-CM | POA: Diagnosis not present

## 2018-07-03 DIAGNOSIS — H59031 Cystoid macular edema following cataract surgery, right eye: Secondary | ICD-10-CM

## 2018-08-04 ENCOUNTER — Encounter: Payer: Self-pay | Admitting: Family Medicine

## 2018-08-04 NOTE — Progress Notes (Signed)
Dr. Frederico Hamman T. , MD, [facility] [place], [address] Phone: [phone] Fax: [phone]  08/05/2018  Patient: Jonathan Griffith, MRN: [id number], DOB: October 15, 1940, 78 y.o.  Primary Physician:  Venia Carbon, MD   Chief Complaint  Patient presents with  . Leg Swelling    Right-Fell off ladder back in October  . Foot Pain    Right   Subjective:   Jonathan Griffith is a 78 y.o. very pleasant male patient who presents with the following:  Complex fall 04/20/2018 [facility] where he was on a ladder trimming a tree and fell off, and he was up around 6-8 feet at the time.  He fell onto his feet and then rolled on his back.  Felt like he twisted the R ankle. Also had some pain in the left at the time.  He had a lot of swelling at the time of injury and used a cane for a while.   Initial 04/22/2018 plain films were negative.   He is here today with ongoing ankle pain for reevaluation.  Now having some persistent swelling and pain in the R ankle and foot.   Wore a short CAM walker boot for 6 weeks.  He is having some mild diffuse right-sided lower extremity swelling, and his pain is centered around the medial malleolus as well as the talus.  Also having some pain in the calcaneus.  He is walking with a limp right now.  Past Medical History, Surgical History, Social History, Family History, Problem List, Medications, and Allergies have been reviewed and updated if relevant.  Patient Active Problem List   Diagnosis Date Noted  . Vertigo 06/03/2018  . Elevated blood pressure reading 06/03/2018  . Right ankle pain 04/22/2018  . Sinus bradycardia 11/07/2016  . Sleep disorder 03/24/2016  . Advanced directives, counseling/discussion 05/13/2014  . GERD (gastroesophageal reflux disease)   . DOE (dyspnea on exertion) 05/08/2012  . Routine general medical examination at a health care facility 05/05/2011  . Coronary artery disease  with angina pectoris ([place]) 04/19/2010  . Osteoarthritis of more than one site 03/15/2007  . Hyperlipemia 03/04/2007  . ALLERGIC RHINITIS 03/04/2007    Past Medical History:  Diagnosis Date  . Allergic rhinitis   . CAD (coronary artery disease)   . Chest pain   . Duodenal ulcer   . ED (erectile dysfunction)   . GERD (gastroesophageal reflux disease)   . History of colonoscopy   . Hyperlipidemia   . Osteoarthritis     Past Surgical History:  Procedure Laterality Date  . APPENDECTOMY  1964  . CARDIAC CATHETERIZATION  03/2010   diffuse early disease  . CATARACT EXTRACTION Right 04/18/2016  . INGUINAL HERNIA REPAIR Bilateral 1970   double hernia repair   . LEFT HEART CATH AND CORONARY ANGIOGRAPHY N/A 10/12/2016   Procedure: Left Heart Cath and Coronary Angiography;  Surgeon: Burnell Blanks, MD;  Location: [facility];  Service: Cardiovascular;  Laterality: N/A;  . RETINAL DETACHMENT SURGERY Right 2003  . TONSILLECTOMY      Social History   Socioeconomic History  . Marital status: Married    Spouse name: Not on file  . Number of children: 2  . Years of education: Not on file  . Highest education level: Not on file  Occupational History  . 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 Financial resource strain: Not on file  .  Food insecurity:    Worry: Not on file    Inability: Not on file  . Transportation needs:    Medical: Not on file    Non-medical: Not on file  Tobacco Use  . Smoking status: Former Smoker    Packs/day: 1.00    Years: 10.00    Pack years: 10.00    Types: Cigars    Last attempt to quit: 07/18/2007    Years since quitting: 11.0  . Smokeless tobacco: Never Used  . Tobacco comment: Occ cigar - no cigarettes - stopped in 6-09  Substance and Sexual Activity  . Alcohol use: Yes    Alcohol/week: 1.0 standard drinks    Types: 1 Standard drinks or equivalent per week    Comment: 3-4 drinks per week  . Drug use: No  .  Sexual activity: Not on file  Lifestyle  . Physical activity:    Days per week: Not on file    Minutes per session: Not on file  . Stress: Not on file  Relationships  . Social connections:    Talks on phone: Not on file    Gets together: Not on file    Attends religious service: Not on file    Active member of club or organization: Not on file    Attends meetings of clubs or organizations: Not on file    Relationship status: Not on file  . Intimate partner violence:    Fear of current or ex partner: Not on file    Emotionally abused: Not on file    Physically abused: Not on file    Forced sexual activity: Not on file  Other Topics Concern  . Not on file  Social History Narrative   Has living will   Wife would serve as health care POA--then son   Would accept resuscitation attempts but no prolonged artificial ventilation    No tube feeds if cognitively unaware    Family History  Problem Relation Age of Onset  . Coronary artery disease Mother        CABG in 42's  . Brain cancer Mother        that was metastaticfrom lung   . Stomach cancer Father 73  . Diabetes Sister   . Stroke Maternal Grandmother     Allergies  Allergen Reactions  . Ibuprofen     REACTION: ulcer    Medication list reviewed and updated in full in [facility].  GEN: No fevers, chills. Nontoxic. Primarily MSK c/o today. MSK: Detailed in the HPI GI: tolerating PO intake without difficulty Neuro: No numbness, parasthesias, or tingling associated. Otherwise the pertinent positives of the ROS are noted above.   Objective:   Pulse (!) 51   Temp 98.5 F (36.9 C) (Oral)   Ht 5\' 6"  (1.676 m)   Wt 194 lb 8 oz (88.2 kg)   BMI 31.39 kg/m    GEN: WDWN, NAD, Non-toxic, Alert & Oriented x 3 HEENT: Atraumatic, Normocephalic.  Ears and Nose: No external deformity. EXTR: No clubbing/cyanosis/edema NEURO: Normal gait with notable antalgic limp. PSYCH: Normally interactive. Conversant. Not  depressed or anxious appearing.  Calm demeanor.    Right foot and ankle: Nontender with syndesmotic squeeze.  The fibula is nontender approximately through the lateral malleolus.  Proximal and midshaft tibia are nontender.  The medial malleolus is tender to palpation.  He also has tenderness at the talus.  The patient notes tenderness at the calcaneus, but there is no significant tenderness  with deep squeeze.  No significant Achilles tenderness.  No tenderness at the navicular, cuboid, or cuneiforms.  The metatarsal shafts are nontender as well as all of the phalanges.  Radiology: Dg Ankle 2 Views Right  Result Date: 04/22/2018 CLINICAL DATA:  78 year old male twisted ankle 2 days ago. Initial encounter. EXAM: RIGHT ANKLE - 2 VIEW COMPARISON:  None. FINDINGS: Two-view examination without fracture or dislocation. Soft tissue swelling lateral malleolar region may be related to soft tissue injury. Ankle mortise appears intact. Vascular calcifications project over the medial malleolus. Moderate to large plantar spur. Small spur Achilles tendon insertion site. IMPRESSION: 1. Two-view examination without fracture or dislocation. 2. Soft tissue swelling lateral malleolar region may be related to soft tissue injury. 3. Moderate to large plantar spur. Small spur Achilles tendon insertion site. Electronically Signed   By: Genia Del M.D.   On: 04/22/2018 12:25   Dg Ankle Complete Right  Result Date: 08/05/2018 CLINICAL DATA:  78 year old male with right ankle and heel pain. Fell from ladder 3 months ago and continued pain and swelling. Initial encounter. EXAM: RIGHT ANKLE - COMPLETE 3+ VIEW COMPARISON:  Right calcaneal films same date dictated separately. FINDINGS: Irregularity of the medial malleolus with mild bony overgrowth. Adjacent soft tissue swelling. Findings may reflect result of prior fracture and/or or degenerative changes. Mild degenerative changes medial malleolar-medial talar dome articulation.  Moderately large plantar spur. Small spur Achilles tendon insertion site. Tiny ossific structure talar neck region may be related to avulsion fracture. Vascular calcifications. IMPRESSION: 1. Irregularity of the medial malleolus with mild bony overgrowth. Adjacent soft tissue swelling. Findings may reflect result of prior fracture and/or or degenerative changes. Mild degenerative changes medial malleolar-medial talar dome articulation. 2. Moderately large plantar spur. 3. Small spur Achilles tendon insertion site. 4. Tiny ossific structure talar neck region may be related to avulsion fracture. Results discussed with Dr. Edilia Bo at time report. Electronically Signed   By: Genia Del M.D.   On: 08/05/2018 09:49   Dg Os Calcis Right  Result Date: 08/05/2018 CLINICAL DATA:  78 year old male with right ankle and heel pain. Fell from ladder 3 months ago and continued pain and swelling. Initial encounter. EXAM: RIGHT OS CALCIS - 2+ VIEW COMPARISON:  Right ankle same date dictated separately. FINDINGS: Moderately large plantar spur. Small spur Achilles tendon insertion site. Tiny osteophytic structure adjacent to the talar neck may represent result of avulsion fracture. Vascular calcifications. IMPRESSION: 1. Moderately large plantar spur. 2. Small spur Achilles tendon insertion site. 3. Tiny osteophytic structure adjacent to the talar neck may represent result of avulsion fracture. 4. Vascular calcifications. 5. Please see ankle report. Electronically Signed   By: Genia Del M.D.   On: 08/05/2018 09:46     Assessment and Plan:   Nondisplaced fracture of medial malleolus of left tibia, subsequent encounter for closed fracture with routine healing  Right ankle pain, unspecified chronicity - Plan: DG Ankle Complete Right, DG Os Calcis Right  Pain of right heel  Avulsion fracture of right talus with routine healing  >25 minutes spent in face to face time with patient, >50% spent in counselling or  coordination of care   Plain films reviewed with radiology over the phone.  Exam, history, films c/w occult medial malleolar fracture healed now.  Initially immobilized x 6 weeks in a short cam walker boot and some residual pain and swelling. Also with a small prior avulsion fx at talus, as well.   Immobilize x 2 more weeks with  regular rom, icing, recheck next month.  Anticipate multiple more months for resolution of symptoms gradually.  Follow-up: Return in about 5 weeks (around 09/09/2018).  Orders Placed This Encounter  Procedures  . DG Ankle Complete Right  . DG Os Calcis Right    Signed,   T. , MD   Outpatient Encounter Medications as of 08/05/2018  Medication Sig  . aspirin 81 MG tablet Take 81 mg by mouth daily.    . furosemide (LASIX) 40 MG tablet Take 1 tablet (40 mg total) by mouth daily.  Marland Kitchen losartan (COZAAR) 25 MG tablet Take 2 tablets (50 mg total) by mouth daily.  . nitroGLYCERIN (NITROSTAT) 0.4 MG SL tablet Place 1 tablet (0.4 mg total) under the tongue every 5 (five) minutes as needed for chest pain (x 3 doses).  . pantoprazole (PROTONIX) 40 MG tablet Take 1 tablet (40 mg total) by mouth daily. Take 30-60 min before first meal of the day  . PROLENSA 0.07 % SOLN INSTILL 1 DROP IN THE RIGHT EYE AT BEDTIME  . ranitidine (ZANTAC) 150 MG tablet TAKE 1 TABLET BY MOUTH TWICE A DAY  . simvastatin (ZOCOR) 40 MG tablet TAKE ONE TABLET BY MOUTH EACH EVENING   No facility-administered encounter medications on file as of 08/05/2018.

## 2018-08-05 ENCOUNTER — Ambulatory Visit (INDEPENDENT_AMBULATORY_CARE_PROVIDER_SITE_OTHER): Payer: PPO | Admitting: Family Medicine

## 2018-08-05 ENCOUNTER — Encounter: Payer: Self-pay | Admitting: Family Medicine

## 2018-08-05 ENCOUNTER — Ambulatory Visit (INDEPENDENT_AMBULATORY_CARE_PROVIDER_SITE_OTHER)
Admission: RE | Admit: 2018-08-05 | Discharge: 2018-08-05 | Disposition: A | Discharge status: Home / Self Care | Payer: PPO | Source: Ambulatory Visit | Attending: Family Medicine | Admitting: Family Medicine

## 2018-08-05 ENCOUNTER — Encounter: Payer: Self-pay | Admitting: *Deleted

## 2018-08-05 VITALS — BP 174/64 | HR 51 | Temp 98.5°F | Ht 66.0 in | Wt 194.5 lb

## 2018-08-05 DIAGNOSIS — M79671 Pain in right foot: Secondary | ICD-10-CM

## 2018-08-05 DIAGNOSIS — S92151D Displaced avulsion fracture (chip fracture) of right talus, subsequent encounter for fracture with routine healing: Secondary | ICD-10-CM

## 2018-08-05 DIAGNOSIS — M25571 Pain in right ankle and joints of right foot: Secondary | ICD-10-CM | POA: Diagnosis not present

## 2018-08-05 DIAGNOSIS — M7731 Calcaneal spur, right foot: Secondary | ICD-10-CM | POA: Diagnosis not present

## 2018-08-05 DIAGNOSIS — S8255XD Nondisplaced fracture of medial malleolus of left tibia, subsequent encounter for closed fracture with routine healing: Secondary | ICD-10-CM

## 2018-08-05 DIAGNOSIS — M19071 Primary osteoarthritis, right ankle and foot: Secondary | ICD-10-CM | POA: Diagnosis not present

## 2018-08-05 NOTE — Patient Instructions (Signed)
Go back to your CAM boot for 2 weeks, then stop.  Work on range of motion at least twice a day. Ice your ankle at least twice a day for 20 minutes.

## 2018-08-12 ENCOUNTER — Other Ambulatory Visit: Payer: Self-pay | Admitting: Internal Medicine

## 2018-08-30 ENCOUNTER — Encounter: Payer: Self-pay | Admitting: Internal Medicine

## 2018-08-30 ENCOUNTER — Ambulatory Visit (INDEPENDENT_AMBULATORY_CARE_PROVIDER_SITE_OTHER): Payer: PPO | Admitting: Internal Medicine

## 2018-08-30 VITALS — BP 130/78 | HR 48 | Temp 97.5°F | Ht 65.75 in | Wt 188.0 lb

## 2018-08-30 DIAGNOSIS — I25119 Atherosclerotic heart disease of native coronary artery with unspecified angina pectoris: Secondary | ICD-10-CM | POA: Diagnosis not present

## 2018-08-30 DIAGNOSIS — Z7189 Other specified counseling: Secondary | ICD-10-CM

## 2018-08-30 DIAGNOSIS — K219 Gastro-esophageal reflux disease without esophagitis: Secondary | ICD-10-CM

## 2018-08-30 DIAGNOSIS — Z1211 Encounter for screening for malignant neoplasm of colon: Secondary | ICD-10-CM

## 2018-08-30 DIAGNOSIS — M159 Polyosteoarthritis, unspecified: Secondary | ICD-10-CM

## 2018-08-30 DIAGNOSIS — Z0000 Encounter for general adult medical examination without abnormal findings: Secondary | ICD-10-CM

## 2018-08-30 DIAGNOSIS — M150 Primary generalized (osteo)arthritis: Secondary | ICD-10-CM

## 2018-08-30 LAB — COMPREHENSIVE METABOLIC PANEL
ALT: 14 U/L (ref 0–53)
AST: 18 U/L (ref 0–37)
Albumin: 4.4 g/dL (ref 3.5–5.2)
Alkaline Phosphatase: 94 U/L (ref 39–117)
BUN: 21 mg/dL (ref 6–23)
CO2: 30 mEq/L (ref 19–32)
Calcium: 9.5 mg/dL (ref 8.4–10.5)
Chloride: 102 mEq/L (ref 96–112)
Creatinine, Ser: 1.14 mg/dL (ref 0.40–1.50)
GFR: 61.96 mL/min (ref >60.00)
Glucose, Bld: 98 mg/dL (ref 70–99)
POTASSIUM: 4 meq/L (ref 3.5–5.1)
Sodium: 140 mEq/L (ref 135–145)
Total Bilirubin: 0.7 mg/dL (ref 0.2–1.2)
Total Protein: 7.1 g/dL (ref 6.0–8.3)

## 2018-08-30 LAB — CBC
HCT: 47.2 % (ref 39.0–52.0)
Hemoglobin: 16.1 g/dL (ref 13.0–17.0)
MCHC: 34.2 g/dL (ref 30.0–36.0)
MCV: 87.4 fl (ref 78.0–100.0)
Platelets: 258 10*3/uL (ref 150.0–400.0)
RBC: 5.4 Mil/uL (ref 4.22–5.81)
RDW: 13.2 % (ref 11.5–15.5)
WBC: 6.1 10*3/uL (ref 4.0–10.5)

## 2018-08-30 LAB — LIPID PANEL
Cholesterol: 144 mg/dL (ref 0–200)
HDL: 47.6 mg/dL (ref >39.00)
LDL Cholesterol: 69 mg/dL (ref 0–99)
NONHDL: 96.53
Total CHOL/HDL Ratio: 3
Triglycerides: 140 mg/dL (ref 0.0–149.0)
VLDL: 28 mg/dL (ref 0.0–40.0)

## 2018-08-30 LAB — T4, FREE: FREE T4: 0.97 ng/dL (ref 0.60–1.60)

## 2018-08-30 MED ORDER — TETANUS-DIPHTHERIA TOXOIDS TD 5-2 LFU IM INJ: 0.5000 mL | INJECTION | Freq: Once | INTRAMUSCULAR | 0 refills | Status: AC

## 2018-08-30 NOTE — Assessment & Plan Note (Signed)
See social history 

## 2018-08-30 NOTE — Progress Notes (Signed)
Subjective:    Patient ID: Jonathan Griffith, male    DOB: March 01, 1941, 78 y.o.   MRN: [id number]  HPI Here for Medicare wellness visit and follow up of chronic health conditions Reviewed form and advanced directives Reviewed other doctors No tobacco Still enjoys bourbon--1-2 per day [place] off ladder---discussed safety Vision and hearing are fine No depression or anhedonia Remains independent in instrumental ADLs No problems with memory  Recovering from ankle fracture Continues to go to gym and walk dog Some hand pain as well  Ongoing DOE--walking dog Will have some chest pain--brief (hasn't needed nitro) Wonders if it is indigestion--takes the ranitidine regularly No dysphagia No palpitations No dizziness or syncope of late No ankle edema other than from the injury Hasn't needed the furosemide in some time  Voids okay Nocturia x 1 Flow is fine--but occasional dribbling if he doesn't rush  Current Outpatient Medications on File Prior to Visit  Medication Sig Dispense Refill  . aspirin 81 MG tablet Take 81 mg by mouth daily.      Marland Kitchen losartan (COZAAR) 25 MG tablet Take 2 tablets (50 mg total) by mouth daily. 180 tablet 3  . nitroGLYCERIN (NITROSTAT) 0.4 MG SL tablet Place 1 tablet (0.4 mg total) under the tongue every 5 (five) minutes as needed for chest pain (x 3 doses). 25 tablet 6  . PROLENSA 0.07 % SOLN INSTILL 1 DROP IN THE RIGHT EYE AT BEDTIME    . ranitidine (ZANTAC) 150 MG tablet TAKE 1 TABLET BY MOUTH TWICE A DAY 180 tablet 2  . simvastatin (ZOCOR) 40 MG tablet TAKE ONE TABLET BY MOUTH EACH EVENING 90 tablet 0   No current facility-administered medications on file prior to visit.     Allergies  Allergen Reactions  . Ibuprofen     REACTION: ulcer    Past Medical History:  Diagnosis Date  . Allergic rhinitis   . CAD (coronary artery disease)   . Chest pain   . Duodenal ulcer   . ED (erectile dysfunction)   . GERD (gastroesophageal reflux disease)   .  History of colonoscopy   . Hyperlipidemia   . Osteoarthritis     Past Surgical History:  Procedure Laterality Date  . APPENDECTOMY  1964  . CARDIAC CATHETERIZATION  03/2010   diffuse early disease  . CATARACT EXTRACTION Right 04/18/2016  . INGUINAL HERNIA REPAIR Bilateral 1970   double hernia repair   . LEFT HEART CATH AND CORONARY ANGIOGRAPHY N/A 10/12/2016   Procedure: Left Heart Cath and Coronary Angiography;  Surgeon: Burnell Blanks, MD;  Location: [facility];  Service: Cardiovascular;  Laterality: N/A;  . RETINAL DETACHMENT SURGERY Right 2003  . TONSILLECTOMY      Family History  Problem Relation Age of Onset  . Coronary artery disease Mother        CABG in 25's  . Brain cancer Mother        that was metastaticfrom lung   . Stomach cancer Father 22  . Diabetes Sister   . Stroke Maternal Grandmother     Social History   Socioeconomic History  . Marital status: Married    Spouse name: Not on file  . Number of children: 2  . Years of education: Not on file  . Highest education level: Not on file  Occupational History  . 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 Financial resource strain: Not on file  . Food insecurity:  Worry: Not on file    Inability: Not on file  . Transportation needs:    Medical: Not on file    Non-medical: Not on file  Tobacco Use  . Smoking status: Former Smoker    Packs/day: 1.00    Years: 10.00    Pack years: 10.00    Types: Cigars    Last attempt to quit: 07/18/2007    Years since quitting: 11.1  . Smokeless tobacco: Never Used  . Tobacco comment: Occ cigar - no cigarettes - stopped in 6-09  Substance and Sexual Activity  . Alcohol use: Yes    Alcohol/week: 1.0 standard drinks    Types: 1 Standard drinks or equivalent per week    Comment: 3-4 drinks per week  . Drug use: No  . Sexual activity: Not on file  Lifestyle  . Physical activity:    Days per week: Not on file    Minutes per  session: Not on file  . Stress: Not on file  Relationships  . Social connections:    Talks on phone: Not on file    Gets together: Not on file    Attends religious service: Not on file    Active member of club or organization: Not on file    Attends meetings of clubs or organizations: Not on file    Relationship status: Not on file  . Intimate partner violence:    Fear of current or ex partner: Not on file    Emotionally abused: Not on file    Physically abused: Not on file    Forced sexual activity: Not on file  Other Topics Concern  . Not on file  Social History Narrative   Has living will   Wife would serve as health care POA--then son   Would accept resuscitation attempts but no prolonged artificial ventilation    No tube feeds if cognitively unaware   Review of Systems Appetite is fine Weight is stable Still doesn't sleep great---several times awakening. Rare nap Wears seat belt Teeth are fine--keeps up with dentist. Full dentures No current skin lesions---has had past cryotherapy Bowels fine--no blood. Occasional urgency    Objective:   Physical Exam  Constitutional: He is oriented to person, place, and time. He appears well-developed. No distress.  HENT:  Mouth/Throat: Oropharynx is clear and moist. No oropharyngeal exudate.  Neck: No thyromegaly present.  Cardiovascular: Normal rate, regular rhythm and intact distal pulses. Exam reveals no gallop.  No murmur heard. Respiratory: Effort normal and breath sounds normal. No respiratory distress. He has no wheezes. He has no rales.  GI: Soft. There is no abdominal tenderness.  Musculoskeletal:        General: No tenderness or edema.  Lymphadenopathy:    He has no cervical adenopathy.  Neurological: He is alert and oriented to person, place, and time.  President--- "Daisy Floro, Obama, Bush" [phone] [name] Recall 3/3  Skin: No rash noted. No erythema.  Psychiatric: He has a normal mood and affect.  His behavior is normal.           Assessment & Plan:

## 2018-08-30 NOTE — Assessment & Plan Note (Signed)
Stable DOE No change and on appropriate regimen (no beta blocker due to bradycardia and intolerance)

## 2018-08-30 NOTE — Assessment & Plan Note (Signed)
Okay on ranitidine

## 2018-08-30 NOTE — Assessment & Plan Note (Signed)
I have personally reviewed the Medicare Annual Wellness questionnaire and have noted 1. The patient's medical and social history 2. Their use of alcohol, tobacco or illicit drugs 3. Their current medications and supplements 4. The patient's functional ability including ADL's, fall risks, home safety risks and hearing or visual             impairment. 5. Diet and physical activities 6. Evidence for depression or mood disorders  The patients weight, height, BMI and visual acuity have been recorded in the chart I have made referrals, counseling and provided education to the patient based review of the above and I have provided the pt with a written personalized care plan for preventive services.  I have provided you with a copy of your personalized plan for preventive services. Please take the time to review along with your updated medication list.  Needs Td Yearly flu vaccine FIT again No PSA due to age 48 fit

## 2018-08-30 NOTE — Progress Notes (Signed)
Hearing Screening   Method: Audiometry   125Hz  250Hz  500Hz  1000Hz  2000Hz  3000Hz  4000Hz  6000Hz  8000Hz   Right ear:   20 20 20   40    Left ear:   20 20 20   0    Vision Screening Comments: December 2019

## 2018-08-30 NOTE — Assessment & Plan Note (Signed)
Mild No ongoing Rx

## 2018-09-04 ENCOUNTER — Telehealth: Payer: Self-pay

## 2018-09-04 NOTE — Telephone Encounter (Signed)
Pt wanted to know if he could drop off his fecal occult blood slides in boxes out front around 6 AM tomorrow; advised some one would be in lab tomorrow at 7:30 am but boxes out front are locked. Pt voiced understanding and will bring occult blood himself or have his wife drop off. nothing further needed.

## 2018-09-11 ENCOUNTER — Encounter (INDEPENDENT_AMBULATORY_CARE_PROVIDER_SITE_OTHER): Payer: PPO | Admitting: Ophthalmology

## 2018-09-11 DIAGNOSIS — H59031 Cystoid macular edema following cataract surgery, right eye: Secondary | ICD-10-CM

## 2018-09-11 DIAGNOSIS — H338 Other retinal detachments: Secondary | ICD-10-CM

## 2018-09-11 DIAGNOSIS — I10 Essential (primary) hypertension: Secondary | ICD-10-CM | POA: Diagnosis not present

## 2018-09-11 DIAGNOSIS — H33103 Unspecified retinoschisis, bilateral: Secondary | ICD-10-CM | POA: Diagnosis not present

## 2018-09-11 DIAGNOSIS — H43813 Vitreous degeneration, bilateral: Secondary | ICD-10-CM

## 2018-09-11 DIAGNOSIS — H35033 Hypertensive retinopathy, bilateral: Secondary | ICD-10-CM

## 2018-09-11 DIAGNOSIS — H35373 Puckering of macula, bilateral: Secondary | ICD-10-CM | POA: Diagnosis not present

## 2018-09-13 ENCOUNTER — Other Ambulatory Visit (INDEPENDENT_AMBULATORY_CARE_PROVIDER_SITE_OTHER): Payer: PPO

## 2018-09-13 DIAGNOSIS — Z1211 Encounter for screening for malignant neoplasm of colon: Secondary | ICD-10-CM | POA: Diagnosis not present

## 2018-09-13 LAB — FECAL OCCULT BLOOD, IMMUNOCHEMICAL: Fecal Occult Bld: NEGATIVE

## 2018-09-18 ENCOUNTER — Encounter: Payer: Self-pay | Admitting: Internal Medicine

## 2018-09-18 ENCOUNTER — Ambulatory Visit: Payer: PPO | Admitting: Family Medicine

## 2018-10-17 ENCOUNTER — Telehealth: Payer: Self-pay

## 2018-10-17 NOTE — Telephone Encounter (Signed)
Inbound call to triage - unable to take Zantac  Would like to know if PCP will prescribe an alternative medication or discontinue use of GERD medication completely.

## 2018-10-18 NOTE — Telephone Encounter (Signed)
Left detailed message on VM per DPR. 

## 2018-10-18 NOTE — Telephone Encounter (Signed)
I would recommend he change to famotidine 20mg  bid. This is also available over the counter. Can send Rx if he wants If he is not having any heartburn or swallowing problems, he could even try this just at bedtime

## 2018-11-04 ENCOUNTER — Other Ambulatory Visit: Payer: Self-pay | Admitting: Internal Medicine

## 2018-12-10 ENCOUNTER — Other Ambulatory Visit: Payer: Self-pay

## 2018-12-10 ENCOUNTER — Encounter (INDEPENDENT_AMBULATORY_CARE_PROVIDER_SITE_OTHER): Payer: PPO | Admitting: Ophthalmology

## 2018-12-10 DIAGNOSIS — H35373 Puckering of macula, bilateral: Secondary | ICD-10-CM

## 2018-12-10 DIAGNOSIS — H338 Other retinal detachments: Secondary | ICD-10-CM

## 2018-12-10 DIAGNOSIS — H33103 Unspecified retinoschisis, bilateral: Secondary | ICD-10-CM | POA: Diagnosis not present

## 2018-12-10 DIAGNOSIS — H43813 Vitreous degeneration, bilateral: Secondary | ICD-10-CM | POA: Diagnosis not present

## 2018-12-10 DIAGNOSIS — H59031 Cystoid macular edema following cataract surgery, right eye: Secondary | ICD-10-CM

## 2018-12-10 DIAGNOSIS — I10 Essential (primary) hypertension: Secondary | ICD-10-CM

## 2018-12-10 DIAGNOSIS — H35033 Hypertensive retinopathy, bilateral: Secondary | ICD-10-CM | POA: Diagnosis not present

## 2019-02-20 ENCOUNTER — Encounter: Payer: Self-pay | Admitting: Internal Medicine

## 2019-02-20 ENCOUNTER — Other Ambulatory Visit: Payer: Self-pay

## 2019-02-20 ENCOUNTER — Ambulatory Visit (INDEPENDENT_AMBULATORY_CARE_PROVIDER_SITE_OTHER): Payer: PPO | Admitting: Internal Medicine

## 2019-02-20 DIAGNOSIS — R1013 Epigastric pain: Secondary | ICD-10-CM | POA: Diagnosis not present

## 2019-02-20 MED ORDER — OMEPRAZOLE 20 MG PO CPDR: 20.0000 mg | DELAYED_RELEASE_CAPSULE | Freq: Every day | ORAL | 3 refills | Status: DC

## 2019-02-20 NOTE — Progress Notes (Signed)
Subjective:    Patient ID: Jonathan Griffith, male    DOB: [birth year], 78 y.o.   MRN: [id number]  HPI Here due to abdominal pain  "it hurts like crazy"---points to epigastrium Going on for 2 weeks Will occasionally radiate under both ribs Not clearly related to eating Appetite not there in the morning especially--last 2 weeks No nausea or vomiting  Notices it at night--will awaken at times Rare trouble swallowing ---tipping back drink (not food)  Was on zantac in the past---but stopped since it wasn't available  Current Outpatient Medications on File Prior to Visit  Medication Sig Dispense Refill  . aspirin 81 MG tablet Take 81 mg by mouth daily.      Marland Kitchen losartan (COZAAR) 25 MG tablet Take 2 tablets (50 mg total) by mouth daily. 180 tablet 3  . nitroGLYCERIN (NITROSTAT) 0.4 MG SL tablet Place 1 tablet (0.4 mg total) under the tongue every 5 (five) minutes as needed for chest pain (x 3 doses). 25 tablet 6  . PROLENSA 0.07 % SOLN INSTILL 1 DROP IN THE RIGHT EYE AT BEDTIME    . simvastatin (ZOCOR) 40 MG tablet TAKE ONE TABLET BY MOUTH EACH EVENING 90 tablet 3   No current facility-administered medications on file prior to visit.     Allergies  Allergen Reactions  . Ibuprofen     REACTION: ulcer    Past Medical History:  Diagnosis Date  . Allergic rhinitis   . CAD (coronary artery disease)   . Chest pain   . Duodenal ulcer   . ED (erectile dysfunction)   . GERD (gastroesophageal reflux disease)   . History of colonoscopy   . Hyperlipidemia   . Osteoarthritis     Past Surgical History:  Procedure Laterality Date  . APPENDECTOMY  1964  . CARDIAC CATHETERIZATION  03/2010   diffuse early disease  . CATARACT EXTRACTION Right 04/18/2016  . INGUINAL HERNIA REPAIR Bilateral 1970   double hernia repair   . LEFT HEART CATH AND CORONARY ANGIOGRAPHY N/A 10/12/2016   Procedure: Left Heart Cath and Coronary Angiography;  Surgeon: Burnell Blanks, MD;  Location: [facility];  Service: Cardiovascular;  Laterality: N/A;  . RETINAL DETACHMENT SURGERY Right 2003  . TONSILLECTOMY      Family History  Problem Relation Age of Onset  . Coronary artery disease Mother        CABG in 64's  . Brain cancer Mother        that was metastaticfrom lung   . Stomach cancer Father 89  . Diabetes Sister   . Stroke Maternal Grandmother     Social History   Socioeconomic History  . Marital status: Married    Spouse name: Not on file  . Number of children: 2  . Years of education: Not on file  . Highest education level: Not on file  Occupational History  . 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 Financial resource strain: Not on file  . Food insecurity    Worry: Not on file    Inability: Not on file  . Transportation needs    Medical: Not on file    Non-medical: Not on file  Tobacco Use  . Smoking status: Former Smoker    Packs/day: 1.00    Years: 10.00    Pack years: 10.00    Types: Cigars    Quit date: 07/18/2007    Years since quitting: 11.6  . Smokeless  tobacco: Never Used  . Tobacco comment: Occ cigar - no cigarettes - stopped in 6-09  Substance and Sexual Activity  . Alcohol use: Yes    Alcohol/week: 1.0 standard drinks    Types: 1 Standard drinks or equivalent per week    Comment: 3-4 drinks per week  . Drug use: No  . Sexual activity: Not on file  Lifestyle  . Physical activity    Days per week: Not on file    Minutes per session: Not on file  . Stress: Not on file  Relationships  . Social Herbalist on phone: Not on file    Gets together: Not on file    Attends religious service: Not on file    Active member of club or organization: Not on file    Attends meetings of clubs or organizations: Not on file    Relationship status: Not on file  . Intimate partner violence    Fear of current or ex partner: Not on file    Emotionally abused: Not on file    Physically abused: Not on file    Forced  sexual activity: Not on file  Other Topics Concern  . Not on file  Social History Narrative   Has living will   Wife would serve as health care POA--then son   Would accept resuscitation attempts but no prolonged artificial ventilation    No tube feeds if cognitively unaware   Review of Systems Has lost some weight Bowels "not real active"--[employer] coffee seemed to work best No blood in stool No fever    Objective:   Physical Exam  Constitutional: He appears well-developed. No distress.  Neck: No thyromegaly present.  Cardiovascular: Normal rate, regular rhythm and normal heart sounds. Exam reveals no gallop.  No murmur heard. Respiratory: Effort normal and breath sounds normal. No respiratory distress. He has no wheezes. He has no rales.  GI: Soft. He exhibits no distension. There is no rebound and no guarding.  Mild epigastric tenderness  Musculoskeletal:        General: No edema.  Lymphadenopathy:    He has no cervical adenopathy.           Assessment & Plan:

## 2019-02-20 NOTE — Assessment & Plan Note (Signed)
Seem likely to be acid related--since has come on since stopping zantac. Could be ulcer but likely still reflux Will start omeprazole daily GI evaluation if the pain persists

## 2019-02-20 NOTE — Patient Instructions (Signed)
If your pain is not better within 2 weeks, let me know and I will set you up with a GI doctor.

## 2019-02-27 DIAGNOSIS — L570 Actinic keratosis: Secondary | ICD-10-CM | POA: Diagnosis not present

## 2019-02-27 DIAGNOSIS — L821 Other seborrheic keratosis: Secondary | ICD-10-CM | POA: Diagnosis not present

## 2019-02-27 DIAGNOSIS — L814 Other melanin hyperpigmentation: Secondary | ICD-10-CM | POA: Diagnosis not present

## 2019-02-27 DIAGNOSIS — Z85828 Personal history of other malignant neoplasm of skin: Secondary | ICD-10-CM | POA: Diagnosis not present

## 2019-02-27 DIAGNOSIS — D1801 Hemangioma of skin and subcutaneous tissue: Secondary | ICD-10-CM | POA: Diagnosis not present

## 2019-03-12 ENCOUNTER — Other Ambulatory Visit: Payer: Self-pay

## 2019-03-12 ENCOUNTER — Encounter (INDEPENDENT_AMBULATORY_CARE_PROVIDER_SITE_OTHER): Payer: PPO | Admitting: Ophthalmology

## 2019-03-12 DIAGNOSIS — I10 Essential (primary) hypertension: Secondary | ICD-10-CM

## 2019-03-12 DIAGNOSIS — H33103 Unspecified retinoschisis, bilateral: Secondary | ICD-10-CM

## 2019-03-12 DIAGNOSIS — H35033 Hypertensive retinopathy, bilateral: Secondary | ICD-10-CM

## 2019-03-12 DIAGNOSIS — H43813 Vitreous degeneration, bilateral: Secondary | ICD-10-CM | POA: Diagnosis not present

## 2019-03-12 DIAGNOSIS — H59031 Cystoid macular edema following cataract surgery, right eye: Secondary | ICD-10-CM

## 2019-03-12 DIAGNOSIS — H35371 Puckering of macula, right eye: Secondary | ICD-10-CM

## 2019-03-31 ENCOUNTER — Telehealth: Payer: Self-pay

## 2019-03-31 NOTE — Telephone Encounter (Signed)
Pt requesting to speak with [facility] CMA; starting yesterday having pain in lt side, hip and groin. I advised pt that I was a nurse and that [facility] CMA and Dr Silvio Pate were seeing pts and I would be glad to help him but pt said he prefers to speak with Larene Beach and will wait for her cb.

## 2019-03-31 NOTE — Telephone Encounter (Signed)
Started with left back and hip on 03-28-19 in the evening. He did start back at the [facility] last week. Pain has gotten better, but not gone. Had to get up out of his chair. Yesterday, Left groin pain set in and he had trouble sleeping.  He took 2 Aleve each evening since Saturday and 2 this afternoon. Asking what else he can do. Does he need to be seen?

## 2019-03-31 NOTE — Telephone Encounter (Signed)
Spoke to pt. He will let us know if he needs to be seen.

## 2019-03-31 NOTE — Telephone Encounter (Signed)
[facility] He should take a couple of days off (and only walk) If worsening, I should see him later this week

## 2019-04-28 ENCOUNTER — Encounter: Payer: Self-pay | Admitting: Cardiovascular Disease

## 2019-04-28 ENCOUNTER — Other Ambulatory Visit: Payer: Self-pay

## 2019-04-28 ENCOUNTER — Encounter (INDEPENDENT_AMBULATORY_CARE_PROVIDER_SITE_OTHER): Payer: Self-pay

## 2019-04-28 ENCOUNTER — Ambulatory Visit: Payer: PPO | Admitting: Cardiovascular Disease

## 2019-04-28 VITALS — BP 150/74 | HR 57 | Ht 65.5 in | Wt 188.1 lb

## 2019-04-28 DIAGNOSIS — E7800 Pure hypercholesterolemia, unspecified: Secondary | ICD-10-CM | POA: Diagnosis not present

## 2019-04-28 DIAGNOSIS — I25119 Atherosclerotic heart disease of native coronary artery with unspecified angina pectoris: Secondary | ICD-10-CM

## 2019-04-28 DIAGNOSIS — R001 Bradycardia, unspecified: Secondary | ICD-10-CM | POA: Diagnosis not present

## 2019-04-28 DIAGNOSIS — I10 Essential (primary) hypertension: Secondary | ICD-10-CM | POA: Diagnosis not present

## 2019-04-28 NOTE — Patient Instructions (Signed)
Medication Instructions:  No changes If you need a refill on your cardiac medications before your next appointment, please call your pharmacy.   Lab work: none If you have labs (blood work) drawn today and your tests are completely normal, you will receive your results only by: . MyChart Message (if you have MyChart) OR . A paper copy in the mail If you have any lab test that is abnormal or we need to change your treatment, we will call you to review the results.  Testing/Procedures: none  Follow-Up: At [facility], you and your health needs are our priority.  As part of our continuing mission to provide you with exceptional heart care, we have created designated Provider Care Teams.  These Care Teams include your primary Cardiologist (physician) and Advanced Practice Providers (APPs -  Physician Assistants and Nurse Practitioners) who all work together to provide you with the care you need, when you need it. You will need a follow up appointment in 12 months.  Please call our office 2 months in advance to schedule this appointment.  You may see Christopher McAlhany, MD or one of the following Advanced Practice Providers on your designated Care Team:   Brittainy Simmons, PA-C Dayna Dunn, PA-C . Michele Lenze, PA-C  Any Other Special Instructions Will Be Listed Below (If Applicable).    

## 2019-04-28 NOTE — Progress Notes (Signed)
Chief Complaint  Patient presents with  . Follow-up    CAD   History of Present Illness: 78 yo male with history of CAD, hyperlipidemia and GERD who is here today for cardiac follow up. He is known to have mild to moderate CAD with last cath in March 2018 showing 40% ostial LAD stenosis and mild disease in the RCA and intermediate [place]. LV function was normal. He has not tolerated Imdur in the past. He had bradycardia with Bystolic so it was stopped. He has been seen in the Pulmonary clinic for workup of dyspnea and is not felt to have significant lung disease. He has described mild chest pain with heavy exertion for years. He has been on Ranexa for chronic angina, presumed to be microvascular.  He is here today for follow up. The patient denies any chest pain, dyspnea, palpitations, lower extremity edema, orthopnea, PND, dizziness, near syncope or syncope. He continues to have mild chest pressure at times with heavy exertion.    Primary Care Physician: Venia Carbon, MD  Past Medical History:  Diagnosis Date  . Allergic rhinitis   . CAD (coronary artery disease)   . Chest pain   . Duodenal ulcer   . ED (erectile dysfunction)   . GERD (gastroesophageal reflux disease)   . History of colonoscopy   . Hyperlipidemia   . Osteoarthritis     Past Surgical History:  Procedure Laterality Date  . APPENDECTOMY  1964  . CARDIAC CATHETERIZATION  03/2010   diffuse early disease  . CATARACT EXTRACTION Right 04/18/2016  . INGUINAL HERNIA REPAIR Bilateral 1970   double hernia repair   . LEFT HEART CATH AND CORONARY ANGIOGRAPHY N/A 10/12/2016   Procedure: Left Heart Cath and Coronary Angiography;  Surgeon: Burnell Blanks, MD;  Location: [place] [facility];  Service: Cardiovascular;  Laterality: N/A;  . RETINAL DETACHMENT SURGERY Right 2003  . TONSILLECTOMY      Current Outpatient Medications  Medication Sig Dispense Refill  . aspirin 81 MG tablet Take 81 mg by mouth daily.       . furosemide (LASIX) 40 MG tablet Take 40 mg by mouth as needed.    Marland Kitchen losartan (COZAAR) 25 MG tablet Take 2 tablets (50 mg total) by mouth daily. 180 tablet 3  . nitroGLYCERIN (NITROSTAT) 0.4 MG SL tablet Place 1 tablet (0.4 mg total) under the tongue every 5 (five) minutes as needed for chest pain (x 3 doses). 25 tablet 6  . omeprazole (PRILOSEC) 20 MG capsule Take 1 capsule (20 mg total) by mouth daily. On an empty stomach 90 capsule 3  . PROLENSA 0.07 % SOLN INSTILL 1 DROP IN THE RIGHT EYE AT BEDTIME    . simvastatin (ZOCOR) 40 MG tablet TAKE ONE TABLET BY MOUTH EACH EVENING 90 tablet 3   No current facility-administered medications for this visit.     Allergies  Allergen Reactions  . Ibuprofen     REACTION: ulcer    Social History   Socioeconomic History  . Marital status: Married    Spouse name: Not on file  . Number of children: 2  . Years of education: Not on file  . Highest education level: Not on file  Occupational History  . 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 Financial resource strain: Not on file  . Food insecurity    Worry: Not on file    Inability: Not on file  . Transportation needs  Medical: Not on file    Non-medical: Not on file  Tobacco Use  . Smoking status: Former Smoker    Packs/day: 1.00    Years: 10.00    Pack years: 10.00    Types: Cigars    Quit date: 07/18/2007    Years since quitting: 11.7  . Smokeless tobacco: Never Used  . Tobacco comment: Occ cigar - no cigarettes - stopped in 6-09  Substance and Sexual Activity  . Alcohol use: Yes    Alcohol/week: 1.0 standard drinks    Types: 1 Standard drinks or equivalent per week    Comment: 3-4 drinks per week  . Drug use: No  . Sexual activity: Not on file  Lifestyle  . Physical activity    Days per week: Not on file    Minutes per session: Not on file  . Stress: Not on file  Relationships  . Social Herbalist on phone: Not on file    Gets  together: Not on file    Attends religious service: Not on file    Active member of club or organization: Not on file    Attends meetings of clubs or organizations: Not on file    Relationship status: Not on file  . Intimate partner violence    Fear of current or ex partner: Not on file    Emotionally abused: Not on file    Physically abused: Not on file    Forced sexual activity: Not on file  Other Topics Concern  . Not on file  Social History Narrative   Has living will   Wife would serve as health care POA--then son   Would accept resuscitation attempts but no prolonged artificial ventilation    No tube feeds if cognitively unaware    Family History  Problem Relation Age of Onset  . Coronary artery disease Mother        CABG in 69's  . Brain cancer Mother        that was metastaticfrom lung   . Stomach cancer Father 10  . Diabetes Sister   . Stroke Maternal Grandmother     Review of Systems:  As stated in the HPI and otherwise negative.   BP (!) 150/74   Pulse (!) 57   Ht 5' 5.5" (1.664 m)   Wt 188 lb 1.9 oz (85.3 kg)   SpO2 98%   BMI 30.83 kg/m   Physical Examination:  General: Well developed, well nourished, NAD  HEENT: OP clear, mucus membranes moist  SKIN: warm, dry. No rashes. Neuro: No focal deficits  Musculoskeletal: Muscle strength 5/5 all ext  Psychiatric: Mood and affect normal  Neck: No JVD, no carotid bruits, no thyromegaly, no lymphadenopathy.  Lungs:Clear bilaterally, no wheezes, rhonci, crackles Cardiovascular: Regular rate and rhythm. No murmurs, gallops or rubs. Abdomen:Soft. Bowel sounds present. Non-tender.  Extremities: No lower extremity edema. Pulses are 2 + in the bilateral DP/PT.  EKG:  EKG is ordered today. The ekg ordered today demonstrates Sinus brady, rate 57 bpm. 1st degree AV block. Incomplete LBBB  Recent Labs: 08/30/2018: ALT 14; BUN 21; Creatinine, Ser 1.14; Hemoglobin 16.1; Platelets 258.0; Potassium 4.0; Sodium 140    Lipid Panel    Component Value Date/Time   CHOL 144 08/30/2018 0819   TRIG 140.0 08/30/2018 0819   HDL 47.60 08/30/2018 0819   CHOLHDL 3 08/30/2018 0819   VLDL 28.0 08/30/2018 0819   LDLCALC 69 08/30/2018 0819   LDLDIRECT 131.3 04/06/2009 [id number]  Wt Readings from Last 3 Encounters:  04/28/19 188 lb 1.9 oz (85.3 kg)  02/20/19 181 lb (82.1 kg)  08/30/18 188 lb (85.3 kg)     Other studies Reviewed: Additional studies/ records that were reviewed today include: . Review of the above records demonstrates:    Assessment and Plan:   1. CAD with stable angina:  No change in chronic chest pain. He has chronic angina, likely microvascular in origin. Cardiac cath in March 2018 with stable mild CAD. No beta blocker due to bradycardia in past while on beta blockers.  Continue ASA and statin. He never tried Ranexa. He did not tolerate Imdur.   2. HTN: BP is controlled at home. No changes.   3. Hyperlipidemia: Lipids followed in primary care. LDL 69 in February 2020. Continue statin  4. Sinus Bradycardia: No symptoms related to this.   Current medicines are reviewed at length with the patient today.  The patient does not have concerns regarding medicines.  The following changes have been made:  no change  Labs/ tests ordered today include:   No orders of the defined types were placed in this encounter.   Disposition:   FU with me in 12 months.    Signed, Lauree Chandler, MD 04/28/2019 3:29 PM    [facility] [place], [place], [place]  [address] Phone: [phone]; Fax: [phone]

## 2019-04-29 NOTE — Addendum Note (Signed)
Addended by: Mendel Ryder on: 04/29/2019 11:57 AM   Modules accepted: Orders

## 2019-05-13 ENCOUNTER — Other Ambulatory Visit: Payer: Self-pay | Admitting: Cardiovascular Disease

## 2019-05-15 ENCOUNTER — Other Ambulatory Visit: Payer: Self-pay | Admitting: Cardiovascular Disease

## 2019-05-15 MED ORDER — LOSARTAN POTASSIUM 50 MG PO TABS: 50.0000 mg | ORAL_TABLET | Freq: Every day | ORAL | 3 refills | Status: DC

## 2019-05-15 NOTE — Telephone Encounter (Signed)
Will change since patient will be taking the same dose of losartan. This will reduce the number of tablets patient has to take. He can go to taking one 50 mg tablet daily, instead of him having to taking 2 of the 25 mg tablets to equal 50 mg. This was changed back on 05/20/18 due to a shortage of Losartan 50 mg tablets.

## 2019-05-15 NOTE — Telephone Encounter (Signed)
We can write for Losartan 50 mg daily if that is the dose he has been taking. Thanks, chris

## 2019-05-15 NOTE — Telephone Encounter (Signed)
[facility] pharmacy stating that pt is requesting a prescription for Losartan 50 mg tablet, taking 1 tablet daily, instead of Losartan 25 mg tablet taking 2 tablets daily. Please address

## 2019-06-10 DIAGNOSIS — Z20828 Contact with and (suspected) exposure to other viral communicable diseases: Secondary | ICD-10-CM | POA: Diagnosis not present

## 2019-06-16 ENCOUNTER — Encounter (INDEPENDENT_AMBULATORY_CARE_PROVIDER_SITE_OTHER): Payer: PPO | Admitting: Ophthalmology

## 2019-06-16 DIAGNOSIS — H35033 Hypertensive retinopathy, bilateral: Secondary | ICD-10-CM

## 2019-06-16 DIAGNOSIS — I10 Essential (primary) hypertension: Secondary | ICD-10-CM

## 2019-06-16 DIAGNOSIS — H35371 Puckering of macula, right eye: Secondary | ICD-10-CM

## 2019-06-16 DIAGNOSIS — H43813 Vitreous degeneration, bilateral: Secondary | ICD-10-CM

## 2019-06-16 DIAGNOSIS — H59031 Cystoid macular edema following cataract surgery, right eye: Secondary | ICD-10-CM

## 2019-06-16 DIAGNOSIS — H338 Other retinal detachments: Secondary | ICD-10-CM | POA: Diagnosis not present

## 2019-08-05 ENCOUNTER — Other Ambulatory Visit: Payer: Self-pay | Admitting: Cardiovascular Disease

## 2019-08-06 MED ORDER — LOSARTAN POTASSIUM 50 MG PO TABS: 50.0000 mg | ORAL_TABLET | Freq: Every day | ORAL | 2 refills | Status: DC

## 2019-09-05 ENCOUNTER — Encounter: Payer: PPO | Admitting: Internal Medicine

## 2019-09-16 ENCOUNTER — Encounter (INDEPENDENT_AMBULATORY_CARE_PROVIDER_SITE_OTHER): Payer: PPO | Admitting: Ophthalmology

## 2019-09-16 DIAGNOSIS — H338 Other retinal detachments: Secondary | ICD-10-CM

## 2019-09-16 DIAGNOSIS — H59031 Cystoid macular edema following cataract surgery, right eye: Secondary | ICD-10-CM

## 2019-09-16 DIAGNOSIS — I10 Essential (primary) hypertension: Secondary | ICD-10-CM

## 2019-09-16 DIAGNOSIS — H35033 Hypertensive retinopathy, bilateral: Secondary | ICD-10-CM

## 2019-09-16 DIAGNOSIS — H33103 Unspecified retinoschisis, bilateral: Secondary | ICD-10-CM | POA: Diagnosis not present

## 2019-09-16 DIAGNOSIS — H43813 Vitreous degeneration, bilateral: Secondary | ICD-10-CM | POA: Diagnosis not present

## 2019-09-16 DIAGNOSIS — H35373 Puckering of macula, bilateral: Secondary | ICD-10-CM | POA: Diagnosis not present

## 2019-09-24 ENCOUNTER — Encounter: Payer: PPO | Admitting: Internal Medicine

## 2019-10-10 ENCOUNTER — Ambulatory Visit (INDEPENDENT_AMBULATORY_CARE_PROVIDER_SITE_OTHER): Payer: PPO | Admitting: Internal Medicine

## 2019-10-10 ENCOUNTER — Other Ambulatory Visit: Payer: Self-pay

## 2019-10-10 ENCOUNTER — Encounter: Payer: Self-pay | Admitting: Internal Medicine

## 2019-10-10 VITALS — BP 134/76 | HR 69 | Temp 97.8°F | Ht 65.25 in | Wt 181.0 lb

## 2019-10-10 DIAGNOSIS — M159 Polyosteoarthritis, unspecified: Secondary | ICD-10-CM

## 2019-10-10 DIAGNOSIS — Z0000 Encounter for general adult medical examination without abnormal findings: Secondary | ICD-10-CM

## 2019-10-10 DIAGNOSIS — Z7189 Other specified counseling: Secondary | ICD-10-CM | POA: Diagnosis not present

## 2019-10-10 DIAGNOSIS — I25119 Atherosclerotic heart disease of native coronary artery with unspecified angina pectoris: Secondary | ICD-10-CM

## 2019-10-10 DIAGNOSIS — M8949 Other hypertrophic osteoarthropathy, multiple sites: Secondary | ICD-10-CM | POA: Diagnosis not present

## 2019-10-10 DIAGNOSIS — K219 Gastro-esophageal reflux disease without esophagitis: Secondary | ICD-10-CM | POA: Diagnosis not present

## 2019-10-10 LAB — CBC
HCT: 43.8 % (ref 39.0–52.0)
Hemoglobin: 14.8 g/dL (ref 13.0–17.0)
MCHC: 33.7 g/dL (ref 30.0–36.0)
MCV: 87.7 fl (ref 78.0–100.0)
Platelets: 254 10*3/uL (ref 150.0–400.0)
RBC: 4.99 Mil/uL (ref 4.22–5.81)
RDW: 14.2 % (ref 11.5–15.5)
WBC: 6.6 10*3/uL (ref 4.0–10.5)

## 2019-10-10 LAB — COMPREHENSIVE METABOLIC PANEL
ALT: 12 U/L (ref 0–53)
AST: 19 U/L (ref 0–37)
Albumin: 4.2 g/dL (ref 3.5–5.2)
Alkaline Phosphatase: 97 U/L (ref 39–117)
BUN: 20 mg/dL (ref 6–23)
CO2: 28 mEq/L (ref 19–32)
Calcium: 9.4 mg/dL (ref 8.4–10.5)
Chloride: 101 mEq/L (ref 96–112)
Creatinine, Ser: 1.23 mg/dL (ref 0.40–1.50)
GFR: 56.6 mL/min — ABNORMAL LOW (ref >60.00)
Glucose, Bld: 110 mg/dL — ABNORMAL HIGH (ref 70–99)
Potassium: 4.4 mEq/L (ref 3.5–5.1)
Sodium: 137 mEq/L (ref 135–145)
Total Bilirubin: 0.7 mg/dL (ref 0.2–1.2)
Total Protein: 7.2 g/dL (ref 6.0–8.3)

## 2019-10-10 LAB — LIPID PANEL
Cholesterol: 144 mg/dL (ref 0–200)
HDL: 44.7 mg/dL (ref >39.00)
LDL Cholesterol: 79 mg/dL (ref 0–99)
NonHDL: 99.68
Total CHOL/HDL Ratio: 3
Triglycerides: 105 mg/dL (ref 0.0–149.0)
VLDL: 21 mg/dL (ref 0.0–40.0)

## 2019-10-10 NOTE — Assessment & Plan Note (Signed)
I have personally reviewed the Medicare Annual Wellness questionnaire and have noted 1. The patient's medical and social history 2. Their use of alcohol, tobacco or illicit drugs 3. Their current medications and supplements 4. The patient's functional ability including ADL's, fall risks, home safety risks and hearing or visual             impairment. 5. Diet and physical activities 6. Evidence for depression or mood disorders  The patients weight, height, BMI and visual acuity have been recorded in the chart I have made referrals, counseling and provided education to the patient based review of the above and I have provided the pt with a written personalized care plan for preventive services.  I have provided you with a copy of your personalized plan for preventive services. Please take the time to review along with your updated medication list.  Will stop cancer screening Stays fairly active Td if any injury Flu vaccine in the fall

## 2019-10-10 NOTE — Assessment & Plan Note (Signed)
Generally better Right knee shows no effusion. No ligament or meniscus findings. Discussed using brace, heat

## 2019-10-10 NOTE — Assessment & Plan Note (Signed)
Quiet now on the PPI Will continue

## 2019-10-10 NOTE — Progress Notes (Signed)
Subjective:    Patient ID: Jonathan Griffith, male    DOB: 1940-10-08, 79 y.o.   MRN: [id number]  HPI Here for Medicare wellness visit and follow up of chronic health conditions This visit occurred during the SARS-CoV-2 public health emergency.  Safety protocols were in place, including screening questions prior to the visit, additional usage of staff PPE, and extensive cleaning of exam room while observing appropriate contact time as indicated for disinfecting solutions.   Reviewed form and advanced directives Reviewed other doctors Still enjoys 1-2 bourbon daily No tobacco Vision is good---having some trouble with right eye though, is on some drops (glaucoma) Hearing is fine No falls No depression or anhedonia Independent with instrumental ADLs No memory problems  Did have his COVID vaccines [facility]  Having pain in right knee--seems to give way Some right thigh pain when walking as well--resolves with rest No swelling in knee This is the knee he hurt falling off the ladder--aleve does help Hasn't tried brace  Still working at [employer] 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 No syncope  Taking the PPI every day No symptoms with this No dysphagia  Current Outpatient Medications on File Prior to Visit  Medication Sig Dispense Refill  . aspirin 81 MG tablet Take 81 mg by mouth daily.      . furosemide (LASIX) 40 MG tablet Take 40 mg by mouth as needed.    Marland Kitchen losartan (COZAAR) 50 MG tablet Take 1 tablet (50 mg total) by mouth daily. 90 tablet 2  . nitroGLYCERIN (NITROSTAT) 0.4 MG SL tablet Place 1 tablet (0.4 mg total) under the tongue every 5 (five) minutes as needed for  chest pain (x 3 doses). 25 tablet 6  . omeprazole (PRILOSEC) 20 MG capsule Take 1 capsule (20 mg total) by mouth daily. On an empty stomach 90 capsule 3  . PROLENSA 0.07 % SOLN INSTILL 1 DROP IN THE RIGHT EYE AT BEDTIME    . simvastatin (ZOCOR) 40 MG tablet TAKE ONE TABLET BY MOUTH EACH EVENING 90 tablet 3   No current facility-administered medications on file prior to visit.    Allergies  Allergen Reactions  . Ibuprofen     REACTION: ulcer    Past Medical History:  Diagnosis Date  . Allergic rhinitis   . CAD (coronary artery disease)   . Chest pain   . Duodenal ulcer   . ED (erectile dysfunction)   . GERD (gastroesophageal reflux disease)   . History of colonoscopy   . Hyperlipidemia   . Osteoarthritis     Past Surgical History:  Procedure Laterality Date  . APPENDECTOMY  1964  . CARDIAC CATHETERIZATION  03/2010   diffuse early disease  . CATARACT EXTRACTION Right 04/18/2016  . INGUINAL HERNIA REPAIR Bilateral 1970   double hernia repair   . LEFT HEART CATH AND CORONARY ANGIOGRAPHY N/A 10/12/2016   Procedure: Left Heart Cath and Coronary Angiography;  Surgeon: Burnell Blanks, MD;  Location: [facility];  Service: Cardiovascular;  Laterality: N/A;  . RETINAL DETACHMENT SURGERY Right 2003  . TONSILLECTOMY      Family History  Problem Relation Age of Onset  . Coronary artery disease Mother  CABG in 60's  . Brain cancer Mother        that was metastaticfrom lung   . Stomach cancer Father 67  . Diabetes Sister   . Stroke Maternal Grandmother     Social History   Socioeconomic History  . Marital status: Married    Spouse name: Not on file  . Number of children: 2  . Years of education: Not on file  . Highest education level: Not on file  Occupational History  . Occupation: Administrator, Civil Service    Comment: retired  Tobacco Use  . Smoking status: Former Smoker    Packs/day: 1.00    Years: 10.00    Pack years: 10.00    Types: Cigars    Quit  date: 07/18/2007    Years since quitting: 12.2  . Smokeless tobacco: Never Used  . Tobacco comment: Occ cigar - no cigarettes - stopped in 6-09  Substance and Sexual Activity  . Alcohol use: Yes    Alcohol/week: 1.0 standard drinks    Types: 1 Standard drinks or equivalent per week    Comment: 3-4 drinks per week  . Drug use: No  . Sexual activity: Not on file  Other Topics Concern  . Not on file  Social History Narrative   Has living will   Wife would serve as health care POA--then son   Would accept resuscitation attempts but no prolonged artificial ventilation    No tube feeds if cognitively unaware   Social Determinants of Health   Financial Resource Strain:   . Difficulty of Paying Living Expenses:   Food Insecurity:   . Worried About Charity fundraiser in the Last Year:   . Arboriculturist in the Last Year:   Transportation Needs:   . Film/video editor (Medical):   Marland Kitchen Lack of Transportation (Non-Medical):   Physical Activity:   . Days of Exercise per Week:   . Minutes of Exercise per Session:   Stress:   . Feeling of Stress :   Social Connections:   . Frequency of Communication with Friends and Family:   . Frequency of Social Gatherings with Friends and Family:   . Attends Religious Services:   . Active Member of Clubs or Organizations:   . Attends Archivist Meetings:   Marland Kitchen Marital Status:   Intimate Partner Violence:   . Fear of Current or Ex-Partner:   . Emotionally Abused:   Marland Kitchen Physically Abused:   . Sexually Abused:    Review of Systems Appetite is fine---not hungry in the morning though Weight is down ~7#--this is desirable Still not a good sleeper--frequent awakening Nocturia x 2 --stable. No sig daytime issues Wears seat belt Teeth okay--full dentures No rash or suspicious lesions. Yearly derm visit Bowels are fine--no blood No other significant joint problems now    Objective:   Physical Exam  Constitutional: He is oriented to  person, place, and time. He appears well-developed. No distress.  HENT:  No oral lesions Full dentures  Neck: No thyromegaly present.  Cardiovascular: Normal rate, regular rhythm, normal heart sounds and intact distal pulses. Exam reveals no gallop.  No murmur heard. Respiratory: Breath sounds normal. No respiratory distress. He has no wheezes. He has no rales.  GI: Soft. There is no abdominal tenderness.  Diastasis recti  Musculoskeletal:        General: No tenderness or edema.  Lymphadenopathy:    He has no cervical adenopathy.  Neurological: He is alert  and oriented to person, place, and time.  President--- "Biden, Trump, Obama" [phone] D-[id number] Recall 3/3  Skin: No rash noted. No erythema.  Psychiatric: He has a normal mood and affect. His behavior is normal.           Assessment & Plan:

## 2019-10-10 NOTE — Assessment & Plan Note (Signed)
See social history 

## 2019-10-10 NOTE — Progress Notes (Signed)
Hearing Screening   Method: Audiometry   125Hz  250Hz  500Hz  1000Hz  2000Hz  3000Hz  4000Hz  6000Hz  8000Hz   Right ear:   20 20 20  20     Left ear:   20 20 20   0    Vision Screening Comments: March 2021

## 2019-10-10 NOTE — Assessment & Plan Note (Signed)
Still with stable DOE/chest pressure with exertion. Hasn't needed nitro On statin, asa, ARB

## 2019-11-04 ENCOUNTER — Other Ambulatory Visit: Payer: Self-pay | Admitting: Internal Medicine

## 2019-12-17 ENCOUNTER — Encounter (INDEPENDENT_AMBULATORY_CARE_PROVIDER_SITE_OTHER): Payer: PPO | Admitting: Ophthalmology

## 2019-12-17 ENCOUNTER — Other Ambulatory Visit: Payer: Self-pay

## 2019-12-17 ENCOUNTER — Telehealth: Payer: Self-pay | Admitting: *Deleted

## 2019-12-17 DIAGNOSIS — H338 Other retinal detachments: Secondary | ICD-10-CM | POA: Diagnosis not present

## 2019-12-17 DIAGNOSIS — H35033 Hypertensive retinopathy, bilateral: Secondary | ICD-10-CM | POA: Diagnosis not present

## 2019-12-17 DIAGNOSIS — H33103 Unspecified retinoschisis, bilateral: Secondary | ICD-10-CM | POA: Diagnosis not present

## 2019-12-17 DIAGNOSIS — H43813 Vitreous degeneration, bilateral: Secondary | ICD-10-CM

## 2019-12-17 DIAGNOSIS — H59031 Cystoid macular edema following cataract surgery, right eye: Secondary | ICD-10-CM | POA: Diagnosis not present

## 2019-12-17 DIAGNOSIS — H35371 Puckering of macula, right eye: Secondary | ICD-10-CM

## 2019-12-17 DIAGNOSIS — I10 Essential (primary) hypertension: Secondary | ICD-10-CM | POA: Diagnosis not present

## 2019-12-17 NOTE — Telephone Encounter (Signed)
Patient called to schedule an appointment and was transferred to triage because of symptoms. Patient stated that both of his calves hurt. Patient stated that it started out with right knee pain several months ago. Patient stated that when he sits in a chair any length of time when he stands up it takes a few minutes for him to start walking. Patient stated that he has been working a lot and has been very busy. Patient stated that he bent down yesterday and now his left knee and calf hurt. Patient stated that he may have pulled something in the back of his left leg. Patient denies any swelling, redness or heat in the back of his leg in his calf area. Patient scheduled for a office visit with Dr. Silvio Pate tomorrow 12/18/19 at 2:00 pm. Patient was advised to rest and elevate his leg today. Patient was given ER precautions and he verbalized understanding.

## 2019-12-17 NOTE — Telephone Encounter (Signed)
Okay I will check him tomorrow

## 2019-12-18 ENCOUNTER — Encounter: Payer: Self-pay | Admitting: Internal Medicine

## 2019-12-18 ENCOUNTER — Ambulatory Visit (INDEPENDENT_AMBULATORY_CARE_PROVIDER_SITE_OTHER): Payer: PPO | Admitting: Internal Medicine

## 2019-12-18 VITALS — BP 100/66 | HR 77 | Temp 98.2°F | Ht 65.25 in | Wt 179.0 lb

## 2019-12-18 DIAGNOSIS — M25562 Pain in left knee: Secondary | ICD-10-CM

## 2019-12-18 DIAGNOSIS — M25561 Pain in right knee: Secondary | ICD-10-CM | POA: Diagnosis not present

## 2019-12-18 DIAGNOSIS — M79662 Pain in left lower leg: Secondary | ICD-10-CM

## 2019-12-18 DIAGNOSIS — M79661 Pain in right lower leg: Secondary | ICD-10-CM | POA: Diagnosis not present

## 2019-12-18 NOTE — Assessment & Plan Note (Signed)
Much worse on right than left I suspect this is advanced osteoarthritis May benefit from injections or even have to consider TKR (since had been very active and now not able) Will refer to ortho

## 2019-12-18 NOTE — Progress Notes (Signed)
Subjective:    Patient ID: Jonathan Griffith, male    DOB: October 08, 1940, 79 y.o.   MRN: [id number]  HPI He is here due to leg pain This visit occurred during the SARS-CoV-2 public health emergency.  Safety protocols were in place, including screening questions prior to the visit, additional usage of staff PPE, and extensive cleaning of exam room while observing appropriate contact time as indicated for disinfecting solutions.   Has been having some right knee pain for some months Now having pain in both calves Has to stand still for a while before walking--because of the pain Right knee is especially bad  Bent down to check lawn mower 3 days ago--felt left knee pop Now that one is hurting also  No swelling Some right ankle pain also (past fracture)  Has tried aleve---doesn't last long but some help Pain when walking out in the yard--but doesn't get worse Some leg weakness also  Current Outpatient Medications on File Prior to Visit  Medication Sig Dispense Refill  . aspirin 81 MG tablet Take 81 mg by mouth daily.      . furosemide (LASIX) 40 MG tablet Take 40 mg by mouth as needed.    Marland Kitchen losartan (COZAAR) 50 MG tablet Take 1 tablet (50 mg total) by mouth daily. 90 tablet 2  . nitroGLYCERIN (NITROSTAT) 0.4 MG SL tablet Place 1 tablet (0.4 mg total) under the tongue every 5 (five) minutes as needed for chest pain (x 3 doses). 25 tablet 6  . omeprazole (PRILOSEC) 20 MG capsule Take 1 capsule (20 mg total) by mouth daily. On an empty stomach 90 capsule 3  . PROLENSA 0.07 % SOLN INSTILL 1 DROP IN THE RIGHT EYE AT BEDTIME    . simvastatin (ZOCOR) 40 MG tablet TAKE ONE TABLET BY MOUTH EACH EVENING 90 tablet 3   No current facility-administered medications on file prior to visit.    Allergies  Allergen Reactions  . Ibuprofen     REACTION: ulcer    Past Medical History:  Diagnosis Date  . Allergic rhinitis   . CAD (coronary artery disease)   . Chest pain   . Duodenal ulcer   . ED  (erectile dysfunction)   . GERD (gastroesophageal reflux disease)   . History of colonoscopy   . Hyperlipidemia   . Osteoarthritis     Past Surgical History:  Procedure Laterality Date  . APPENDECTOMY  1964  . CARDIAC CATHETERIZATION  03/2010   diffuse early disease  . CATARACT EXTRACTION Right 04/18/2016  . INGUINAL HERNIA REPAIR Bilateral 1970   double hernia repair   . LEFT HEART CATH AND CORONARY ANGIOGRAPHY N/A 10/12/2016   Procedure: Left Heart Cath and Coronary Angiography;  Surgeon: Burnell Blanks, MD;  Location: [facility];  Service: Cardiovascular;  Laterality: N/A;  . RETINAL DETACHMENT SURGERY Right 2003  . TONSILLECTOMY      Family History  Problem Relation Age of Onset  . Coronary artery disease Mother        CABG in 51's  . Brain cancer Mother        that was metastaticfrom lung   . Stomach cancer Father 84  . Diabetes Sister   . Stroke Maternal Grandmother     Social History   Socioeconomic History  . Marital status: Married    Spouse name: Not on file  . Number of children: 2  . Years of education: Not on file  . Highest education level: Not on file  Occupational History  . Occupation: Administrator, Civil Service    Comment: retired  Tobacco Use  . Smoking status: Former Smoker    Packs/day: 1.00    Years: 10.00    Pack years: 10.00    Types: Cigars    Quit date: 07/18/2007    Years since quitting: 12.4  . Smokeless tobacco: Never Used  . Tobacco comment: Occ cigar - no cigarettes - stopped in 6-09  Substance and Sexual Activity  . Alcohol use: Yes    Alcohol/week: 1.0 standard drinks    Types: 1 Standard drinks or equivalent per week    Comment: 3-4 drinks per week  . Drug use: No  . Sexual activity: Not on file  Other Topics Concern  . Not on file  Social History Narrative   Has living will   Wife would serve as health care POA--then son   Would accept resuscitation attempts but no prolonged artificial ventilation    No tube feeds  if cognitively unaware   Social Determinants of Health   Financial Resource Strain:   . Difficulty of Paying Living Expenses:   Food Insecurity:   . Worried About Charity fundraiser in the Last Year:   . Arboriculturist in the Last Year:   Transportation Needs:   . Film/video editor (Medical):   Marland Kitchen Lack of Transportation (Non-Medical):   Physical Activity:   . Days of Exercise per Week:   . Minutes of Exercise per Session:   Stress:   . Feeling of Stress :   Social Connections:   . Frequency of Communication with Friends and Family:   . Frequency of Social Gatherings with Friends and Family:   . Attends Religious Services:   . Active Member of Clubs or Organizations:   . Attends Archivist Meetings:   Marland Kitchen Marital Status:   Intimate Partner Violence:   . Fear of Current or Ex-Partner:   . Emotionally Abused:   Marland Kitchen Physically Abused:   . 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

## 2019-12-18 NOTE — Assessment & Plan Note (Addendum)
Nothing to suggest DVT Circulation seems fine---doesn't sound like claudication Pattern is not consistent with spinal stenosis I think it is related to the knee pain

## 2019-12-18 NOTE — Patient Instructions (Signed)
Please try diclofenac gel (over the counter) on your knee 3-4 times a day (and try to limit the aleve)

## 2019-12-22 ENCOUNTER — Other Ambulatory Visit: Payer: Self-pay

## 2019-12-22 ENCOUNTER — Ambulatory Visit (INDEPENDENT_AMBULATORY_CARE_PROVIDER_SITE_OTHER): Payer: PPO

## 2019-12-22 ENCOUNTER — Ambulatory Visit (INDEPENDENT_AMBULATORY_CARE_PROVIDER_SITE_OTHER): Payer: PPO | Admitting: Orthopaedic Surgery

## 2019-12-22 ENCOUNTER — Telehealth: Payer: Self-pay

## 2019-12-22 DIAGNOSIS — M25561 Pain in right knee: Secondary | ICD-10-CM

## 2019-12-22 DIAGNOSIS — M1711 Unilateral primary osteoarthritis, right knee: Secondary | ICD-10-CM

## 2019-12-22 MED ORDER — METHYLPREDNISOLONE ACETATE 40 MG/ML IJ SUSP
40.0000 mg | INTRAMUSCULAR | Status: AC | PRN
  Administered 2019-12-22: 40 mg via INTRA_ARTICULAR

## 2019-12-22 MED ORDER — METHYLPREDNISOLONE 4 MG PO TABS: ORAL_TABLET | ORAL | 0 refills | Status: DC

## 2019-12-22 MED ORDER — LIDOCAINE HCL 1 % IJ SOLN
3.0000 mL | INTRAMUSCULAR | Status: AC | PRN
Start: 2019-12-22 — End: 2019-12-22
  Administered 2019-12-22: 3 mL

## 2019-12-22 NOTE — Progress Notes (Signed)
Office Visit Note   Patient: Jonathan Griffith           Date of Birth: August 09, 1940           MRN: [id number] Visit Date: 12/22/2019              Requested by: Venia Carbon, MD [place],  [place] [address] PCP: Venia Carbon, MD   Assessment & Plan: Visit Diagnoses:  1. Right knee pain, unspecified chronicity   2. Unilateral primary osteoarthritis, right knee     Plan: Given his knee joint effusion and the severity of arthritis on x-rays combined with his pain I did recommend an aspiration of his knee and a steroid injection today.  I was able to aspirate about 50 cc of fluid from the knee.  It was more consistent with gout to me but he does have known osteoarthritis of the knee.  This did give him relief and I placed a steroid injection in his right knee.  He is certainly candidate for hyaluronic acid.  I would like to see him back in 4 weeks to hopefully place a gel shot in his knee at that visit.  Will aspirate again if needed.  I talked to him about the possibility of knee replacement in the future.  We want to try everything conservative first as to see.  All questions and concerns were answered and addressed.  Follow-Up Instructions: Return in about 4 weeks (around 01/19/2020).   Orders:  Orders Placed This Encounter  Procedures  . Large Joint Inj  . XR Knee 1-2 Views Right   Meds ordered this encounter  Medications  . methylPREDNISolone (MEDROL) 4 MG tablet    Sig: Medrol dose pack. Take as instructed    Dispense:  21 tablet    Refill:  0      Procedures: Large Joint Inj on 12/22/2019 11:23 AM Indications: diagnostic evaluation and pain Details: 22 G 1.5 in needle, superolateral approach  Arthrogram: No  Medications: 3 mL lidocaine 1 %; 40 mg methylPREDNISolone acetate 40 MG/ML Outcome: tolerated well, no immediate complications Procedure, treatment alternatives, risks and benefits explained, specific risks discussed. Consent was given by  the patient. Immediately prior to procedure a time out was called to verify the correct patient, procedure, equipment, support staff and site/side marked as required. Patient was prepped and draped in the usual sterile fashion.       Clinical Data: No additional findings.   Subjective: Chief Complaint  Patient presents with  . Right Knee - Pain  Patient is a very active and pleasant 79 year old gentleman who comes in with worsening right knee pain.  He is also developed right knee swelling.  He states that his first step to getting up are very stiff and painful and he is having a hard time getting around.  He denies any significant medical problems.  He is not a diabetic.  He does report a remote history of gout.  He has had no other acute changes in medical status.  He does not eat a lot of red meats and does not drink alcohol.  He has never had surgery on his knee before either.  HPI  Review of Systems He currently denies any headache, chest pain, shortness of breath, fever, chills, nausea, vomiting  Objective: Vital Signs: There were no vitals taken for this visit.  Physical Exam He is alert and orient x3 and in no acute distress Ortho Exam  Examination of both his knees in comparison show his right knee is swollen and warm.  He has painful range of motion of his right knee.  It is ligamentously stable.  His calf is soft. Specialty Comments:  No specialty comments available.  Imaging: XR Knee 1-2 Views Right  Result Date: 12/22/2019 2 views of the right knee show tricompartment arthritic changes with almost complete loss of the lateral joint line.  There is calcifications around both meniscus and para-articular osteophytes.    PMFS History: Patient Active Problem List   Diagnosis Date Noted  . Unilateral primary osteoarthritis, right knee 12/22/2019  . Bilateral calf pain 12/18/2019  . Bilateral knee pain 12/18/2019  . Epigastric pain 02/20/2019  . Sinus bradycardia  11/07/2016  . Sleep disorder 03/24/2016  . Advanced directives, counseling/discussion 05/13/2014  . GERD (gastroesophageal reflux disease)   . Routine general medical examination at a health care facility 05/05/2011  . Coronary artery disease with angina pectoris ([place]) 04/19/2010  . Osteoarthritis of more than one site 03/15/2007  . Hyperlipemia 03/04/2007  . ALLERGIC RHINITIS 03/04/2007   Past Medical History:  Diagnosis Date  . Allergic rhinitis   . CAD (coronary artery disease)   . Chest pain   . Duodenal ulcer   . ED (erectile dysfunction)   . GERD (gastroesophageal reflux disease)   . History of colonoscopy   . Hyperlipidemia   . Osteoarthritis     Family History  Problem Relation Age of Onset  . Coronary artery disease Mother        CABG in 51's  . Brain cancer Mother        that was metastaticfrom lung   . Stomach cancer Father 3  . Diabetes Sister   . Stroke Maternal Grandmother     Past Surgical History:  Procedure Laterality Date  . APPENDECTOMY  1964  . CARDIAC CATHETERIZATION  03/2010   diffuse early disease  . CATARACT EXTRACTION Right 04/18/2016  . INGUINAL HERNIA REPAIR Bilateral 1970   double hernia repair   . LEFT HEART CATH AND CORONARY ANGIOGRAPHY N/A 10/12/2016   Procedure: Left Heart Cath and Coronary Angiography;  Surgeon: Burnell Blanks, MD;  Location: [facility];  Service: Cardiovascular;  Laterality: N/A;  . RETINAL DETACHMENT SURGERY Right 2003  . TONSILLECTOMY     Social History   Occupational History  . Occupation: Administrator, Civil Service    Comment: retired  Tobacco Use  . Smoking status: Former Smoker    Packs/day: 1.00    Years: 10.00    Pack years: 10.00    Types: Cigars    Quit date: 07/18/2007    Years since quitting: 12.4  . Smokeless tobacco: Never Used  . Tobacco comment: Occ cigar - no cigarettes - stopped in 6-09  Substance and Sexual Activity  . Alcohol use: Yes    Alcohol/week: 1.0 standard drinks    Types:  1 Standard drinks or equivalent per week    Comment: 3-4 drinks per week  . Drug use: No  . Sexual activity: Not on file

## 2019-12-22 NOTE — Telephone Encounter (Signed)
Noted  

## 2019-12-22 NOTE — Telephone Encounter (Signed)
Right knee gel injection  

## 2019-12-23 ENCOUNTER — Telehealth: Payer: Self-pay

## 2019-12-23 NOTE — Telephone Encounter (Signed)
Submitted VOB for Monovisc, right knee. 

## 2019-12-25 ENCOUNTER — Telehealth: Payer: Self-pay

## 2019-12-25 NOTE — Telephone Encounter (Signed)
Approved, Monovisc, right knee. [place] Patient will be responsible for 20% OOP. Co-pay of $30.00 No PA required  Appt.01/20/2020

## 2020-01-20 ENCOUNTER — Encounter: Payer: Self-pay | Admitting: Orthopaedic Surgery

## 2020-01-20 ENCOUNTER — Other Ambulatory Visit: Payer: Self-pay

## 2020-01-20 ENCOUNTER — Ambulatory Visit: Payer: PPO | Admitting: Orthopaedic Surgery

## 2020-01-20 DIAGNOSIS — M1711 Unilateral primary osteoarthritis, right knee: Secondary | ICD-10-CM

## 2020-01-20 MED ORDER — HYALURONAN 88 MG/4ML IX SOSY
88.0000 mg | PREFILLED_SYRINGE | INTRA_ARTICULAR | Status: AC | PRN
  Administered 2020-01-20: 88 mg via INTRA_ARTICULAR

## 2020-01-20 NOTE — Progress Notes (Signed)
   Procedure Note  Patient: Jonathan Griffith             Date of Birth: 09/19/1940           MRN: [id number]             Visit Date: 01/20/2020 HPI: Jonathan Griffith returns today for Monovisc injection right knee.  He states the prior cortisone injection did give him some relief.  He still having a fair amount of stiffness in the knee.  Does have tricompartmental arthritis of the knee.    Physical exam: Right knee good range of motion.  No abnormal warmth erythema or effusion. Procedures: Visit Diagnoses:  1. Unilateral primary osteoarthritis, right knee     Large Joint Inj: R knee on 01/20/2020 9:01 AM Indications: pain Details: 22 G 1.5 in needle, anterolateral approach  Arthrogram: No  Medications: 88 mg Hyaluronan 88 MG/4ML Outcome: tolerated well, no immediate complications Procedure, treatment alternatives, risks and benefits explained, specific risks discussed. Consent was given by the patient. Immediately prior to procedure a time out was called to verify the correct patient, procedure, equipment, support staff and site/side marked as required. Patient was prepped and draped in the usual sterile fashion.    Plan he will follow up with [place] in a week to see what type of response he had to the Monovisc injection.  Questions were encouraged and answered.  He knows to wait at least 6 months between supplemental injections in 3 months between cortisone injections.

## 2020-02-10 ENCOUNTER — Other Ambulatory Visit: Payer: Self-pay | Admitting: Internal Medicine

## 2020-02-23 ENCOUNTER — Telehealth: Payer: Self-pay | Admitting: Orthopaedic Surgery

## 2020-02-23 NOTE — Telephone Encounter (Signed)
Pt called stating his knees have been aching and he would like to know if he should start wearing his brace again.  [phone]

## 2020-02-23 NOTE — Telephone Encounter (Signed)
It's ok to wear his knee brace if his knee is bothering him.

## 2020-02-23 NOTE — Telephone Encounter (Signed)
Will come here to get bilateral hinge knee brace

## 2020-02-25 ENCOUNTER — Encounter: Payer: Self-pay | Admitting: Internal Medicine

## 2020-02-25 DIAGNOSIS — H35373 Puckering of macula, bilateral: Secondary | ICD-10-CM | POA: Diagnosis not present

## 2020-02-25 DIAGNOSIS — H40051 Ocular hypertension, right eye: Secondary | ICD-10-CM | POA: Diagnosis not present

## 2020-02-25 DIAGNOSIS — H524 Presbyopia: Secondary | ICD-10-CM | POA: Diagnosis not present

## 2020-02-25 DIAGNOSIS — H35033 Hypertensive retinopathy, bilateral: Secondary | ICD-10-CM | POA: Diagnosis not present

## 2020-02-25 DIAGNOSIS — Z961 Presence of intraocular lens: Secondary | ICD-10-CM | POA: Diagnosis not present

## 2020-02-25 DIAGNOSIS — H35351 Cystoid macular degeneration, right eye: Secondary | ICD-10-CM | POA: Diagnosis not present

## 2020-03-02 ENCOUNTER — Ambulatory Visit: Payer: PPO | Admitting: Orthopaedic Surgery

## 2020-03-02 ENCOUNTER — Encounter: Payer: Self-pay | Admitting: Orthopaedic Surgery

## 2020-03-02 DIAGNOSIS — M1711 Unilateral primary osteoarthritis, right knee: Secondary | ICD-10-CM | POA: Diagnosis not present

## 2020-03-02 NOTE — Progress Notes (Signed)
HPI: Mr. Cwynar comes in today follow-up of his right knee underwent Monovisc injection 01/20/2020 cortisone injection in the knees 12/22/2019.  He has known tricompartmental arthritis in the knee.  States last week he was unable to walk until he got the knee brace his knee overall has good and bad days.  He is wondering what else can be done in regards to his knee.  Nuys any new injuries.  Denies any recent infections fevers chills.  Review of systems please see HPI otherwise negative.  Right knee: Full extension full flexion.  No instability valgus varus stressing.  He has slight patellofemoral crepitus with passive range of motion knee.  Tenderness along medial joint line.  Nontender of the lateral joint line on exam today.  No abnormal warmth erythema or effusion of the right knee.  Impression: Tricompartmental arthritis right knee  Plan: Discussed with the patient his options at this point time the next step would be a right total knee replacement.  He would like to give the injection a little more time to work.  He will work on Forensic scientist.  Questions encouraged and answered at length.  Follow-up with [place] as needed.  I did discuss with him postoperative protocol for total knee replacement and risk benefits of surgery.

## 2020-03-04 DIAGNOSIS — L821 Other seborrheic keratosis: Secondary | ICD-10-CM | POA: Diagnosis not present

## 2020-03-04 DIAGNOSIS — L578 Other skin changes due to chronic exposure to nonionizing radiation: Secondary | ICD-10-CM | POA: Diagnosis not present

## 2020-03-04 DIAGNOSIS — Z85828 Personal history of other malignant neoplasm of skin: Secondary | ICD-10-CM | POA: Diagnosis not present

## 2020-03-04 DIAGNOSIS — D1801 Hemangioma of skin and subcutaneous tissue: Secondary | ICD-10-CM | POA: Diagnosis not present

## 2020-03-04 DIAGNOSIS — L570 Actinic keratosis: Secondary | ICD-10-CM | POA: Diagnosis not present

## 2020-03-04 DIAGNOSIS — L814 Other melanin hyperpigmentation: Secondary | ICD-10-CM | POA: Diagnosis not present

## 2020-03-16 ENCOUNTER — Ambulatory Visit: Payer: PPO | Admitting: Orthopaedic Surgery

## 2020-03-18 ENCOUNTER — Encounter (INDEPENDENT_AMBULATORY_CARE_PROVIDER_SITE_OTHER): Payer: PPO | Admitting: Ophthalmology

## 2020-03-24 ENCOUNTER — Encounter (INDEPENDENT_AMBULATORY_CARE_PROVIDER_SITE_OTHER): Payer: PPO | Admitting: Ophthalmology

## 2020-03-24 ENCOUNTER — Other Ambulatory Visit: Payer: Self-pay

## 2020-03-24 DIAGNOSIS — H35373 Puckering of macula, bilateral: Secondary | ICD-10-CM

## 2020-03-24 DIAGNOSIS — H43813 Vitreous degeneration, bilateral: Secondary | ICD-10-CM | POA: Diagnosis not present

## 2020-03-24 DIAGNOSIS — H35033 Hypertensive retinopathy, bilateral: Secondary | ICD-10-CM | POA: Diagnosis not present

## 2020-03-24 DIAGNOSIS — H338 Other retinal detachments: Secondary | ICD-10-CM | POA: Diagnosis not present

## 2020-03-24 DIAGNOSIS — H59031 Cystoid macular edema following cataract surgery, right eye: Secondary | ICD-10-CM

## 2020-03-24 DIAGNOSIS — I10 Essential (primary) hypertension: Secondary | ICD-10-CM

## 2020-05-07 ENCOUNTER — Telehealth: Payer: Self-pay

## 2020-05-07 ENCOUNTER — Other Ambulatory Visit: Payer: Self-pay

## 2020-05-07 ENCOUNTER — Encounter: Payer: Self-pay | Admitting: Internal Medicine

## 2020-05-07 ENCOUNTER — Ambulatory Visit (INDEPENDENT_AMBULATORY_CARE_PROVIDER_SITE_OTHER): Payer: PPO | Admitting: Internal Medicine

## 2020-05-07 ENCOUNTER — Ambulatory Visit (INDEPENDENT_AMBULATORY_CARE_PROVIDER_SITE_OTHER)
Admission: RE | Admit: 2020-05-07 | Discharge: 2020-05-07 | Disposition: A | Discharge status: Home / Self Care | Payer: PPO | Source: Ambulatory Visit | Attending: Internal Medicine | Admitting: Internal Medicine

## 2020-05-07 VITALS — BP 112/70 | HR 47 | Temp 97.0°F | Ht 65.25 in | Wt 172.0 lb

## 2020-05-07 DIAGNOSIS — J984 Other disorders of lung: Secondary | ICD-10-CM | POA: Diagnosis not present

## 2020-05-07 DIAGNOSIS — Q740 Other congenital malformations of upper limb(s), including shoulder girdle: Secondary | ICD-10-CM | POA: Insufficient documentation

## 2020-05-07 DIAGNOSIS — Z23 Encounter for immunization: Secondary | ICD-10-CM | POA: Diagnosis not present

## 2020-05-07 DIAGNOSIS — M19012 Primary osteoarthritis, left shoulder: Secondary | ICD-10-CM | POA: Diagnosis not present

## 2020-05-07 DIAGNOSIS — M19011 Primary osteoarthritis, right shoulder: Secondary | ICD-10-CM | POA: Diagnosis not present

## 2020-05-07 NOTE — Telephone Encounter (Signed)
[facility] - Client Nonclinical Telephone Record AccessNurse Client [facility] - Client Client Site [facility] Physician Viviana Simpler- MD Contact Type Call Who Is Calling Patient / Member / Family / Caregiver Caller Name [facility] Phone Number [phone] Patient Name Jonathan Griffith Patient DOB 1941-05-05 Call Type Message Only Information Provided Reason for Call Request to Schedule Office Appointment Initial Comment Caller states he has a knot on his collarbone. He wants to make an appointment. Additional Comment Declined triage. Advised caller to call back. Hours provided. Disp. Time Disposition Final User 05/07/2020 7:58:50 AM General Information Provided Yes Phillips Hay Call Closed By: Phillips Hay Transaction Date/Time: 05/07/2020 7:56:22 AM (ET)

## 2020-05-07 NOTE — Telephone Encounter (Signed)
Pt already has appt today with Dr Silvio Pate at 12:45.

## 2020-05-07 NOTE — Assessment & Plan Note (Addendum)
Appears to be displaced from sternum No tenderness Will just check CXR to be sure nothing worrisome  CXR looks normal--will await overread Reassured him though

## 2020-05-07 NOTE — Telephone Encounter (Signed)
See OV note.  

## 2020-05-07 NOTE — Progress Notes (Signed)
Subjective:    Patient ID: Jonathan Griffith, male    DOB: August 14, 1940, 79 y.o.   MRN: [id number]  HPI Here due to lump at top of chest This visit occurred during the SARS-CoV-2 public health emergency.  Safety protocols were in place, including screening questions prior to the visit, additional usage of staff PPE, and extensive cleaning of exam room while observing appropriate contact time as indicated for disinfecting solutions.   Wife noted knot at clavicle on right Earlier this week He has no pain and didn't notice it  Current Outpatient Medications on File Prior to Visit  Medication Sig Dispense Refill  . ALPHAGAN P 0.1 % SOLN Place 1 drop into the right eye 2 (two) times daily.    Marland Kitchen aspirin 81 MG tablet Take 81 mg by mouth daily.      . furosemide (LASIX) 40 MG tablet Take 40 mg by mouth as needed.    . ILEVRO 0.3 % ophthalmic suspension INSTILL 1 DROP IN THE RIGHT EYE AT BED TIME    . losartan (COZAAR) 50 MG tablet Take 1 tablet (50 mg total) by mouth daily. 90 tablet 2  . nitroGLYCERIN (NITROSTAT) 0.4 MG SL tablet Place 1 tablet (0.4 mg total) under the tongue every 5 (five) minutes as needed for chest pain (x 3 doses). 25 tablet 6  . omeprazole (PRILOSEC) 20 MG capsule TAKE 1 CAPSULE (20 MG TOTAL) BY MOUTH DAILY. ON AN EMPTY STOMACH 90 capsule 3  . prednisoLONE acetate (PRED FORTE) 1 % ophthalmic suspension Place 1 drop into the right eye 3 (three) times daily.    . simvastatin (ZOCOR) 40 MG tablet TAKE ONE TABLET BY MOUTH EACH EVENING 90 tablet 3   No current facility-administered medications on file prior to visit.    Allergies  Allergen Reactions  . Ibuprofen     REACTION: ulcer    Past Medical History:  Diagnosis Date  . Allergic rhinitis   . CAD (coronary artery disease)   . Chest pain   . Duodenal ulcer   . ED (erectile dysfunction)   . GERD (gastroesophageal reflux disease)   . History of colonoscopy   . Hyperlipidemia   . Osteoarthritis     Past Surgical  History:  Procedure Laterality Date  . APPENDECTOMY  1964  . CARDIAC CATHETERIZATION  03/2010   diffuse early disease  . CATARACT EXTRACTION Right 04/18/2016  . INGUINAL HERNIA REPAIR Bilateral 1970   double hernia repair   . LEFT HEART CATH AND CORONARY ANGIOGRAPHY N/A 10/12/2016   Procedure: Left Heart Cath and Coronary Angiography;  Surgeon: Burnell Blanks, MD;  Location: [facility];  Service: Cardiovascular;  Laterality: N/A;  . RETINAL DETACHMENT SURGERY Right 2003  . TONSILLECTOMY      Family History  Problem Relation Age of Onset  . Coronary artery disease Mother        CABG in 52's  . Brain cancer Mother        that was metastaticfrom lung   . Stomach cancer Father 76  . Diabetes Sister   . Stroke Maternal Grandmother     Social History   Socioeconomic History  . Marital status: Married    Spouse name: Not on file  . Number of children: 2  . Years of education: Not on file  . Highest education level: Not on file  Occupational History  . Occupation: Administrator, Civil Service    Comment: retired  Tobacco Use  . Smoking status: Former  Smoker    Packs/day: 1.00    Years: 10.00    Pack years: 10.00    Types: Cigars    Quit date: 07/18/2007    Years since quitting: 12.8  . Smokeless tobacco: Never Used  . Tobacco comment: Occ cigar - no cigarettes - stopped in 6-09  Vaping Use  . Vaping Use: Never used  Substance and Sexual Activity  . Alcohol use: Yes    Alcohol/week: 1.0 standard drink    Types: 1 Standard drinks or equivalent per week    Comment: 3-4 drinks per week  . Drug use: No  . Sexual activity: Not on file  Other Topics Concern  . Not on file  Social History Narrative   Has living will   Wife would serve as health care POA--then son   Would accept resuscitation attempts but no prolonged artificial ventilation    No tube feeds if cognitively unaware   Social Determinants of Health   Financial Resource Strain:   . Difficulty of Paying  Living Expenses: Not on file  Food Insecurity:   . Worried About Charity fundraiser in the Last Year: Not on file  . Ran Out of Food in the Last Year: Not on file  Transportation Needs:   . Lack of Transportation (Medical): Not on file  . Lack of Transportation (Non-Medical): Not on file  Physical Activity:   . Days of Exercise per Week: Not on file  . Minutes of Exercise per Session: Not on file  Stress:   . Feeling of Stress : Not on file  Social Connections:   . Frequency of Communication with Friends and Family: Not on file  . Frequency of Social Gatherings with Friends and Family: Not on file  . Attends Religious Services: Not on file  . Active Member of Clubs or Organizations: Not on file  . Attends Archivist Meetings: Not on file  . Marital Status: Not on file  Intimate Partner Violence:   . Fear of Current or Ex-Partner: Not on file  . Emotionally Abused: Not on file  . Physically Abused: Not on file  . Sexually Abused: Not on file   Review of Systems No cough or SOB Appetite is down---7# in the past 4 months No dysphagia    Objective:   Physical Exam Constitutional:      Appearance: Normal appearance.  Cardiovascular:     Rate and Rhythm: Normal rate and regular rhythm.     Heart sounds: No murmur heard.  No gallop.   Pulmonary:     Effort: Pulmonary effort is normal.     Breath sounds: Normal breath sounds. No wheezing or rales.     Comments: Right clavicular head is displaced anteriorly Not tender Neurological:     Mental Status: He is alert.            Assessment & Plan:

## 2020-06-14 ENCOUNTER — Ambulatory Visit: Payer: PPO | Admitting: Cardiovascular Disease

## 2020-06-14 ENCOUNTER — Encounter: Payer: Self-pay | Admitting: Cardiovascular Disease

## 2020-06-14 ENCOUNTER — Other Ambulatory Visit: Payer: Self-pay

## 2020-06-14 VITALS — BP 148/68 | HR 46 | Ht 67.0 in | Wt 175.4 lb

## 2020-06-14 DIAGNOSIS — Z01810 Encounter for preprocedural cardiovascular examination: Secondary | ICD-10-CM | POA: Diagnosis not present

## 2020-06-14 DIAGNOSIS — E7800 Pure hypercholesterolemia, unspecified: Secondary | ICD-10-CM | POA: Diagnosis not present

## 2020-06-14 DIAGNOSIS — R001 Bradycardia, unspecified: Secondary | ICD-10-CM

## 2020-06-14 DIAGNOSIS — I25119 Atherosclerotic heart disease of native coronary artery with unspecified angina pectoris: Secondary | ICD-10-CM

## 2020-06-14 DIAGNOSIS — I10 Essential (primary) hypertension: Secondary | ICD-10-CM | POA: Diagnosis not present

## 2020-06-14 MED ORDER — NITROGLYCERIN 0.4 MG SL SUBL: 0.4000 mg | SUBLINGUAL_TABLET | SUBLINGUAL | 6 refills | Status: DC | PRN

## 2020-06-14 NOTE — Progress Notes (Signed)
Chief Complaint  Patient presents with  . Follow-up    CAD   History of Present Illness: 79 yo male with history of CAD, hyperlipidemia and GERD who is here today for cardiac follow up. He is known to have mild to moderate CAD with last cath in March 2018 showing 40% ostial LAD stenosis and mild disease in the RCA and intermediate [place]. LV function was normal. He has not tolerated Imdur in the past. He had bradycardia with Bystolic so it was stopped. He has been seen in the Pulmonary clinic for workup of dyspnea and is not felt to have significant lung disease. He has described mild chest pain with heavy exertion for years. He did not tolerate Imdur and has not been willing to try Ranexa.   He is here today for follow up. The patient denies any chest pain, dyspnea, palpitations, lower extremity edema, orthopnea, PND, dizziness, near syncope or syncope. His chest and arm hurt after his booster shot recently.     Primary Care Physician: Venia Carbon, MD  Past Medical History:  Diagnosis Date  . Allergic rhinitis   . CAD (coronary artery disease)   . Chest pain   . Duodenal ulcer   . ED (erectile dysfunction)   . GERD (gastroesophageal reflux disease)   . History of colonoscopy   . Hyperlipidemia   . Osteoarthritis     Past Surgical History:  Procedure Laterality Date  . APPENDECTOMY  1964  . CARDIAC CATHETERIZATION  03/2010   diffuse early disease  . CATARACT EXTRACTION Right 04/18/2016  . INGUINAL HERNIA REPAIR Bilateral 1970   double hernia repair   . LEFT HEART CATH AND CORONARY ANGIOGRAPHY N/A 10/12/2016   Procedure: Left Heart Cath and Coronary Angiography;  Surgeon: Burnell Blanks, MD;  Location: [facility];  Service: Cardiovascular;  Laterality: N/A;  . RETINAL DETACHMENT SURGERY Right 2003  . TONSILLECTOMY      Current Outpatient Medications  Medication Sig Dispense Refill  . ALPHAGAN P 0.1 % SOLN Place 1 drop into the right eye 2 (two)  times daily.    Marland Kitchen aspirin 81 MG tablet Take 81 mg by mouth daily.      . furosemide (LASIX) 40 MG tablet Take 40 mg by mouth daily.     . ILEVRO 0.3 % ophthalmic suspension INSTILL 1 DROP IN THE RIGHT EYE AT BED TIME    . losartan (COZAAR) 50 MG tablet Take 1 tablet (50 mg total) by mouth daily. 90 tablet 2  . nitroGLYCERIN (NITROSTAT) 0.4 MG SL tablet Place 1 tablet (0.4 mg total) under the tongue every 5 (five) minutes as needed for chest pain (x 3 doses). 25 tablet 6  . omeprazole (PRILOSEC) 20 MG capsule TAKE 1 CAPSULE (20 MG TOTAL) BY MOUTH DAILY. ON AN EMPTY STOMACH 90 capsule 3  . prednisoLONE acetate (PRED FORTE) 1 % ophthalmic suspension Place 1 drop into the right eye 3 (three) times daily.    . simvastatin (ZOCOR) 40 MG tablet TAKE ONE TABLET BY MOUTH EACH EVENING 90 tablet 3   No current facility-administered medications for this visit.    Allergies  Allergen Reactions  . Ibuprofen     REACTION: ulcer    Social History   Socioeconomic History  . Marital status: Married    Spouse name: Not on file  . Number of children: 2  . Years of education: Not on file  . Highest education level: Not on file  Occupational History  . Occupation: Administrator, [employer] Service    Comment: retired  Tobacco Use  . Smoking status: Former Smoker    Packs/day: 1.00    Years: 10.00    Pack years: 10.00    Types: Cigars    Quit date: 07/18/2007    Years since quitting: 12.9  . Smokeless tobacco: Never Used  . Tobacco comment: Occ cigar - no cigarettes - stopped in 6-09  Vaping Use  . Vaping Use: Never used  Substance and Sexual Activity  . Alcohol use: Yes    Alcohol/week: 1.0 standard drink    Types: 1 Standard drinks or equivalent per week    Comment: 3-4 drinks per week  . Drug use: No  . Sexual activity: Not on file  Other Topics Concern  . Not on file  Social History Narrative   Has living will   Wife would serve as health care POA--then son   Would accept resuscitation attempts but no  prolonged artificial ventilation    No tube feeds if cognitively unaware   Social Determinants of Health   Financial Resource Strain:   . Difficulty of Paying Living Expenses: Not on file  Food Insecurity:   . Worried About Charity fundraiser in the Last Year: Not on file  . Ran Out of Food in the Last Year: Not on file  Transportation Needs:   . Lack of Transportation (Medical): Not on file  . Lack of Transportation (Non-Medical): Not on file  Physical Activity:   . Days of Exercise per Week: Not on file  . Minutes of Exercise per Session: Not on file  Stress:   . Feeling of Stress : Not on file  Social Connections:   . Frequency of Communication with Friends and Family: Not on file  . Frequency of Social Gatherings with Friends and Family: Not on file  . Attends Religious Services: Not on file  . Active Member of Clubs or Organizations: Not on file  . Attends Archivist Meetings: Not on file  . Marital Status: Not on file  Intimate Partner Violence:   . Fear of Current or Ex-Partner: Not on file  . Emotionally Abused: Not on file  . Physically Abused: Not on file  . Sexually Abused: Not on file    Family History  Problem Relation Age of Onset  . Coronary artery disease Mother        CABG in 69's  . Brain cancer Mother        that was metastaticfrom lung   . Stomach cancer Father 50  . Diabetes Sister   . Stroke Maternal Grandmother     Review of Systems:  As stated in the HPI and otherwise negative.   BP (!) 148/68   Pulse (!) 46   Ht 5\' 7"  (1.702 m)   Wt 175 lb 7.2 oz (79.6 kg)   SpO2 98%   BMI 27.48 kg/m   Physical Examination:  General: Well developed, well nourished, NAD  HEENT: OP clear, mucus membranes moist  SKIN: warm, dry. No rashes. Neuro: No focal deficits  Musculoskeletal: Muscle strength 5/5 all ext  Psychiatric: Mood and affect normal  Neck: No JVD, no carotid bruits, no thyromegaly, no lymphadenopathy.  Lungs:Clear  bilaterally, no wheezes, rhonci, crackles Cardiovascular: Regular rate and rhythm. No murmurs, gallops or rubs. Abdomen:Soft. Bowel sounds present. Non-tender.  Extremities: No lower extremity edema. Pulses are 2 + in the bilateral DP/PT.  EKG:  EKG is ordered today.  The ekg ordered today demonstrates sinus bradycardia, rate 46 bpm. 1st degree AV block.   Recent Labs: 10/10/2019: ALT 12; BUN 20; Creatinine, Ser 1.23; Hemoglobin 14.8; Platelets 254.0; Potassium 4.4; Sodium 137   Lipid Panel    Component Value Date/Time   CHOL 144 10/10/2019 0804   TRIG 105.0 10/10/2019 0804   HDL 44.70 10/10/2019 0804   CHOLHDL 3 10/10/2019 0804   VLDL 21.0 10/10/2019 0804   LDLCALC 79 10/10/2019 0804   LDLDIRECT 131.3 04/06/2009 0922     Wt Readings from Last 3 Encounters:  06/14/20 175 lb 7.2 oz (79.6 kg)  05/07/20 172 lb (78 kg)  12/18/19 179 lb (81.2 kg)     Other studies Reviewed: Additional studies/ records that were reviewed today include: . Review of the above records demonstrates:    Assessment and Plan:   1. CAD with stable angina:  No exertional chest pain. He has chronic angina, likely microvascular in origin. Cardiac cath in March 2018 with stable mild CAD. No beta blocker due to bradycardia in past while on beta blockers.  He never tried Ranexa. He did not tolerate Imdur. Continue ASA and statin  2. HTN: BP was well controlled his primary care visit last month. Continue current therapy  3. Hyperlipidemia: Lipids followed in primary care. LDL near goal. Continue statin  4. Sinus Bradycardia: No symptoms related to this.   5. Pre operative cardiovascular examination: He has no exertional pain suggestive of angina. No signs or symptoms of CHF. He can proceed with his planned knee replacement without any further cardiac workup.   Current medicines are reviewed at length with the patient today.  The patient does not have concerns regarding medicines.  The following changes have  been made:  no change  Labs/ tests ordered today include:   Orders Placed This Encounter  Procedures  . EKG 12-Lead    Disposition:   FU with me in 12 months.    Signed, Lauree Chandler, MD 06/14/2020 8:36 AM    [facility] [address], [place], [place]  [address] Phone: [phone]; Fax: [phone]

## 2020-06-16 ENCOUNTER — Telehealth: Payer: Self-pay | Admitting: Orthopaedic Surgery

## 2020-06-16 NOTE — Telephone Encounter (Signed)
Jonathan Griffith patient - please advise how to proceed.  Thanks!

## 2020-06-16 NOTE — Telephone Encounter (Signed)
Pt called and would like to go ahead and schedule surgery with Dr.Dean

## 2020-06-17 NOTE — Telephone Encounter (Signed)
I will give you a surgery sheet for a right total knee

## 2020-06-21 NOTE — Telephone Encounter (Signed)
Spoke with patient and advised would call him back to schedule.

## 2020-07-26 ENCOUNTER — Encounter (INDEPENDENT_AMBULATORY_CARE_PROVIDER_SITE_OTHER): Payer: PPO | Admitting: Ophthalmology

## 2020-07-26 ENCOUNTER — Other Ambulatory Visit: Payer: Self-pay

## 2020-07-26 DIAGNOSIS — I10 Essential (primary) hypertension: Secondary | ICD-10-CM

## 2020-07-26 DIAGNOSIS — H338 Other retinal detachments: Secondary | ICD-10-CM | POA: Diagnosis not present

## 2020-07-26 DIAGNOSIS — H59033 Cystoid macular edema following cataract surgery, bilateral: Secondary | ICD-10-CM

## 2020-07-26 DIAGNOSIS — H43813 Vitreous degeneration, bilateral: Secondary | ICD-10-CM

## 2020-07-26 DIAGNOSIS — H35033 Hypertensive retinopathy, bilateral: Secondary | ICD-10-CM

## 2020-07-30 ENCOUNTER — Other Ambulatory Visit: Payer: Self-pay | Admitting: Cardiovascular Disease

## 2020-08-31 NOTE — Progress Notes (Signed)
Pt. Needs orders for upcomming surgery.PAT and labs on 09/01/20.Thanks.

## 2020-08-31 NOTE — Patient Instructions (Addendum)
DUE TO COVID-19 ONLY ONE VISITOR IS ALLOWED TO COME WITH YOU AND STAY IN THE WAITING ROOM ONLY DURING PRE OP AND PROCEDURE DAY OF SURGERY. THE 1 VISITOR  MAY VISIT WITH YOU AFTER SURGERY IN YOUR PRIVATE ROOM DURING VISITING HOURS ONLY!  YOU NEED TO HAVE A COVID 19 TEST ON: 09/07/20 @ 9:00 AM , THIS TEST MUST BE DONE BEFORE SURGERY,  COVID TESTING SITE [address] [place] [address], IT IS ON THE RIGHT GOING OUT [address] APPROXIMATELY  2 MINUTES PAST [employer] ON THE RIGHT. ONCE YOUR COVID TEST IS COMPLETED,  PLEASE BEGIN THE QUARANTINE INSTRUCTIONS AS OUTLINED IN YOUR HANDOUT.                Jonathan Griffith    Your procedure is scheduled on: 09/10/20   Report to [facility] Main  Entrance   Report to admitting at: 8:30 AM     Call this number if you have problems the morning of surgery [phone]    Remember: Do not eat solid food :After Midnight. Clear liquids until: 8:00 am.  CLEAR LIQUID DIET  Foods Allowed                                                                     Foods Excluded  Coffee and tea, regular and decaf                             liquids that you cannot  Plain Jell-O any favor except red or purple                                           see through such as: Fruit ices (not with fruit pulp)                                     milk, soups, orange juice  Iced Popsicles                                    All solid food Carbonated beverages, regular and diet                                    Cranberry, grape and apple juices Sports drinks like Gatorade Lightly seasoned clear broth or consume(fat free) Sugar, honey syrup  Sample Menu Breakfast                                Lunch                                     Supper Cranberry juice  Beef broth                            Chicken broth Jell-O                                     Grape juice                           Apple juice Coffee or tea                         Jell-O                                      Popsicle                                                Coffee or tea                        Coffee or tea  _____________________________________________________________________  BRUSH YOUR TEETH MORNING OF SURGERY AND RINSE YOUR MOUTH OUT, NO CHEWING GUM CANDY OR MINTS.    Use eye drops as usual.                               You may not have any metal on your body including hair pins and              piercings  Do not wear jewelry, lotions, powders or perfumes, deodorant             Men may shave face and neck.   Do not bring valuables to the hospital. [place].  Contacts, dentures or bridgework may not be worn into surgery.  Leave suitcase in the car. After surgery it may be brought to your room.     Patients discharged the day of surgery will not be allowed to drive home. IF YOU ARE HAVING SURGERY AND GOING HOME THE SAME DAY, YOU MUST HAVE AN ADULT TO DRIVE YOU HOME AND BE WITH YOU FOR 24 HOURS. YOU MAY GO HOME BY TAXI OR UBER OR ORTHERWISE, BUT AN ADULT MUST ACCOMPANY YOU HOME AND STAY WITH YOU FOR 24 HOURS.  Name and phone number of your driver:  Special Instructions: N/A              Please read over the following fact sheets you were given: _____________________________________________________________________          [facility] - Preparing for Surgery Before surgery, you can play an important role.  Because skin is not sterile, your skin needs to be as free of germs as possible.  You can reduce the number of germs on your skin by washing with CHG (chlorahexidine gluconate) soap before surgery.  CHG is an antiseptic cleaner which kills germs and bonds with the skin to continue killing germs even after washing. Please DO NOT use if you  have an allergy to CHG or antibacterial soaps.  If your skin becomes reddened/irritated stop using the CHG and inform your nurse when you  arrive at Short Stay. Do not shave (including legs and underarms) for at least 48 hours prior to the first CHG shower.  You may shave your face/neck. Please follow these instructions carefully:  1.  Shower with CHG Soap the night before surgery and the  morning of Surgery.  2.  If you choose to wash your hair, wash your hair first as usual with your  normal  shampoo.  3.  After you shampoo, rinse your hair and body thoroughly to remove the  shampoo.                           4.  Use CHG as you would any other liquid soap.  You can apply chg directly  to the skin and wash                       Gently with a scrungie or clean washcloth.  5.  Apply the CHG Soap to your body ONLY FROM THE NECK DOWN.   Do not use on face/ open                           Wound or open sores. Avoid contact with eyes, ears mouth and genitals (private parts).                       Wash face,  Genitals (private parts) with your normal soap.             6.  Wash thoroughly, paying special attention to the area where your surgery  will be performed.  7.  Thoroughly rinse your body with warm water from the neck down.  8.  DO NOT shower/wash with your normal soap after using and rinsing off  the CHG Soap.                9.  Pat yourself dry with a clean towel.            10.  Wear clean pajamas.            11.  Place clean sheets on your bed the night of your first shower and do not  sleep with pets. Day of Surgery : Do not apply any lotions/deodorants the morning of surgery.  Please wear clean clothes to the hospital/surgery center.  FAILURE TO FOLLOW THESE INSTRUCTIONS MAY RESULT IN THE CANCELLATION OF YOUR SURGERY PATIENT SIGNATURE_________________________________  NURSE SIGNATURE__________________________________  ________________________________________________________________________

## 2020-09-01 ENCOUNTER — Encounter (HOSPITAL_COMMUNITY)
Admission: RE | Admit: 2020-09-01 | Discharge: 2020-09-01 | Disposition: A | Discharge status: Home / Self Care | Payer: PPO | Source: Ambulatory Visit | Attending: Orthopaedic Surgery | Admitting: Orthopaedic Surgery

## 2020-09-01 ENCOUNTER — Encounter (HOSPITAL_COMMUNITY): Payer: Self-pay

## 2020-09-01 ENCOUNTER — Other Ambulatory Visit: Payer: Self-pay

## 2020-09-01 DIAGNOSIS — Z01812 Encounter for preprocedural laboratory examination: Secondary | ICD-10-CM | POA: Insufficient documentation

## 2020-09-01 HISTORY — DX: Essential (primary) hypertension: I10

## 2020-09-01 HISTORY — DX: Dyspnea, unspecified: R06.00

## 2020-09-01 HISTORY — DX: Angina pectoris, unspecified: I20.9

## 2020-09-01 LAB — CBC
HCT: 44.6 % (ref 39.0–52.0)
Hemoglobin: 15.2 g/dL (ref 13.0–17.0)
MCH: 30 pg (ref 26.0–34.0)
MCHC: 34.1 g/dL (ref 30.0–36.0)
MCV: 88.1 fL (ref 80.0–100.0)
Platelets: 213 10*3/uL (ref 150–400)
RBC: 5.06 MIL/uL (ref 4.22–5.81)
RDW: 12.9 % (ref 11.5–15.5)
WBC: 8.6 10*3/uL (ref 4.0–10.5)
nRBC: 0 % (ref 0.0–0.2)

## 2020-09-01 LAB — SURGICAL PCR SCREEN
MRSA, PCR: NEGATIVE
Staphylococcus aureus: NEGATIVE

## 2020-09-01 LAB — BASIC METABOLIC PANEL
Anion gap: 10 (ref 5–15)
BUN: 20 mg/dL (ref 8–23)
CO2: 27 mmol/L (ref 22–32)
Calcium: 9.2 mg/dL (ref 8.9–10.3)
Chloride: 105 mmol/L (ref 98–111)
Creatinine, Ser: 0.91 mg/dL (ref 0.61–1.24)
GFR, Estimated: >60 mL/min (ref >60)
Glucose, Bld: 133 mg/dL — ABNORMAL HIGH (ref 70–99)
Potassium: 3.6 mmol/L (ref 3.5–5.1)
Sodium: 142 mmol/L (ref 135–145)

## 2020-09-01 NOTE — Progress Notes (Signed)
COVID Vaccine Completed: Yes Date COVID Vaccine completed:06/05/20 COVID vaccine manufacturer:   Moderna     PCP - Dr. Viviana Simpler Cardiologist -  Dr. Roney Mans. LOV: 06/14/20 Chest x-ray - 05/09/20 EKG - 06/14/20 Stress Test -  ECHO -  Cardiac Cath -  Pacemaker/ICD device last checked:  Sleep Study -  CPAP -   Fasting Blood Sugar -  Checks Blood Sugar _____ times a day  Blood Thinner Instructions: Aspirin Instructions: RN instruct pt. To call surgeon for instructions. Last Dose:  Anesthesia review: Hx: HTN,CAD,Chest pain. Pt. Still working part ime,he goes to the gym 4 times a week.  Patient denies shortness of breath, fever, cough and chest pain at PAT appointment   Patient verbalized understanding of instructions that were given to them at the PAT appointment. Patient was also instructed that they will need to review over the PAT instructions again at home before surgery.

## 2020-09-02 ENCOUNTER — Other Ambulatory Visit: Payer: Self-pay

## 2020-09-02 ENCOUNTER — Telehealth: Payer: Self-pay

## 2020-09-02 NOTE — Telephone Encounter (Signed)
Pt would like to know what medication he needs to stop taking before his surgery. His surgery is the 25th

## 2020-09-03 ENCOUNTER — Other Ambulatory Visit: Payer: Self-pay | Admitting: Physician Assistant

## 2020-09-03 NOTE — Telephone Encounter (Signed)
From our standpoint he can continue to take all his meds.

## 2020-09-06 NOTE — Telephone Encounter (Signed)
Pt called and informed.

## 2020-09-07 ENCOUNTER — Other Ambulatory Visit (HOSPITAL_COMMUNITY)
Admission: RE | Admit: 2020-09-07 | Discharge: 2020-09-07 | Disposition: A | Discharge status: Home / Self Care | Payer: PPO | Source: Ambulatory Visit | Attending: Orthopaedic Surgery | Admitting: Orthopaedic Surgery

## 2020-09-07 DIAGNOSIS — Z20822 Contact with and (suspected) exposure to covid-19: Secondary | ICD-10-CM | POA: Diagnosis not present

## 2020-09-07 DIAGNOSIS — Z01812 Encounter for preprocedural laboratory examination: Secondary | ICD-10-CM | POA: Insufficient documentation

## 2020-09-07 LAB — SARS CORONAVIRUS 2 (TAT 6-24 HRS): SARS Coronavirus 2: NEGATIVE

## 2020-09-09 NOTE — H&P (Signed)
TOTAL KNEE ADMISSION H&P  Patient is being admitted for right total knee arthroplasty.  Subjective:  Chief Complaint:right knee pain.  HPI: Jonathan Griffith, 80 y.o. male, has a history of pain and functional disability in the right knee due to arthritis and has failed non-surgical conservative treatments for greater than 12 weeks to includeNSAID's and/or analgesics, corticosteriod injections, viscosupplementation injections, flexibility and strengthening excercises, use of assistive devices and activity modification.  Onset of symptoms was gradual, starting 4 years ago with gradually worsening course since that time. The patient noted no past surgery on the right knee(s).  Patient currently rates pain in the right knee(s) at 10 out of 10 with activity. Patient has night pain, worsening of pain with activity and weight bearing, pain that interferes with activities of daily living, pain with passive range of motion, crepitus and joint swelling.  Patient has evidence of subchondral sclerosis, periarticular osteophytes and joint space narrowing by imaging studies. There is no active infection.  Patient Active Problem List   Diagnosis Date Noted  . Abnormal prominence of clavicle 05/07/2020  . Unilateral primary osteoarthritis, right knee 12/22/2019  . Bilateral calf pain 12/18/2019  . Bilateral knee pain 12/18/2019  . Epigastric pain 02/20/2019  . Sinus bradycardia 11/07/2016  . Sleep disorder 03/24/2016  . Advanced directives, counseling/discussion 05/13/2014  . GERD (gastroesophageal reflux disease)   . Routine general medical examination at a health care facility 05/05/2011  . Coronary artery disease with angina pectoris ([place]) 04/19/2010  . Osteoarthritis of more than one site 03/15/2007  . Hyperlipemia 03/04/2007  . ALLERGIC RHINITIS 03/04/2007   Past Medical History:  Diagnosis Date  . Allergic rhinitis   . Anginal pain ([place])   . CAD (coronary artery disease)   . Chest pain   .  Duodenal ulcer   . Dyspnea    On exertions  . ED (erectile dysfunction)   . GERD (gastroesophageal reflux disease)   . History of colonoscopy   . Hyperlipidemia   . Hypertension   . Osteoarthritis     Past Surgical History:  Procedure Laterality Date  . APPENDECTOMY  1964  . CARDIAC CATHETERIZATION  03/2010   diffuse early disease  . CATARACT EXTRACTION Right 04/18/2016  . INGUINAL HERNIA REPAIR Bilateral 1970   double hernia repair   . LEFT HEART CATH AND CORONARY ANGIOGRAPHY N/A 10/12/2016   Procedure: Left Heart Cath and Coronary Angiography;  Surgeon: Burnell Blanks, MD;  Location: [facility];  Service: Cardiovascular;  Laterality: N/A;  . RETINAL DETACHMENT SURGERY Right 2003  . TONSILLECTOMY      No current facility-administered medications for this encounter.   Current Outpatient Medications  Medication Sig Dispense Refill Last Dose  . ALPHAGAN P 0.1 % SOLN Place 2 drops into the right eye daily.     Marland Kitchen aspirin 81 MG tablet Take 81 mg by mouth at bedtime.     . Cholecalciferol (VITAMIN D-3) 125 MCG (5000 UT) TABS Take 5,000 Units by mouth at bedtime.     . ILEVRO 0.3 % ophthalmic suspension Place 1 drop into the right eye at bedtime.     Marland Kitchen losartan (COZAAR) 50 MG tablet TAKE 1 TABLET BY MOUTH EVERY DAY (Patient taking differently: Take 50 mg by mouth at bedtime.) 90 tablet 3   . Multiple Vitamins-Minerals (MULTIVITAMIN WITH MINERALS) tablet Take 1 tablet by mouth at bedtime.     . nitroGLYCERIN (NITROSTAT) 0.4 MG SL tablet Place 1 tablet (0.4 mg total) under the  tongue every 5 (five) minutes as needed for chest pain (x 3 doses). 25 tablet 6   . omeprazole (PRILOSEC) 20 MG capsule TAKE 1 CAPSULE (20 MG TOTAL) BY MOUTH DAILY. ON AN EMPTY STOMACH (Patient taking differently: Take 20 mg by mouth at bedtime. On an empty stomach) 90 capsule 3   . prednisoLONE acetate (PRED FORTE) 1 % ophthalmic suspension Place 2 drops into the right eye daily.     . simvastatin  (ZOCOR) 40 MG tablet TAKE ONE TABLET BY MOUTH EACH EVENING (Patient taking differently: Take 40 mg by mouth at bedtime.) 90 tablet 3   . vitamin B-12 (CYANOCOBALAMIN) 500 MCG tablet Take 500 mcg by mouth daily.      Allergies  Allergen Reactions  . Ibuprofen     REACTION: ulcer    Social History   Tobacco Use  . Smoking status: Former Smoker    Packs/day: 1.00    Years: 10.00    Pack years: 10.00    Types: Cigars    Quit date: 07/18/2007    Years since quitting: 13.1  . Smokeless tobacco: Never Used  . Tobacco comment: Occ cigar - no cigarettes - stopped in 6-09  Substance Use Topics  . Alcohol use: Yes    Alcohol/week: 1.0 standard drink    Types: 1 Standard drinks or equivalent per week    Comment: 3-4 drinks per week    Family History  Problem Relation Age of Onset  . Coronary artery disease Mother        CABG in 92's  . Brain cancer Mother        that was metastaticfrom lung   . Stomach cancer Father 75  . Diabetes Sister   . Stroke Maternal Grandmother      Review of Systems  Musculoskeletal: Positive for gait problem and joint swelling.  All other systems reviewed and are negative.   Objective:  Physical Exam Vitals reviewed.  Constitutional:      Appearance: Normal appearance.  Eyes:     Extraocular Movements: Extraocular movements intact.     Pupils: Pupils are equal, round, and reactive to light.  Cardiovascular:     Rate and Rhythm: Normal rate.  Pulmonary:     Effort: Pulmonary effort is normal.     Breath sounds: Normal breath sounds.  Abdominal:     Palpations: Abdomen is soft.  Musculoskeletal:     Cervical back: Normal range of motion and neck supple.     Right knee: Effusion and bony tenderness present. Decreased range of motion. Tenderness present over the medial joint line, lateral joint line and patellar tendon. Abnormal alignment and abnormal meniscus.  Neurological:     Mental Status: He is alert and oriented to person, place, and  time.  Psychiatric:        Behavior: Behavior normal.     Vital signs in last 24 hours:    Labs:   Estimated body mass index is 27.72 kg/m as calculated from the following:   Height as of 09/01/20: 5\' 7"  (1.702 m).   Weight as of 09/01/20: 80.3 kg.   Imaging Review Plain radiographs demonstrate severe degenerative joint disease of the right knee(s). The overall alignment ismild valgus. The bone quality appears to be good for age and reported activity level.      Assessment/Plan:  End stage arthritis, right knee   The patient history, physical examination, clinical judgment of the provider and imaging studies are consistent with end stage degenerative joint  disease of the right knee(s) and total knee arthroplasty is deemed medically necessary. The treatment options including medical management, injection therapy arthroscopy and arthroplasty were discussed at length. The risks and benefits of total knee arthroplasty were presented and reviewed. The risks due to aseptic loosening, infection, stiffness, patella tracking problems, thromboembolic complications and other imponderables were discussed. The patient acknowledged the explanation, agreed to proceed with the plan and consent was signed. Patient is being admitted for inpatient treatment for surgery, pain control, PT, OT, prophylactic antibiotics, VTE prophylaxis, progressive ambulation and ADL's and discharge planning. The patient is planning to be discharged home with home health services

## 2020-09-10 ENCOUNTER — Ambulatory Visit (HOSPITAL_COMMUNITY): Payer: PPO | Admitting: Anesthesiology

## 2020-09-10 ENCOUNTER — Other Ambulatory Visit: Payer: Self-pay

## 2020-09-10 ENCOUNTER — Observation Stay (HOSPITAL_COMMUNITY): Payer: PPO

## 2020-09-10 ENCOUNTER — Encounter (HOSPITAL_COMMUNITY)
Admission: RE | Disposition: A | Discharge status: Home / Self Care | Payer: Self-pay | Source: Ambulatory Visit | Attending: Orthopaedic Surgery

## 2020-09-10 ENCOUNTER — Observation Stay (HOSPITAL_COMMUNITY)
Admission: RE | Admit: 2020-09-10 | Discharge: 2020-09-11 | Disposition: A | Discharge status: Home / Self Care | Payer: PPO | Source: Ambulatory Visit | Attending: Orthopaedic Surgery | Admitting: Orthopaedic Surgery

## 2020-09-10 ENCOUNTER — Encounter (HOSPITAL_COMMUNITY): Payer: Self-pay | Admitting: Orthopaedic Surgery

## 2020-09-10 DIAGNOSIS — M1711 Unilateral primary osteoarthritis, right knee: Secondary | ICD-10-CM | POA: Diagnosis not present

## 2020-09-10 DIAGNOSIS — Z87891 Personal history of nicotine dependence: Secondary | ICD-10-CM | POA: Insufficient documentation

## 2020-09-10 DIAGNOSIS — I10 Essential (primary) hypertension: Secondary | ICD-10-CM | POA: Insufficient documentation

## 2020-09-10 DIAGNOSIS — G8918 Other acute postprocedural pain: Secondary | ICD-10-CM | POA: Diagnosis not present

## 2020-09-10 DIAGNOSIS — Z79899 Other long term (current) drug therapy: Secondary | ICD-10-CM | POA: Insufficient documentation

## 2020-09-10 DIAGNOSIS — Z7982 Long term (current) use of aspirin: Secondary | ICD-10-CM | POA: Diagnosis not present

## 2020-09-10 DIAGNOSIS — Z471 Aftercare following joint replacement surgery: Secondary | ICD-10-CM | POA: Diagnosis not present

## 2020-09-10 DIAGNOSIS — J309 Allergic rhinitis, unspecified: Secondary | ICD-10-CM | POA: Diagnosis not present

## 2020-09-10 DIAGNOSIS — I2510 Atherosclerotic heart disease of native coronary artery without angina pectoris: Secondary | ICD-10-CM | POA: Insufficient documentation

## 2020-09-10 DIAGNOSIS — E785 Hyperlipidemia, unspecified: Secondary | ICD-10-CM | POA: Diagnosis not present

## 2020-09-10 DIAGNOSIS — Z96651 Presence of right artificial knee joint: Secondary | ICD-10-CM | POA: Diagnosis not present

## 2020-09-10 HISTORY — PX: TOTAL KNEE ARTHROPLASTY: SHX125

## 2020-09-10 LAB — TYPE AND SCREEN
ABO/RH(D): O POS
Antibody Screen: NEGATIVE

## 2020-09-10 LAB — ABO/RH: ABO/RH(D): O POS

## 2020-09-10 SURGERY — ARTHROPLASTY, KNEE, TOTAL
Anesthesia: Monitor Anesthesia Care | Site: Knee | Laterality: Right

## 2020-09-10 MED ORDER — BUPIVACAINE-EPINEPHRINE 0.25% -1:200000 IJ SOLN
INTRAMUSCULAR | Status: DC | PRN
  Administered 2020-09-10: 30 mL

## 2020-09-10 MED ORDER — ACETAMINOPHEN 325 MG PO TABS
325.0000 mg | ORAL_TABLET | Freq: Four times a day (QID) | ORAL | Status: DC | PRN
  Filled 2020-09-10: qty 2

## 2020-09-10 MED ORDER — DOCUSATE SODIUM 100 MG PO CAPS
100.0000 mg | ORAL_CAPSULE | Freq: Two times a day (BID) | ORAL | Status: DC
  Administered 2020-09-10 – 2020-09-11 (×2): 100 mg via ORAL
  Filled 2020-09-10 (×2): qty 1

## 2020-09-10 MED ORDER — ASPIRIN 81 MG PO CHEW
81.0000 mg | CHEWABLE_TABLET | Freq: Two times a day (BID) | ORAL | Status: DC
  Administered 2020-09-10 – 2020-09-11 (×2): 81 mg via ORAL
  Filled 2020-09-10 (×2): qty 1

## 2020-09-10 MED ORDER — SODIUM CHLORIDE 0.9 % IR SOLN
Status: DC | PRN
  Administered 2020-09-10: 1000 mL

## 2020-09-10 MED ORDER — HYDROMORPHONE HCL 1 MG/ML IJ SOLN: 0.2500 mg | INTRAMUSCULAR | Status: DC | PRN

## 2020-09-10 MED ORDER — ORAL CARE MOUTH RINSE: 15.0000 mL | Freq: Once | OROMUCOSAL | Status: AC

## 2020-09-10 MED ORDER — VITAMIN D 25 MCG (1000 UNIT) PO TABS
5000.0000 [IU] | ORAL_TABLET | Freq: Every day | ORAL | Status: DC
  Administered 2020-09-10: 5000 [IU] via ORAL
  Filled 2020-09-10: qty 5

## 2020-09-10 MED ORDER — ONDANSETRON HCL 4 MG/2ML IJ SOLN
INTRAMUSCULAR | Status: AC
  Filled 2020-09-10: qty 4

## 2020-09-10 MED ORDER — PHENOL 1.4 % MT LIQD: 1.0000 | OROMUCOSAL | Status: DC | PRN

## 2020-09-10 MED ORDER — SODIUM CHLORIDE 0.9 % IV SOLN: INTRAVENOUS | Status: DC

## 2020-09-10 MED ORDER — PREDNISOLONE ACETATE 1 % OP SUSP
2.0000 [drp] | Freq: Every day | OPHTHALMIC | Status: DC
  Administered 2020-09-11: 2 [drp] via OPHTHALMIC
  Filled 2020-09-10: qty 5

## 2020-09-10 MED ORDER — BUPIVACAINE IN DEXTROSE 0.75-8.25 % IT SOLN
INTRATHECAL | Status: DC | PRN
  Administered 2020-09-10: 1.8 mL via INTRATHECAL

## 2020-09-10 MED ORDER — DEXAMETHASONE SODIUM PHOSPHATE 10 MG/ML IJ SOLN
INTRAMUSCULAR | Status: DC | PRN
  Administered 2020-09-10: 5 mg

## 2020-09-10 MED ORDER — NITROGLYCERIN 0.4 MG SL SUBL: 0.4000 mg | SUBLINGUAL_TABLET | SUBLINGUAL | Status: DC | PRN

## 2020-09-10 MED ORDER — LOSARTAN POTASSIUM 50 MG PO TABS
50.0000 mg | ORAL_TABLET | Freq: Every day | ORAL | Status: DC
  Administered 2020-09-10: 50 mg via ORAL
  Filled 2020-09-10: qty 1

## 2020-09-10 MED ORDER — PROPOFOL 1000 MG/100ML IV EMUL
INTRAVENOUS | Status: AC
  Filled 2020-09-10: qty 100

## 2020-09-10 MED ORDER — FENTANYL CITRATE (PF) 100 MCG/2ML IJ SOLN
50.0000 ug | INTRAMUSCULAR | Status: AC
  Administered 2020-09-10: 50 ug via INTRAVENOUS
  Filled 2020-09-10: qty 2

## 2020-09-10 MED ORDER — DEXAMETHASONE SODIUM PHOSPHATE 10 MG/ML IJ SOLN
INTRAMUSCULAR | Status: AC
  Filled 2020-09-10: qty 2

## 2020-09-10 MED ORDER — DIPHENHYDRAMINE HCL 12.5 MG/5ML PO ELIX: 12.5000 mg | ORAL_SOLUTION | ORAL | Status: DC | PRN

## 2020-09-10 MED ORDER — LIDOCAINE HCL (PF) 2 % IJ SOLN
INTRAMUSCULAR | Status: AC
  Filled 2020-09-10: qty 5

## 2020-09-10 MED ORDER — ALUM & MAG HYDROXIDE-SIMETH 200-200-20 MG/5ML PO SUSP: 30.0000 mL | ORAL | Status: DC | PRN

## 2020-09-10 MED ORDER — ONDANSETRON HCL 4 MG PO TABS: 4.0000 mg | ORAL_TABLET | Freq: Four times a day (QID) | ORAL | Status: DC | PRN

## 2020-09-10 MED ORDER — ONDANSETRON HCL 4 MG/2ML IJ SOLN: 4.0000 mg | Freq: Once | INTRAMUSCULAR | Status: DC | PRN

## 2020-09-10 MED ORDER — TRANEXAMIC ACID-NACL 1000-0.7 MG/100ML-% IV SOLN
1000.0000 mg | INTRAVENOUS | Status: AC
  Administered 2020-09-10: 1000 mg via INTRAVENOUS
  Filled 2020-09-10: qty 100

## 2020-09-10 MED ORDER — METHOCARBAMOL 500 MG IVPB - SIMPLE MED
500.0000 mg | Freq: Four times a day (QID) | INTRAVENOUS | Status: DC | PRN
  Filled 2020-09-10: qty 50

## 2020-09-10 MED ORDER — METOCLOPRAMIDE HCL 5 MG/ML IJ SOLN: 5.0000 mg | Freq: Three times a day (TID) | INTRAMUSCULAR | Status: DC | PRN

## 2020-09-10 MED ORDER — ONDANSETRON HCL 4 MG/2ML IJ SOLN: 4.0000 mg | Freq: Four times a day (QID) | INTRAMUSCULAR | Status: DC | PRN

## 2020-09-10 MED ORDER — CEFAZOLIN SODIUM-DEXTROSE 2-4 GM/100ML-% IV SOLN
2.0000 g | INTRAVENOUS | Status: AC
  Administered 2020-09-10: 2 g via INTRAVENOUS
  Filled 2020-09-10: qty 100

## 2020-09-10 MED ORDER — 0.9 % SODIUM CHLORIDE (POUR BTL) OPTIME
TOPICAL | Status: DC | PRN
  Administered 2020-09-10: 1000 mL

## 2020-09-10 MED ORDER — ONDANSETRON HCL 4 MG/2ML IJ SOLN
INTRAMUSCULAR | Status: DC | PRN
  Administered 2020-09-10: 4 mg via INTRAVENOUS

## 2020-09-10 MED ORDER — OXYCODONE HCL 5 MG PO TABS
5.0000 mg | ORAL_TABLET | ORAL | Status: DC | PRN
  Administered 2020-09-10 – 2020-09-11 (×3): 5 mg via ORAL
  Administered 2020-09-11: 10 mg via ORAL
  Filled 2020-09-10: qty 1
  Filled 2020-09-10: qty 2
  Filled 2020-09-10 (×2): qty 1

## 2020-09-10 MED ORDER — BUPIVACAINE-EPINEPHRINE (PF) 0.25% -1:200000 IJ SOLN
INTRAMUSCULAR | Status: AC
  Filled 2020-09-10: qty 30

## 2020-09-10 MED ORDER — METHOCARBAMOL 500 MG PO TABS
500.0000 mg | ORAL_TABLET | Freq: Four times a day (QID) | ORAL | Status: DC | PRN
  Administered 2020-09-10 – 2020-09-11 (×2): 500 mg via ORAL
  Filled 2020-09-10 (×2): qty 1

## 2020-09-10 MED ORDER — HYDROMORPHONE HCL 1 MG/ML IJ SOLN: 0.5000 mg | INTRAMUSCULAR | Status: DC | PRN

## 2020-09-10 MED ORDER — NEPAFENAC 0.1 % OP SUSP
1.0000 [drp] | Freq: Every day | OPHTHALMIC | Status: DC
  Filled 2020-09-10: qty 3

## 2020-09-10 MED ORDER — EPHEDRINE SULFATE-NACL 50-0.9 MG/10ML-% IV SOSY
PREFILLED_SYRINGE | INTRAVENOUS | Status: DC | PRN
  Administered 2020-09-10 (×2): 10 mg via INTRAVENOUS

## 2020-09-10 MED ORDER — PROPOFOL 500 MG/50ML IV EMUL
INTRAVENOUS | Status: DC | PRN
  Administered 2020-09-10: 75 ug/kg/min via INTRAVENOUS

## 2020-09-10 MED ORDER — OXYCODONE HCL 5 MG PO TABS: 10.0000 mg | ORAL_TABLET | ORAL | Status: DC | PRN

## 2020-09-10 MED ORDER — OXYCODONE HCL 5 MG PO TABS: 5.0000 mg | ORAL_TABLET | Freq: Once | ORAL | Status: DC | PRN

## 2020-09-10 MED ORDER — POLYETHYLENE GLYCOL 3350 17 G PO PACK: 17.0000 g | PACK | Freq: Every day | ORAL | Status: DC | PRN

## 2020-09-10 MED ORDER — PANTOPRAZOLE SODIUM 40 MG PO TBEC
40.0000 mg | DELAYED_RELEASE_TABLET | Freq: Every day | ORAL | Status: DC
  Administered 2020-09-10 – 2020-09-11 (×2): 40 mg via ORAL
  Filled 2020-09-10 (×2): qty 1

## 2020-09-10 MED ORDER — CEFAZOLIN SODIUM-DEXTROSE 1-4 GM/50ML-% IV SOLN
1.0000 g | Freq: Four times a day (QID) | INTRAVENOUS | Status: AC
  Administered 2020-09-10 (×2): 1 g via INTRAVENOUS
  Filled 2020-09-10 (×2): qty 50

## 2020-09-10 MED ORDER — POVIDONE-IODINE 10 % EX SWAB
2.0000 "application " | Freq: Once | CUTANEOUS | Status: AC
  Administered 2020-09-10: 2 via TOPICAL

## 2020-09-10 MED ORDER — SIMVASTATIN 40 MG PO TABS
40.0000 mg | ORAL_TABLET | Freq: Every day | ORAL | Status: DC
  Administered 2020-09-10: 40 mg via ORAL
  Filled 2020-09-10: qty 1

## 2020-09-10 MED ORDER — BRIMONIDINE TARTRATE 0.15 % OP SOLN
1.0000 [drp] | Freq: Every day | OPHTHALMIC | Status: DC
  Administered 2020-09-11: 1 [drp] via OPHTHALMIC
  Filled 2020-09-10: qty 5

## 2020-09-10 MED ORDER — ACETAMINOPHEN 10 MG/ML IV SOLN: 1000.0000 mg | Freq: Once | INTRAVENOUS | Status: DC | PRN

## 2020-09-10 MED ORDER — OXYCODONE HCL 5 MG/5ML PO SOLN: 5.0000 mg | Freq: Once | ORAL | Status: DC | PRN

## 2020-09-10 MED ORDER — LACTATED RINGERS IV SOLN: INTRAVENOUS | Status: DC

## 2020-09-10 MED ORDER — METOCLOPRAMIDE HCL 5 MG PO TABS: 5.0000 mg | ORAL_TABLET | Freq: Three times a day (TID) | ORAL | Status: DC | PRN

## 2020-09-10 MED ORDER — MENTHOL 3 MG MT LOZG: 1.0000 | LOZENGE | OROMUCOSAL | Status: DC | PRN

## 2020-09-10 MED ORDER — ROPIVACAINE HCL 7.5 MG/ML IJ SOLN
INTRAMUSCULAR | Status: DC | PRN
  Administered 2020-09-10: 20 mL via PERINEURAL

## 2020-09-10 MED ORDER — LIDOCAINE 2% (20 MG/ML) 5 ML SYRINGE
INTRAMUSCULAR | Status: DC | PRN
  Administered 2020-09-10: 50 mg via INTRAVENOUS

## 2020-09-10 MED ORDER — CYANOCOBALAMIN 500 MCG PO TABS
500.0000 ug | ORAL_TABLET | Freq: Every day | ORAL | Status: DC
  Administered 2020-09-11: 500 ug via ORAL
  Filled 2020-09-10: qty 1

## 2020-09-10 MED ORDER — CHLORHEXIDINE GLUCONATE 0.12 % MT SOLN
15.0000 mL | Freq: Once | OROMUCOSAL | Status: AC
  Administered 2020-09-10: 15 mL via OROMUCOSAL

## 2020-09-10 SURGICAL SUPPLY — 58 items
APL SKNCLS STERI-STRIP NONHPOA (GAUZE/BANDAGES/DRESSINGS)
BAG SPEC THK2 15X12 ZIP CLS (MISCELLANEOUS) ×1
BAG ZIPLOCK 12X15 (MISCELLANEOUS) ×1 IMPLANT
BENZOIN TINCTURE PRP APPL 2/3 (GAUZE/BANDAGES/DRESSINGS) IMPLANT
BLADE SAG 18X100X1.27 (BLADE) ×1 IMPLANT
BLADE SURG SZ10 CARB STEEL (BLADE) ×2 IMPLANT
BNDG ELASTIC 6X5.8 VLCR STR LF (GAUZE/BANDAGES/DRESSINGS) ×3 IMPLANT
BOWL SMART MIX CTS (DISPOSABLE) IMPLANT
BSPLAT TIB 5 KN TRITANIUM (Knees) ×1 IMPLANT
COOLER ICEMAN CLASSIC (MISCELLANEOUS) ×2 IMPLANT
COVER SURGICAL LIGHT HANDLE (MISCELLANEOUS) ×2 IMPLANT
COVER WAND RF STERILE (DRAPES) IMPLANT
CUFF TOURN SGL QUICK 34 (TOURNIQUET CUFF) ×2
CUFF TRNQT CYL 34X4.125X (TOURNIQUET CUFF) ×1 IMPLANT
DECANTER SPIKE VIAL GLASS SM (MISCELLANEOUS) IMPLANT
DRAPE U-SHAPE 47X51 STRL (DRAPES) ×2 IMPLANT
DRSG PAD ABDOMINAL 8X10 ST (GAUZE/BANDAGES/DRESSINGS) ×4 IMPLANT
DURAPREP 26ML APPLICATOR (WOUND CARE) ×2 IMPLANT
ELECT BLADE TIP CTD 4 INCH (ELECTRODE) ×2 IMPLANT
ELECT REM PT RETURN 15FT ADLT (MISCELLANEOUS) ×2 IMPLANT
FEMORAL POSTERIOR SZ4 RT (Femur) IMPLANT
GAUZE SPONGE 4X4 12PLY STRL (GAUZE/BANDAGES/DRESSINGS) ×2 IMPLANT
GAUZE XEROFORM 1X8 LF (GAUZE/BANDAGES/DRESSINGS) ×1 IMPLANT
GLOVE SRG 8 PF TXTR STRL LF DI (GLOVE) ×2 IMPLANT
GLOVE SURG ENC MOIS LTX SZ7.5 (GLOVE) ×2 IMPLANT
GLOVE SURG LTX SZ8 (GLOVE) ×2 IMPLANT
GLOVE SURG UNDER POLY LF SZ8 (GLOVE) ×4
GOWN STRL REUS W/TWL XL LVL3 (GOWN DISPOSABLE) ×4 IMPLANT
HANDPIECE INTERPULSE COAX TIP (DISPOSABLE) ×2
HOLDER FOLEY CATH W/STRAP (MISCELLANEOUS) ×1 IMPLANT
IMMOBILIZER KNEE 20 (SOFTGOODS) ×2
IMMOBILIZER KNEE 20 THIGH 36 (SOFTGOODS) IMPLANT
INSERT TIB BEAR X3 (Insert) ×1 IMPLANT
KIT TURNOVER KIT A (KITS) ×2 IMPLANT
KNEE PATELLA ASYMMETRIC 10X32 (Knees) ×1 IMPLANT
KNEE TIBIAL COMPONENT SZ5 (Knees) ×1 IMPLANT
NS IRRIG 1000ML POUR BTL (IV SOLUTION) ×2 IMPLANT
PACK TOTAL KNEE CUSTOM (KITS) ×2 IMPLANT
PAD COLD SHLDR UNI WRAP-ON (PAD) ×2
PAD COLD SHLDR WRAP-ON (PAD) ×2 IMPLANT
PAD COLD UNI WRAP-ON (PAD) IMPLANT
PADDING CAST COTTON 6X4 STRL (CAST SUPPLIES) ×4 IMPLANT
PENCIL SMOKE EVACUATOR (MISCELLANEOUS) IMPLANT
PIN FLUTED HEDLESS FIX 3.5X1/8 (PIN) ×1 IMPLANT
POSTERIOR FEMORAL SZ4 RT (Femur) ×2 IMPLANT
PROTECTOR NERVE ULNAR (MISCELLANEOUS) ×2 IMPLANT
SET HNDPC FAN SPRY TIP SCT (DISPOSABLE) ×1 IMPLANT
SET PAD KNEE POSITIONER (MISCELLANEOUS) ×2 IMPLANT
STAPLER VISISTAT 35W (STAPLE) ×1 IMPLANT
STRIP CLOSURE SKIN 1/2X4 (GAUZE/BANDAGES/DRESSINGS) IMPLANT
SUT MNCRL AB 4-0 PS2 18 (SUTURE) IMPLANT
SUT VIC AB 0 CT1 27 (SUTURE) ×2
SUT VIC AB 0 CT1 27XBRD ANTBC (SUTURE) ×1 IMPLANT
SUT VIC AB 1 CT1 36 (SUTURE) ×4 IMPLANT
SUT VIC AB 2-0 CT1 27 (SUTURE) ×4
SUT VIC AB 2-0 CT1 TAPERPNT 27 (SUTURE) ×2 IMPLANT
TRAY FOLEY MTR SLVR 16FR STAT (SET/KITS/TRAYS/PACK) ×2 IMPLANT
WATER STERILE IRR 1000ML POUR (IV SOLUTION) ×3 IMPLANT

## 2020-09-10 NOTE — Interval H&P Note (Signed)
History and Physical Interval Note: Patient understands that he is here today for a right total knee replacement to treat his right knee osteoarthritis.  There is been no acute interval change in his medical status.  See recent H&P.  The risks and benefits of surgery been explained in detail and informed consent is obtained.  The right knee has been marked.  09/10/2020 8:41 AM  Jonathan Griffith  has presented today for surgery, with the diagnosis of osteoarthritis right knee.  The various methods of treatment have been discussed with the patient and family. After consideration of risks, benefits and other options for treatment, the patient has consented to  Procedure(s): RIGHT TOTAL KNEE ARTHROPLASTY (Right) as a surgical intervention.  The patient's history has been reviewed, patient examined, no change in status, stable for surgery.  I have reviewed the patient's chart and labs.  Questions were answered to the patient's satisfaction.     Mcarthur Rossetti

## 2020-09-10 NOTE — Anesthesia Procedure Notes (Signed)
Spinal  Patient location during procedure: OR Start time: 09/10/2020 9:53 AM End time: 09/10/2020 9:55 AM Staffing Performed: anesthesiologist  Anesthesiologist: Darral Dash, DO Preanesthetic Checklist Completed: patient identified, IV checked, site marked, risks and benefits discussed, surgical consent, monitors and equipment checked, pre-op evaluation and timeout performed Spinal Block Patient position: sitting Prep: DuraPrep Patient monitoring: heart rate, cardiac monitor, continuous pulse ox and blood pressure Approach: midline Location: L3-4 Injection technique: single-shot Needle Needle type: Pencan  Needle gauge: 24 G Needle length: 10 cm Additional Notes Patient identified. Risks/Benefits/Options discussed with patient including but not limited to bleeding, infection, nerve damage, paralysis, failed block, incomplete pain control, headache, blood pressure changes, nausea, vomiting, reactions to medications, itching and postpartum back pain. Confirmed with bedside nurse the patient's most recent platelet count. Confirmed with patient that they are not currently taking any anticoagulation, have any bleeding history or any family history of bleeding disorders. Patient expressed understanding and wished to proceed. All questions were answered. Sterile technique was used throughout the entire procedure. Please see nursing notes for vital signs. Warning signs of high block given to the patient including shortness of breath, tingling/numbness in hands, complete motor block, or any concerning symptoms with instructions to call for help. Patient was given instructions on fall risk and not to get out of bed. All questions and concerns addressed with instructions to call with any issues or inadequate analgesia.

## 2020-09-10 NOTE — Anesthesia Procedure Notes (Signed)
Anesthesia Regional Block: Adductor canal block   Pre-Anesthetic Checklist: ,, timeout performed, Correct Patient, Correct Site, Correct Laterality, Correct Procedure, Correct Position, site marked, Risks and benefits discussed,  Surgical consent,  Pre-op evaluation,  At surgeon's request and post-op pain management  Laterality: Right  Prep: Dura Prep       Needles:  Injection technique: Single-shot  Needle Type: Echogenic Stimulator Needle     Needle Length: 10cm  Needle Gauge: 20     Additional Needles:   Procedures:,,,, ultrasound used (permanent image in chart),,,,  Narrative:  Start time: 09/10/2020 8:53 AM End time: 09/10/2020 8:55 AM Injection made incrementally with aspirations every 5 mL.  Performed by: Personally  Anesthesiologist: Darral Dash, DO  Additional Notes: Patient identified. Risks/Benefits/Options discussed with patient including but not limited to bleeding, infection, nerve damage, failed block, incomplete pain control. Patient expressed understanding and wished to proceed. All questions were answered. Sterile technique was used throughout the entire procedure. Please see nursing notes for vital signs. Aspirated in 5cc intervals with injection for negative confirmation. Patient was given instructions on fall risk and not to get out of bed. All questions and concerns addressed with instructions to call with any issues or inadequate analgesia.

## 2020-09-10 NOTE — Plan of Care (Signed)

## 2020-09-10 NOTE — Evaluation (Signed)
Physical Therapy Evaluation Patient Details Name: Jonathan Griffith MRN: [id number] DOB: [birth year] Today's Date: 09/10/2020   History of Present Illness  Pt s/p R TKR and with hx of CAD  Clinical Impression  Pt s/p R TKR and presents with decreased R LE strength/ROM and post op pain limiting functional mobility.  Pt should progress well to dc home with family assist and HHPT follow up.    Follow Up Recommendations Home health PT;Follow surgeon's recommendation for DC plan and follow-up therapies    Equipment Recommendations  None recommended by PT    Recommendations for Other Services       Precautions / Restrictions Precautions Precautions: Knee;Fall Required Braces or Orthoses: Knee Immobilizer - Right Knee Immobilizer - Right: Discontinue once straight leg raise with < 10 degree lag (Pt performed IND SLR this date) Restrictions Weight Bearing Restrictions: No RLE Weight Bearing: Weight bearing as tolerated      Mobility  Bed Mobility Overal bed mobility: Needs Assistance Bed Mobility: Supine to Sit     Supine to sit: Min assist     General bed mobility comments: cues for sequence and use of L LE to self assist    Transfers Overall transfer level: Needs assistance Equipment used: Rolling walker (2 wheeled) Transfers: Sit to/from Stand Sit to Stand: Min assist         General transfer comment: cues for LE management and use of UEs to self assist  Ambulation/Gait Ambulation/Gait assistance: Min assist Gait Distance (Feet): 100 Feet Assistive device: Rolling walker (2 wheeled) Gait Pattern/deviations: Step-to pattern;Step-through pattern;Decreased step length - right;Decreased step length - left;Shuffle;Trunk flexed     General Gait Details: cues for posture, position from RW and initial sequence  Stairs            Wheelchair Mobility    Modified Rankin (Stroke Patients Only)       Balance Overall balance assessment: Needs  assistance Sitting-balance support: No upper extremity supported;Feet supported Sitting balance-Leahy Scale: Fair     Standing balance support: Bilateral upper extremity supported Standing balance-Leahy Scale: Poor                               Pertinent Vitals/Pain Pain Assessment: 0-10 Pain Score: 2  Pain Location: R knee Pain Descriptors / Indicators: Aching;Sore Pain Intervention(s): Limited activity within patient's tolerance;Monitored during session;Premedicated before session    [place] expects to be discharged to:: Private residence Living Arrangements: Spouse/significant other Available Help at Discharge: Family Type of Home: House Home Access: Stairs to enter Entrance Stairs-Rails: None Technical brewer of Steps: 2 Home Layout: One level Home Equipment: Environmental consultant - 2 wheels      Prior Function Level of Independence: Independent               Hand Dominance        Extremity/Trunk Assessment   Upper Extremity Assessment Upper Extremity Assessment: Overall WFL for tasks assessed    Lower Extremity Assessment Lower Extremity Assessment: RLE deficits/detail    Cervical / Trunk Assessment Cervical / Trunk Assessment: Normal  Communication   Communication: No difficulties  Cognition Arousal/Alertness: Awake/alert Behavior During Therapy: WFL for tasks assessed/performed Overall Cognitive Status: Within Functional Limits for tasks assessed  General Comments      Exercises Total Joint Exercises Ankle Circles/Pumps: AROM;Both;15 reps;Supine   Assessment/Plan    PT Assessment Patient needs continued PT services  PT Problem List Decreased strength;Decreased range of motion;Decreased balance;Decreased mobility;Decreased knowledge of use of DME;Pain       PT Treatment Interventions DME instruction;Gait training;Stair training;Functional mobility  training;Therapeutic activities;Therapeutic exercise;Patient/family education    PT Goals (Current goals can be found in the Care Plan section)  Acute Rehab PT Goals Patient Stated Goal: Regain IND PT Goal Formulation: With patient Time For Goal Achievement: 09/16/20 Potential to Achieve Goals: Good    Frequency 7X/week   Barriers to discharge        Co-evaluation               AM-PAC PT "6 Clicks" Mobility  Outcome Measure Help needed turning from your back to your side while in a flat bed without using bedrails?: A Little Help needed moving from lying on your back to sitting on the side of a flat bed without using bedrails?: A Little Help needed moving to and from a bed to a chair (including a wheelchair)?: A Little Help needed standing up from a chair using your arms (e.g., wheelchair or bedside chair)?: A Little Help needed to walk in hospital room?: A Little Help needed climbing 3-5 steps with a railing? : A Lot 6 Click Score: 17    End of Session Equipment Utilized During Treatment: Gait belt Activity Tolerance: Patient tolerated treatment well Patient left: in chair;with call bell/phone within reach;with chair alarm set;with nursing/sitter in room Nurse Communication: Mobility status PT Visit Diagnosis: Difficulty in walking, not elsewhere classified (R26.2)    Time: 3383-2919 PT Time Calculation (min) (ACUTE ONLY): 25 min   Charges:   PT Evaluation $PT Eval Low Complexity: 1 Low PT Treatments $Gait Training: 8-22 mins        [place] Pager [phone] Office [phone]   , 09/10/2020, 4:35 PM

## 2020-09-10 NOTE — Op Note (Signed)
Jonathan Griffith, Jonathan Griffith MEDICAL RECORD NO: [id number] ACCOUNT NO: [id number] DATE OF BIRTH: [birth year] FACILITY: [facility] LOCATION: [id number] PHYSICIAN: Lind Guest. Ninfa Linden, MD  Operative Report   DATE OF PROCEDURE: 09/10/2020   PREOPERATIVE DIAGNOSIS:  Primary osteoarthritis and degenerative joint disease, right knee.  POSTOPERATIVE DIAGNOSIS:  Primary osteoarthritis and degenerative joint disease, right knee.  PROCEDURE:  Right total knee arthroplasty.  IMPLANTS:  Stryker Triathlon press fit knee system with size 4 femur, size 5 tibial tray, 13 mm fixed bearing polyethylene insert, size 32 patellar button.  SURGEON:  Jean Rosenthal, M.D.  ASSISTANT:  Erskine Emery, PA-C.  ANESTHESIA: 1.  Right lower extremity adductor canal block. 2.  Spinal. 3.  Local with a mixture of 0.25% Marcaine with epinephrine around the arthrotomy.  TOURNIQUET TIME:  Under one hour.  ESTIMATED BLOOD LOSS:  Less than 100 mL  ANTIBIOTICS:  2 g IV Ancef.  COMPLICATIONS:  None.  INDICATIONS:  The patient is a very active 80 year old gentleman with well documented debilitating arthritis involving his right knee.  He has tried and failed all forms of conservative treatment and at this point, his right knee pain is detrimentally  affecting his mobility, his quality of life and his activities of daily living to the point he does wish to proceed with a total knee arthroplasty on the right side. We have had a long and thorough discussion about the risk of acute blood loss anemia,  nerve or vessel injury, fracture, infection, DVT, implant failure and skin and soft tissue issues.  We talked about her goals being reduced pain, improved mobility, and overall improved quality of life.  DESCRIPTION OF PROCEDURE:  After informed consent was obtained, appropriate right knee was marked.  Anesthesia was obtained and adductor canal block of the right lower extremity in the holding room.  He was then brought to the  operating room and sat up  on the operating table.  Spinal anesthesia was obtained.  He was laid in supine position.  A Foley catheter was placed and a nonsterile tourniquet was placed around his upper right thigh.  His right thigh, knee, leg, and ankle were prepped and draped  with DuraPrep and sterile drapes including sterile stockinette.  A timeout was called and he was identified correct patient, correct right knee.  We then used the Esmarch to wrap out the leg and tourniquet was inflated to 300 mm of pressure.  We then  made a direct midline incision over the patella and carried this proximally and distally.  We dissected down to the knee joint, carried out a medial parapatellar arthrotomy and found significant cartilage wear at the lateral compartment of the knee.  We  removed ACL, PCL, medial and lateral meniscus.  We removed periarticular osteophytes from around the knee as well.  Using extramedullary cutting guide for making our proximal tibia cut, we set this for correction of varus and valgus and neutral slope.   We made this cut to take 9 mm off the high side.  We made this cut without difficulty.  We then used an intramedullary nail to the end of the femoral canal for a distal femoral cutting guide that was intramedullary based and we have set this for a right  knee at 5 degrees externally rotated for an 8 mm distal femoral cut.  We made that cut without any difficulty, and brought the knee back down to full extension.  He actually hyperextended with a 9 mm block, we then went back to the  femur and put our  femoral sizing guide based off the epicondylar axis.  Based off of this, we chose a size 4 femur.  We placed our 4-in-1 cutting block for a size 4 femur, made anterior and posterior cuts, followed by our chamfer cuts.  We then made our femoral box cut.   Attention was then turned back to the tibia, which was a size 5 tibial tray for coverage setting the rotation off the tibial tubercle and  the femur.  We made our keel punch off of this, all for press-fit insert,his bone quality was very solid and  hard with a size 5 trial tibia, we trialed out of 4 right femur and 11 and then 13 mm fixed bearing polyethylene insert and we were pleased with the range of motion and stability with a size 13 insert.  We then made our patellar cut and drilled three  holes for a size 32 press-fit patellar button.  We then removed all instrumentation from the knee and irrigated the knee with normal saline solution using pulsatile lavage.  We dried the knee very well and then placed our Marcaine with epinephrine  mixture around the arthrotomy.  We dried the knee again with the knee in a flexed position.  We placed our real pressfit Stryker tibial tray, size 5 followed by a real size 4 right press-fit femur.  We placed our 13 mm fixed bearing polyethylene insert  and press fit our patellar button.  I then put the knee through several cycles of motion.  We were pleased with range of motion and stability.  We then let the tourniquet down and hemostasis was obtained with electrocautery.  We closed the arthrotomy  with interrupted #1 Vicryl suture followed by 0 Vicryl to close the deep tissue and 2-0 Vicryl to close the subcutaneous tissue.  The skin was reapproximated with staples.  A well-padded sterile dressing was applied.  The patient was taken to recovery in  stable condition with all final counts were correct and no complications noted.  PREOPERATIVE NOTE:  Erskine Emery, PA-C did assist during the entire case.  An assistant was crucial for facilitating all aspects of this case.   Elián.Darby D: 09/10/2020 11:10:46 am T: 09/10/2020 11:43:00 pm  JOB: [id number]/ [id number]

## 2020-09-10 NOTE — Brief Op Note (Signed)
09/10/2020  11:12 AM  PATIENT:  [facility]  80 y.o. male  PRE-OPERATIVE DIAGNOSIS:  osteoarthritis right knee  POST-OPERATIVE DIAGNOSIS:  osteoarthritis right knee  PROCEDURE:  Procedure(s): RIGHT TOTAL KNEE ARTHROPLASTY (Right)  SURGEON:  Surgeon(s) and Role:    Mcarthur Rossetti, MD - Primary  PHYSICIAN ASSISTANT:  Benita Stabile, PA-C  ANESTHESIA:   spinal, regional, local  COUNTS:  YES  TOURNIQUET:   Total Tourniquet Time Documented: Thigh (Right) - 36 minutes Total: Thigh (Right) - 36 minutes   DICTATION: .Other Dictation: Dictation Number [id number]  PLAN OF CARE: Admit for overnight observation  PATIENT DISPOSITION:  PACU - hemodynamically stable.   Delay start of Pharmacological VTE agent (>24hrs) due to surgical blood loss or risk of bleeding: no

## 2020-09-10 NOTE — Progress Notes (Signed)
AssistedDr. Greg Stoltzfus with right, ultrasound guided, adductor canal block. Side rails up, monitors on throughout procedure. See vital signs in flow sheet. Tolerated Procedure well.  

## 2020-09-10 NOTE — Transfer of Care (Signed)
Immediate Anesthesia Transfer of Care Note  Patient: Jonathan Griffith  Procedure(s) Performed: Procedure(s): RIGHT TOTAL KNEE ARTHROPLASTY (Right)  Patient Location: PACU  Anesthesia Type:Spinal  Level of Consciousness: Alert, Awake, Oriented  Airway & Oxygen Therapy: Patient Spontanous Breathing  Post-op Assessment: Report given to RN  Post vital signs: Reviewed and stable  Last Vitals:  Vitals:   09/10/20 0853 09/10/20 0858  BP: (!) 153/71   Pulse: (!) 56 (!) 55  Resp: 15 19  Temp:    SpO2: 67% 209%    Complications: No apparent anesthesia complications

## 2020-09-10 NOTE — Anesthesia Postprocedure Evaluation (Signed)
Anesthesia Post Note  Patient: Jonathan Griffith  Procedure(s) Performed: RIGHT TOTAL KNEE ARTHROPLASTY (Right Knee)     Patient location during evaluation: PACU Anesthesia Type: Regional, MAC and Spinal Level of consciousness: oriented and awake and alert Pain management: pain level controlled Vital Signs Assessment: post-procedure vital signs reviewed and stable Respiratory status: spontaneous breathing, respiratory function stable and patient connected to nasal cannula oxygen Cardiovascular status: blood pressure returned to baseline and stable Postop Assessment: no headache, no backache and no apparent nausea or vomiting Anesthetic complications: no   No complications documented.  Last Vitals:  Vitals:   09/10/20 1355 09/10/20 1455  BP: (!) 159/67 (!) 159/64  Pulse: (!) 57 (!) 55  Resp: 15 16  Temp: 36.6 C (!) 36.4 C  SpO2: 97% 96%    Last Pain:  Vitals:   09/10/20 1455  TempSrc: Oral  PainSc:                  March Rummage 

## 2020-09-10 NOTE — Anesthesia Preprocedure Evaluation (Signed)
Anesthesia Evaluation  Patient identified by MRN, date of birth, ID band Patient awake    Reviewed: Allergy & Precautions, NPO status , Patient's Chart, lab work & pertinent test results  Airway Mallampati: II  TM Distance: >3 FB Neck ROM: Full    Dental  (+) Teeth Intact   Pulmonary former smoker,    Pulmonary exam normal        Cardiovascular hypertension, Pt. on medications + CAD   Rhythm:Regular Rate:Normal     Neuro/Psych negative neurological ROS  negative psych ROS   GI/Hepatic Neg liver ROS, GERD  Medicated,Duodenal ulcer   Endo/Other  negative endocrine ROS  Renal/GU negative Renal ROS  negative genitourinary   Musculoskeletal  (+) Arthritis , Osteoarthritis,    Abdominal (+)  Abdomen: soft. Bowel sounds: normal.  Peds  Hematology negative hematology ROS (+)   Anesthesia Other Findings   Reproductive/Obstetrics                             Anesthesia Physical Anesthesia Plan  ASA: II  Anesthesia Plan: MAC, Regional and Spinal   Post-op Pain Management:  Regional for Post-op pain   Induction: Intravenous  PONV Risk Score and Plan: 1 and Ondansetron, Dexamethasone, Midazolam and Propofol infusion  Airway Management Planned: Simple Face Mask, Natural Airway and Nasal Cannula  Additional Equipment: None  Intra-op Plan:   Post-operative Plan:   Informed Consent: I have reviewed the patients History and Physical, chart, labs and discussed the procedure including the risks, benefits and alternatives for the proposed anesthesia with the patient or authorized representative who has indicated his/her understanding and acceptance.     Dental advisory given  Plan Discussed with: CRNA  Anesthesia Plan Comments: (Lab Results      Component                Value               Date                      WBC                      8.6                 09/01/2020                 HGB                      15.2                09/01/2020                HCT                      44.6                09/01/2020                MCV                      88.1                09/01/2020                PLT  213                 09/01/2020           Lab Results      Component                Value               Date                      NA                       142                 09/01/2020                K                        3.6                 09/01/2020                CO2                      27                  09/01/2020                GLUCOSE                  133 (H)             09/01/2020                BUN                      20                  09/01/2020                CREATININE               0.91                09/01/2020                CALCIUM                  9.2                 09/01/2020                GFRNONAA                 >60                 09/01/2020                GFRAA                    82                  10/09/2016          )        Anesthesia Quick Evaluation

## 2020-09-11 ENCOUNTER — Other Ambulatory Visit: Payer: Self-pay | Admitting: Specialist

## 2020-09-11 DIAGNOSIS — Z96651 Presence of right artificial knee joint: Secondary | ICD-10-CM | POA: Diagnosis not present

## 2020-09-11 DIAGNOSIS — M1711 Unilateral primary osteoarthritis, right knee: Secondary | ICD-10-CM | POA: Diagnosis not present

## 2020-09-11 LAB — CBC
HCT: 36.6 % — ABNORMAL LOW (ref 39.0–52.0)
Hemoglobin: 12.4 g/dL — ABNORMAL LOW (ref 13.0–17.0)
MCH: 29.9 pg (ref 26.0–34.0)
MCHC: 33.9 g/dL (ref 30.0–36.0)
MCV: 88.2 fL (ref 80.0–100.0)
Platelets: 218 10*3/uL (ref 150–400)
RBC: 4.15 MIL/uL — ABNORMAL LOW (ref 4.22–5.81)
RDW: 12.6 % (ref 11.5–15.5)
WBC: 11.5 10*3/uL — ABNORMAL HIGH (ref 4.0–10.5)
nRBC: 0 % (ref 0.0–0.2)

## 2020-09-11 LAB — BASIC METABOLIC PANEL
Anion gap: 7 (ref 5–15)
BUN: 19 mg/dL (ref 8–23)
CO2: 28 mmol/L (ref 22–32)
Calcium: 8.5 mg/dL — ABNORMAL LOW (ref 8.9–10.3)
Chloride: 104 mmol/L (ref 98–111)
Creatinine, Ser: 0.94 mg/dL (ref 0.61–1.24)
GFR, Estimated: >60 mL/min (ref >60)
Glucose, Bld: 146 mg/dL — ABNORMAL HIGH (ref 70–99)
Potassium: 4.3 mmol/L (ref 3.5–5.1)
Sodium: 139 mmol/L (ref 135–145)

## 2020-09-11 MED ORDER — OXYCODONE HCL 5 MG PO TABS: 5.0000 mg | ORAL_TABLET | ORAL | 0 refills | Status: DC | PRN

## 2020-09-11 MED ORDER — OXYCODONE HCL 5 MG PO TABS: 5.0000 mg | ORAL_TABLET | Freq: Four times a day (QID) | ORAL | 0 refills | Status: AC | PRN

## 2020-09-11 MED ORDER — ASPIRIN 81 MG PO CHEW: 81.0000 mg | CHEWABLE_TABLET | Freq: Two times a day (BID) | ORAL | 0 refills | Status: DC

## 2020-09-11 NOTE — Discharge Summary (Signed)
Patient ID: Jonathan Griffith MRN: [id number] DOB/AGE: 81-Mar-1942 81 y.o.  Admit date: 09/10/2020 Discharge date: 09/11/2020  Admission Diagnoses:  Principal Problem:   Unilateral primary osteoarthritis, right knee Active Problems:   Status post total right knee replacement   Discharge Diagnoses:  Same  Past Medical History:  Diagnosis Date  . Allergic rhinitis   . Anginal pain ([place])   . CAD (coronary artery disease)   . Chest pain   . Duodenal ulcer   . Dyspnea    On exertions  . ED (erectile dysfunction)   . GERD (gastroesophageal reflux disease)   . History of colonoscopy   . Hyperlipidemia   . Hypertension   . Osteoarthritis     Surgeries: Procedure(s): RIGHT TOTAL KNEE ARTHROPLASTY on 09/10/2020   Consultants:   Discharged Condition: Improved  Hospital Course: Jonathan Griffith is an 80 y.o. male who was admitted 09/10/2020 for operative treatment ofUnilateral primary osteoarthritis, right knee. Patient has severe unremitting pain that affects sleep, daily activities, and work/hobbies. After pre-op clearance the patient was taken to the operating room on 09/10/2020 and underwent  Procedure(s): RIGHT TOTAL KNEE ARTHROPLASTY.    Patient was given perioperative antibiotics:  Anti-infectives (From admission, onward)   Start     Dose/Rate Route Frequency Ordered Stop   09/10/20 1600  ceFAZolin (ANCEF) IVPB 1 g/50 mL premix        1 g 100 mL/hr over 30 Minutes Intravenous Every 6 hours 09/10/20 1404 09/10/20 2220   09/10/20 0815  ceFAZolin (ANCEF) IVPB 2g/100 mL premix        2 g 200 mL/hr over 30 Minutes Intravenous On call to O.R. 09/10/20 0807 09/10/20 1014       Patient was given sequential compression devices, early ambulation, and chemoprophylaxis to prevent DVT.  Patient benefited maximally from hospital stay and there were no complications.    Recent vital signs:  Patient Vitals for the past 24 hrs:  BP Temp Temp src Pulse Resp SpO2  09/11/20 0547 (!) 124/93  97.7 F (36.5 C) Oral (!) 54 17 97 %  09/11/20 0154 (!) 122/57 97.8 F (36.6 C) -- (!) 56 17 97 %  09/10/20 2137 128/63 97.8 F (36.6 C) Oral 73 17 96 %  09/10/20 1716 (!) 158/87 99.3 F (37.4 C) Oral 75 16 97 %  09/10/20 1455 (!) 159/64 (!) 97.5 F (36.4 C) Oral (!) 55 16 96 %  09/10/20 1355 (!) 159/67 97.8 F (36.6 C) Oral (!) 57 15 97 %  09/10/20 1330 (!) 147/69 99.2 F (37.3 C) -- (!) 52 20 98 %  09/10/20 1315 (!) 144/62 -- -- (!) 54 17 98 %  09/10/20 1300 (!) 147/70 -- -- (!) 50 16 98 %  09/10/20 1245 (!) 144/67 -- -- (!) 54 20 99 %  09/10/20 1230 (!) 146/68 -- -- (!) 57 14 100 %  09/10/20 1215 (!) 147/60 -- -- (!) 52 17 100 %  09/10/20 1200 (!) 108/93 -- -- (!) 54 16 100 %  09/10/20 1145 (!) 139/55 -- -- (!) 51 12 99 %  09/10/20 1130 (!) 137/50 -- -- (!) 59 17 99 %  09/10/20 1124 (!) 129/57 97.8 F (36.6 C) -- (!) 55 12 99 %     Recent laboratory studies:  Recent Labs    09/11/20 0249  WBC 11.5*  HGB 12.4*  HCT 36.6*  PLT 218  NA 139  K 4.3  CL 104  CO2 28  BUN 19  CREATININE 0.94  GLUCOSE 146*  CALCIUM 8.5*     Discharge Medications:   Allergies as of 09/11/2020      Reactions   Ibuprofen    REACTION: ulcer      Medication List    STOP taking these medications   aspirin 81 MG tablet Replaced by: aspirin 81 MG chewable tablet     TAKE these medications   Alphagan P 0.1 % Soln Generic drug: brimonidine Place 2 drops into the right eye daily.   aspirin 81 MG chewable tablet Chew 1 tablet (81 mg total) by mouth 2 (two) times daily. Replaces: aspirin 81 MG tablet   Ilevro 0.3 % ophthalmic suspension Generic drug: nepafenac Place 1 drop into the right eye at bedtime.   losartan 50 MG tablet Commonly known as: COZAAR TAKE 1 TABLET BY MOUTH EVERY DAY What changed: when to take this   multivitamin with minerals tablet Take 1 tablet by mouth at bedtime.   nitroGLYCERIN 0.4 MG SL tablet Commonly known as: NITROSTAT Place 1 tablet (0.4 mg  total) under the tongue every 5 (five) minutes as needed for chest pain (x 3 doses).   omeprazole 20 MG capsule Commonly known as: PRILOSEC TAKE 1 CAPSULE (20 MG TOTAL) BY MOUTH DAILY. ON AN EMPTY STOMACH What changed: when to take this   oxyCODONE 5 MG immediate release tablet Commonly known as: Oxy IR/ROXICODONE Take 1-2 tablets (5-10 mg total) by mouth every 4 (four) hours as needed for moderate pain (pain score 4-6).   prednisoLONE acetate 1 % ophthalmic suspension Commonly known as: PRED FORTE Place 2 drops into the right eye daily.   simvastatin 40 MG tablet Commonly known as: ZOCOR TAKE ONE TABLET BY MOUTH EACH EVENING What changed: See the new instructions.   vitamin B-12 500 MCG tablet Commonly known as: CYANOCOBALAMIN Take 500 mcg by mouth daily.   Vitamin D-3 125 MCG (5000 UT) Tabs Take 5,000 Units by mouth at bedtime.            Durable Medical Equipment  (From admission, onward)         Start     Ordered   09/10/20 1405  DME 3 n 1  Once        09/10/20 1404   09/10/20 1405  DME Walker rolling  Once       Question Answer Comment  Walker: With 5 Inch Wheels   Patient needs a walker to treat with the following condition Status post right knee replacement      09/10/20 1404          Diagnostic Studies: DG Knee Right Port  Result Date: 09/10/2020 CLINICAL DATA:  Status post right knee arthroplasty. EXAM: PORTABLE RIGHT KNEE - 1-2 VIEW COMPARISON:  12/22/2019 FINDINGS: Two views demonstrate normal alignment of a right total knee arthroplasty. Air-fluid level present in the joint after surgery. Anterior surgical drain present. IMPRESSION: Normal alignment of right total knee arthroplasty. Electronically Signed   By: Aletta Edouard M.D.   On: 09/10/2020 12:19    Disposition: Discharge disposition: 01-Home or [place]    Mcarthur Rossetti, MD Follow up in 2 week(s).   Specialty: Orthopedic Surgery Contact  information: [address] [place] [address] [phone]                Signed: Mcarthur Rossetti 09/11/2020, 9:13 AM

## 2020-09-11 NOTE — TOC Progression Note (Signed)
Transition of Care [facility]) - Progression Note    Patient Details  Name: Jonathan Griffith MRN: [id number] Date of Birth: 08-Sep-1940  Transition of Care [facility]) CM/SW Contact  Joaquin Courts, RN Phone Number: 09/11/2020, 11:11 AM  Clinical Narrative:    [facility] for HHPT.  Patient has RW at home, [employer] to deliver 3in1 and cane to bedside for home use.   Expected Discharge Plan: [place] Barriers to Discharge: No Barriers Identified  Expected Discharge Plan and Services Expected Discharge Plan: [place]   Discharge Planning Services: CM Consult Post Acute Care Choice: [place] arrangements for the past 2 months: Single Family Home Expected Discharge Date: 09/11/20               DME Arranged: 3-N-1,Cane DME Agency: [employer] Date DME Agency Contacted: 09/11/20 Time DME Agency Contacted: 1110 Representative spoke with at DME Agency: [place]: PT [place]: [facility] [employer] Date [place]: 09/11/20   Representative spoke with at [place]: pre arranged in md office   Social Determinants of Health (SDOH) Interventions    Readmission Risk Interventions No flowsheet data found.

## 2020-09-11 NOTE — Plan of Care (Signed)
Pt stable at this time. Pt to d/c home with family.

## 2020-09-11 NOTE — Progress Notes (Signed)
Subjective: 1 Day Post-Op Procedure(s) (LRB): RIGHT TOTAL KNEE ARTHROPLASTY (Right) Patient reports pain as moderate.    Objective: Vital signs in last 24 hours: Temp:  [97.5 F (36.4 C)-99.3 F (37.4 C)] 97.7 F (36.5 C) (02/26 0547) Pulse Rate:  [50-75] 54 (02/26 0547) Resp:  [12-20] 17 (02/26 0547) BP: (108-159)/(50-93) 124/93 (02/26 0547) SpO2:  [96 %-100 %] 97 % (02/26 0547)  Intake/Output from previous day: 02/25 0701 - 02/26 0700 In: 1762.5 [P.O.:340; I.V.:1122.5; IV Piggyback:300] Out: 2615 [Urine:2600; Blood:15] Intake/Output this shift: Total I/O In: 1465.1 [P.O.:520; I.V.:945.1] Out: -   Recent Labs    09/11/20 0249  HGB 12.4*   Recent Labs    09/11/20 0249  WBC 11.5*  RBC 4.15*  HCT 36.6*  PLT 218   Recent Labs    09/11/20 0249  NA 139  K 4.3  CL 104  CO2 28  BUN 19  CREATININE 0.94  GLUCOSE 146*  CALCIUM 8.5*   No results for input(s): LABPT, INR in the last 72 hours.  Sensation intact distally Intact pulses distally Dorsiflexion/Plantar flexion intact Incision: scant drainage No cellulitis present Compartment soft   Assessment/Plan: 1 Day Post-Op Procedure(s) (LRB): RIGHT TOTAL KNEE ARTHROPLASTY (Right) Up with therapy Discharge home with home health      Mcarthur Rossetti 09/11/2020, 9:12 AM

## 2020-09-11 NOTE — Plan of Care (Signed)
  Problem: Clinical Measurements: Goal: Diagnostic test results will improve Outcome: Progressing   Problem: Clinical Measurements: Goal: Respiratory complications will improve Outcome: Progressing   Problem: Clinical Measurements: Goal: Cardiovascular complication will be avoided Outcome: Progressing   Problem: Coping: Goal: Level of anxiety will decrease Outcome: Progressing   Problem: Safety: Goal: Ability to remain free from injury will improve Outcome: Progressing   Problem: Skin Integrity: Goal: Risk for impaired skin integrity will decrease Outcome: Progressing   Problem: Education: Goal: Knowledge of the prescribed therapeutic regimen will improve Outcome: Progressing

## 2020-09-11 NOTE — Progress Notes (Signed)
Physical Therapy Treatment Patient Details Name: Jonathan Griffith MRN: [id number] DOB: 05/02/1941 Today's Date: 09/11/2020    History of Present Illness Pt s/p R TKR and with hx of CAD    PT Comments    Pt ambulated 36' with RW, distance limited by R knee pain. Stair training completed. Instructed pt in TKA HEP, he demonstrates good understanding. He is ready to DC home from PT standpoint.   Follow Up Recommendations  Home health PT;Follow surgeon's recommendation for DC plan and follow-up therapies     Equipment Recommendations  None recommended by PT    Recommendations for Other Services       Precautions / Restrictions Precautions Precautions: Knee;Fall Precaution Booklet Issued: Yes (comment) Precaution Comments: instructed pt to not put a pillow under his knee Knee Immobilizer - Right:  (Pt performed IND SLR this date) Restrictions Weight Bearing Restrictions: No RLE Weight Bearing: Weight bearing as tolerated    Mobility  Bed Mobility Overal bed mobility: Modified Independent Bed Mobility: Supine to Sit     Supine to sit: Modified independent (Device/Increase time);HOB elevated     General bed mobility comments: used rail    Transfers Overall transfer level: Needs assistance Equipment used: Rolling walker (2 wheeled) Transfers: Sit to/from Stand Sit to Stand: Supervision         General transfer comment: cues for LE management and use of UEs to self assist  Ambulation/Gait Ambulation/Gait assistance: Supervision Gait Distance (Feet): 90 Feet Assistive device: Rolling walker (2 wheeled) Gait Pattern/deviations: Step-to pattern;Step-through pattern;Decreased step length - right;Decreased step length - left;Shuffle;Trunk flexed Gait velocity: decr   General Gait Details: good sequencing, no loss of balance, distance limited by pain   Stairs Stairs: Yes Stairs assistance: Min assist Stair Management: Forwards;No rails;With walker Number of Stairs:  3 General stair comments: 1 step x 3 trials, VCs sequencing, min A to stabilize RW   Wheelchair Mobility    Modified Rankin (Stroke Patients Only)       Balance Overall balance assessment: Needs assistance Sitting-balance support: No upper extremity supported;Feet supported Sitting balance-Leahy Scale: Good     Standing balance support: Bilateral upper extremity supported Standing balance-Leahy Scale: Fair                              Cognition Arousal/Alertness: Awake/alert Behavior During Therapy: WFL for tasks assessed/performed Overall Cognitive Status: Within Functional Limits for tasks assessed                                        Exercises Total Joint Exercises Ankle Circles/Pumps: AROM;Both;15 reps;Supine Quad Sets: AROM;Right;5 reps;Supine Short Arc Quad: AAROM;AROM;Right;10 reps;Supine Heel Slides: AAROM;Right;10 reps;Supine Hip ABduction/ADduction: AAROM;Right;10 reps;Supine Straight Leg Raises: AROM;Right;5 reps Long Arc Quad: AROM;Right;10 reps;Seated;AAROM Knee Flexion: AAROM;Right;10 reps;Seated Goniometric ROM: 5-60* AAROM R knee    General Comments        Pertinent Vitals/Pain Pain Score: 3  Pain Location: R knee Pain Descriptors / Indicators: Aching;Sore Pain Intervention(s): Limited activity within patient's tolerance;Monitored during session;Premedicated before session;Ice applied    Home Living                      Prior Function            PT Goals (current goals can now be found in the care plan section) Acute Rehab  PT Goals Patient Stated Goal: yardwork PT Goal Formulation: With patient Time For Goal Achievement: 09/16/20 Potential to Achieve Goals: Good Progress towards PT goals: Progressing toward goals    Frequency    7X/week      PT Plan Current plan remains appropriate    Co-evaluation              AM-PAC PT "6 Clicks" Mobility   Outcome Measure  Help needed turning  from your back to your side while in a flat bed without using bedrails?: None Help needed moving from lying on your back to sitting on the side of a flat bed without using bedrails?: A Little Help needed moving to and from a bed to a chair (including a wheelchair)?: None Help needed standing up from a chair using your arms (e.g., wheelchair or bedside chair)?: None Help needed to walk in hospital room?: None Help needed climbing 3-5 steps with a railing? : A Little 6 Click Score: 22    End of Session Equipment Utilized During Treatment: Gait belt Activity Tolerance: Patient tolerated treatment well Patient left: in chair;with call bell/phone within reach;with chair alarm set Nurse Communication: Mobility status PT Visit Diagnosis: Difficulty in walking, not elsewhere classified (R26.2)     Time: 8916-9450 PT Time Calculation (min) (ACUTE ONLY): 31 min  Charges:  $Gait Training: 8-22 mins $Therapeutic Exercise: 8-22 mins                     Blondell Reveal Kistler PT 09/11/2020  Acute Rehabilitation Services Pager [phone] Office [phone]

## 2020-09-11 NOTE — Discharge Instructions (Signed)

## 2020-09-12 ENCOUNTER — Encounter (HOSPITAL_COMMUNITY): Payer: Self-pay | Admitting: Orthopaedic Surgery

## 2020-09-13 ENCOUNTER — Telehealth: Payer: Self-pay

## 2020-09-13 DIAGNOSIS — Z96651 Presence of right artificial knee joint: Secondary | ICD-10-CM | POA: Diagnosis not present

## 2020-09-13 DIAGNOSIS — I25119 Atherosclerotic heart disease of native coronary artery with unspecified angina pectoris: Secondary | ICD-10-CM | POA: Diagnosis not present

## 2020-09-13 DIAGNOSIS — Z471 Aftercare following joint replacement surgery: Secondary | ICD-10-CM | POA: Diagnosis not present

## 2020-09-13 DIAGNOSIS — J309 Allergic rhinitis, unspecified: Secondary | ICD-10-CM | POA: Diagnosis not present

## 2020-09-13 DIAGNOSIS — K219 Gastro-esophageal reflux disease without esophagitis: Secondary | ICD-10-CM | POA: Diagnosis not present

## 2020-09-13 DIAGNOSIS — Z7982 Long term (current) use of aspirin: Secondary | ICD-10-CM | POA: Diagnosis not present

## 2020-09-13 DIAGNOSIS — N529 Male erectile dysfunction, unspecified: Secondary | ICD-10-CM | POA: Diagnosis not present

## 2020-09-13 DIAGNOSIS — I10 Essential (primary) hypertension: Secondary | ICD-10-CM | POA: Diagnosis not present

## 2020-09-13 DIAGNOSIS — Z87891 Personal history of nicotine dependence: Secondary | ICD-10-CM | POA: Diagnosis not present

## 2020-09-13 DIAGNOSIS — E785 Hyperlipidemia, unspecified: Secondary | ICD-10-CM | POA: Diagnosis not present

## 2020-09-13 DIAGNOSIS — G479 Sleep disorder, unspecified: Secondary | ICD-10-CM | POA: Diagnosis not present

## 2020-09-13 MED ORDER — HYDROCODONE-ACETAMINOPHEN 5-325 MG PO TABS: 1.0000 | ORAL_TABLET | Freq: Four times a day (QID) | ORAL | 0 refills | Status: DC | PRN

## 2020-09-13 NOTE — Telephone Encounter (Signed)
HHPT called stating Patient states he lost his bottle of oxycodone Pharmacy/insurance will not cover for [place] to give it to him again, maybe if we give him something different He's taking just tylenol for pain per HHPT, and this is not helping much

## 2020-09-13 NOTE — Telephone Encounter (Signed)
Wife aware we called something different in for him

## 2020-09-13 NOTE — Telephone Encounter (Signed)
I sent in some hydrocodone.

## 2020-09-15 DIAGNOSIS — G479 Sleep disorder, unspecified: Secondary | ICD-10-CM | POA: Diagnosis not present

## 2020-09-15 DIAGNOSIS — Z7982 Long term (current) use of aspirin: Secondary | ICD-10-CM | POA: Diagnosis not present

## 2020-09-15 DIAGNOSIS — Z96651 Presence of right artificial knee joint: Secondary | ICD-10-CM | POA: Diagnosis not present

## 2020-09-15 DIAGNOSIS — E785 Hyperlipidemia, unspecified: Secondary | ICD-10-CM | POA: Diagnosis not present

## 2020-09-15 DIAGNOSIS — Z471 Aftercare following joint replacement surgery: Secondary | ICD-10-CM | POA: Diagnosis not present

## 2020-09-15 DIAGNOSIS — K219 Gastro-esophageal reflux disease without esophagitis: Secondary | ICD-10-CM | POA: Diagnosis not present

## 2020-09-15 DIAGNOSIS — N529 Male erectile dysfunction, unspecified: Secondary | ICD-10-CM | POA: Diagnosis not present

## 2020-09-15 DIAGNOSIS — J309 Allergic rhinitis, unspecified: Secondary | ICD-10-CM | POA: Diagnosis not present

## 2020-09-15 DIAGNOSIS — I10 Essential (primary) hypertension: Secondary | ICD-10-CM | POA: Diagnosis not present

## 2020-09-15 DIAGNOSIS — Z87891 Personal history of nicotine dependence: Secondary | ICD-10-CM | POA: Diagnosis not present

## 2020-09-15 DIAGNOSIS — I25119 Atherosclerotic heart disease of native coronary artery with unspecified angina pectoris: Secondary | ICD-10-CM | POA: Diagnosis not present

## 2020-09-17 ENCOUNTER — Telehealth: Payer: Self-pay | Admitting: Orthopaedic Surgery

## 2020-09-17 NOTE — Telephone Encounter (Signed)
Patient called advised the Iceman wrap that keeps his leg cool has stopped working. I advised Thurmond Butts will be contacted concerning the wrap stopped working. The number to contact patient is [phone]

## 2020-09-17 NOTE — Telephone Encounter (Signed)
Called Ryan's cell phone number and left a voicemail concerning the information below. Ask him to call patient to advise, or to cal [place] back and advise.

## 2020-09-23 ENCOUNTER — Ambulatory Visit (INDEPENDENT_AMBULATORY_CARE_PROVIDER_SITE_OTHER): Payer: PPO | Admitting: Orthopaedic Surgery

## 2020-09-23 ENCOUNTER — Other Ambulatory Visit: Payer: Self-pay

## 2020-09-23 ENCOUNTER — Encounter: Payer: Self-pay | Admitting: Orthopaedic Surgery

## 2020-09-23 DIAGNOSIS — Z96651 Presence of right artificial knee joint: Secondary | ICD-10-CM

## 2020-09-23 MED ORDER — HYDROCODONE-ACETAMINOPHEN 5-325 MG PO TABS: 1.0000 | ORAL_TABLET | Freq: Four times a day (QID) | ORAL | 0 refills | Status: DC | PRN

## 2020-09-23 NOTE — Progress Notes (Signed)
The patient is 2 weeks tomorrow status post a right total knee arthroplasty.  He is ambulate with a rolling walker.  He says knee is sore but he is doing well.  Home health therapy has been able to flex him to about 85 degrees on that right knee.  He is an active 80 year old gentleman.  He has been taking a baby aspirin twice a day.  He will go down to just once a day.  There is no calf pain and no swelling in his foot and ankle on the right side.  His knee does have a moderate effusion to be expected.  He lacks full extension by about 3 degrees and I can flex him to 85 degrees.  We will get him set up for outpatient physical therapy probably here at [facility].  I would like to see him back in 4 weeks to see how he is doing overall but no x-rays are needed.  He will go back to just 1 baby aspirin a day which he was on before surgery.  I will send in some more hydrocodone for him as well.

## 2020-09-28 ENCOUNTER — Ambulatory Visit: Payer: PPO | Admitting: Physical Therapy

## 2020-09-28 ENCOUNTER — Other Ambulatory Visit: Payer: Self-pay

## 2020-09-28 ENCOUNTER — Encounter: Payer: Self-pay | Admitting: Physical Therapy

## 2020-09-28 DIAGNOSIS — R2689 Other abnormalities of gait and mobility: Secondary | ICD-10-CM

## 2020-09-28 DIAGNOSIS — M25661 Stiffness of right knee, not elsewhere classified: Secondary | ICD-10-CM | POA: Diagnosis not present

## 2020-09-28 DIAGNOSIS — R600 Localized edema: Secondary | ICD-10-CM

## 2020-09-28 DIAGNOSIS — M25561 Pain in right knee: Secondary | ICD-10-CM

## 2020-09-28 DIAGNOSIS — M6281 Muscle weakness (generalized): Secondary | ICD-10-CM | POA: Diagnosis not present

## 2020-09-28 NOTE — Therapy (Signed)
[facility] Physical Therapy [address] [place], [place], [address] Phone: [phone]   Fax:  [phone]  Physical Therapy Evaluation  Patient Details  Name: Jonathan Griffith MRN: [id number] Date of Birth: Aug 27, 1940 Referring Provider (PT): Mcarthur Rossetti, MD   Encounter Date: 09/28/2020   PT End of Session - 09/28/20 1447    Visit Number 1    Number of Visits 16    Date for PT Re-Evaluation 11/23/20    Authorization Type [employer] $30 copay    Progress Note Due on Visit 10    PT Start Time 1345    PT Stop Time 1425    PT Time Calculation (min) 40 min    Activity Tolerance Patient tolerated treatment well    Behavior During Therapy [facility] for tasks assessed/performed           Past Medical History:  Diagnosis Date  . Allergic rhinitis   . Anginal pain ([place])   . CAD (coronary artery disease)   . Chest pain   . Duodenal ulcer   . Dyspnea    On exertions  . ED (erectile dysfunction)   . GERD (gastroesophageal reflux disease)   . History of colonoscopy   . Hyperlipidemia   . Hypertension   . Osteoarthritis     Past Surgical History:  Procedure Laterality Date  . APPENDECTOMY  1964  . CARDIAC CATHETERIZATION  03/2010   diffuse early disease  . CATARACT EXTRACTION Right 04/18/2016  . INGUINAL HERNIA REPAIR Bilateral 1970   double hernia repair   . LEFT HEART CATH AND CORONARY ANGIOGRAPHY N/A 10/12/2016   Procedure: Left Heart Cath and Coronary Angiography;  Surgeon: Burnell Blanks, MD;  Location: [place] [facility];  Service: Cardiovascular;  Laterality: N/A;  . RETINAL DETACHMENT SURGERY Right 2003  . TONSILLECTOMY    . TOTAL KNEE ARTHROPLASTY Right 09/10/2020   Procedure: RIGHT TOTAL KNEE ARTHROPLASTY;  Surgeon: Mcarthur Rossetti, MD;  Location: [facility];  Service: Orthopedics;  Laterality: Right;    There were no vitals filed for this visit.    Subjective Assessment - 09/28/20 1348    Subjective Pt is an  80 y/o male who presents to OPPT s/p Rt TKA on 09/10/20.  He is amb with [facility] and had 6 HHPT visits.    Limitations Standing;Walking    How long can you stand comfortably? 5-10 min    How long can you walk comfortably? 5-10 min    Patient Stated Goals improve RLE to return to gym and yardwork    Currently in Pain? Yes    Pain Score 2    up to 6/10; at best 0/10   Pain Location Knee    Pain Orientation Right    Pain Descriptors / Indicators Aching    Pain Type Acute pain;Surgical pain    Pain Onset 1 to 4 weeks ago    Pain Frequency Intermittent    Aggravating Factors  bending, prolonged standing/walking    Pain Relieving Factors meds (aleve/tylenol)              [facility] PT Assessment - 09/28/20 1342      Assessment   Medical Diagnosis Z96.651 (ICD-10-CM) - Status post total knee replacement, right    Referring Provider (PT) Mcarthur Rossetti, MD    Onset Date/Surgical Date 09/10/20    Hand Dominance Right    Next MD Visit 10/21/20    Prior Therapy HHPT      Precautions  Precautions Fall      Restrictions   Weight Bearing Restrictions No      Balance Screen   Has the patient fallen in the past 6 months No    Has the patient had a decrease in activity level because of a fear of falling?  No    Is the patient reluctant to leave their home because of a fear of falling?  No      Home Ecologist residence    Living Arrangements Spouse/significant other    Available Help at Discharge Available 24 hours/day    Type of [place] to enter    Entrance Stairs-Number of Steps 2    Entrance Stairs-Rails None    Home Layout One level    [place] - 2 wheels;Cane - single point      Prior Function   Level of Independence Independent   currently needs min A for LB dressing   Vocation Retired    [employer] retired from [employer], go out to eat, gym 3 days/wk (M/W/F @ 5:30 AM) -  rides bike, leg press, machines      Cognition   Overall Cognitive Status Within Functional Limits for tasks assessed      Observation/Other Assessments   Focus on Therapeutic Outcomes (FOTO)  66 (predicted 78)      Observation/Other Assessments-Edema    Edema Circumferential   knee-joint line     Circumferential Edema   Circumferential - Right 16"    Circumferential - Left  15.5"      ROM / Strength   AROM / PROM / Strength AROM;Strength;PROM      AROM   AROM Assessment Site Knee    Right/Left Knee Right;Left    Right Knee Extension -2    Right Knee Flexion 94      PROM   PROM Assessment Site Knee    Right/Left Knee Right    Right Knee Extension 0    Right Knee Flexion 105      Strength   Strength Assessment Site Knee    Right/Left Knee Right    Right Knee Flexion 3-/5    Right Knee Extension 3-/5      Transfers   Five time sit to stand comments  12.31 sec without UE support      Ambulation/Gait   Ambulation/Gait Yes    Ambulation/Gait Assistance 5: Supervision    Ambulation Distance (Feet) 150 Feet    Assistive device Straight cane    Gait Pattern Step-to pattern;Decreased stance time - right;Decreased step length - left;Decreased hip/knee flexion - right;Antalgic                      Objective measurements completed on examination: See above findings.       [facility] Adult PT Treatment/Exercise - 09/28/20 1342      Exercises   Exercises Other Exercises    Other Exercises  see pt instructions - pt performed 3-5 reps of each exercise for demonstration                  PT Education - 09/28/20 1447    Education Details HEP    Person(s) Educated Patient    Methods Explanation;Demonstration;Handout    Comprehension Verbalized understanding;Returned demonstration;Need further instruction            PT Short Term Goals - 09/28/20  Independent [place] #1   Title independent with initial HEP    Status New    Target Date  10/19/20      PT SHORT TERM GOAL #2   Title improve Rt knee AROM 0-105 for improved function    Status New    Target Date 10/19/20             PT Long Term Goals - 09/28/20 1455      PT LONG TERM GOAL #1   Title independent with final HEP including gym program for improved function and mobility    Status New    Target Date 11/23/20      PT LONG TERM GOAL #2   Title improve Rt knee AROM 0-115 for improved mobility    Status New    Target Date 11/23/20      PT LONG TERM GOAL #3   Title FOTO score improved to 78 for improved mobility    Status New    Target Date 11/23/20      PT LONG TERM GOAL #4   Title amb without significant deviations without AD on various surfaces with pain < 2/10    Status New    Target Date 11/23/20      PT LONG TERM GOAL #5   Title negotiate 2 stairs without AD and reciprocal pattern for improved functional strength and mobility    Status New    Target Date 11/23/20                  Plan - 09/28/20 1445    Clinical Impression Statement Pt is an 80 y/o male who presents to OPPT s/p Rt TKA.  He demonstrates decreased ROM and strength as well as gait abnormalities and expected post op pain affecting functional mobility.  Pt will benefit from PT to address deficits listed.    Personal Factors and Comorbidities Comorbidity 2    Comorbidities CAD, HTN    Examination-Activity Limitations Squat;Stairs;Stand;Transfers;Dressing;Locomotion Level    Examination-Participation Restrictions Community Activity;Driving;Yard Work    Stability/Clinical Decision Making Stable/Uncomplicated    Clinical Decision Making Low    Rehab Potential Good    PT Frequency 2x / week    PT Duration 8 weeks    PT Treatment/Interventions ADLs/Self Care Home Management;Cryotherapy;Electrical Stimulation;Moist Heat;Balance training;Therapeutic exercise;Therapeutic activities;Functional mobility training;Stair training;Gait training;Neuromuscular re-education;Patient/family  education;Manual techniques;Vasopneumatic Device;Taping;Dry needling;Passive range of motion;Scar mobilization    PT Next Visit Plan review HEP, work on ROM and initiate strengthening (pt active at [facility] so wants to get back to gym)    PT Home Exercise Plan Access Code: [email or web address]    Consulted and Agree with Plan of Care Patient           Patient will benefit from skilled therapeutic intervention in order to improve the following deficits and impairments:  Abnormal gait,Decreased endurance,Increased edema,Pain,Difficulty walking,Decreased mobility,Decreased balance,Decreased strength,Decreased range of motion,Impaired flexibility  Visit Diagnosis: Acute pain of right knee - Plan: PT plan of care cert/re-cert  Stiffness of right knee, not elsewhere classified - Plan: PT plan of care cert/re-cert  Other abnormalities of gait and mobility - Plan: PT plan of care cert/re-cert  Muscle weakness (generalized) - Plan: PT plan of care cert/re-cert  Localized edema - Plan: PT plan of care cert/re-cert     Problem List Patient Active Problem List   Diagnosis Date Noted  . Status post total right knee replacement 09/10/2020  . Abnormal prominence of clavicle  05/07/2020  . Unilateral primary osteoarthritis, right knee 12/22/2019  . Bilateral calf pain 12/18/2019  . Bilateral knee pain 12/18/2019  . Epigastric pain 02/20/2019  . Sinus bradycardia 11/07/2016  . Sleep disorder 03/24/2016  . Advanced directives, counseling/discussion 05/13/2014  . GERD (gastroesophageal reflux disease)   . Routine general medical examination at a health care facility 05/05/2011  . Coronary artery disease with angina pectoris ([place]) 04/19/2010  . Osteoarthritis of more than one site 03/15/2007  . Hyperlipemia 03/04/2007  . ALLERGIC RHINITIS 03/04/2007      Laureen Abrahams, PT, DPT 09/28/20 3:00 PM    [facility] Physical Therapy [address] [place], [place], [address] Phone:  [phone]   Fax:  [phone]  Name: AMANUEL SINKFIELD MRN: [id number] Date of Birth: 1940/09/05

## 2020-09-28 NOTE — Patient Instructions (Signed)
Access Code: [email or web address] URL: [email or web address] Date: 09/28/2020 Prepared by: Faustino Congress  Exercises Supine Quad Set - 6-7 x daily - 7 x weekly - 1 sets - 5 reps - 5 sec hold Supine Heel Slide with Strap - 6-7 x daily - 7 x weekly - 1 sets - 5 reps - 3-5 sec hold Seated Knee Flexion AAROM - 6-7 x daily - 7 x weekly - 1 sets - 5 reps - 10 sec hold Seated Long Arc Quad - 3-5 x daily - 7 x weekly - 1 sets - 5-10 reps - 3-5 sec hold Sit to Stand - 3-5 x daily - 7 x weekly - 1 sets - 10 reps

## 2020-10-01 ENCOUNTER — Other Ambulatory Visit: Payer: Self-pay

## 2020-10-01 ENCOUNTER — Ambulatory Visit: Payer: PPO | Admitting: Physical Therapy

## 2020-10-01 DIAGNOSIS — M25561 Pain in right knee: Secondary | ICD-10-CM

## 2020-10-01 DIAGNOSIS — M6281 Muscle weakness (generalized): Secondary | ICD-10-CM | POA: Diagnosis not present

## 2020-10-01 DIAGNOSIS — R2689 Other abnormalities of gait and mobility: Secondary | ICD-10-CM

## 2020-10-01 DIAGNOSIS — R600 Localized edema: Secondary | ICD-10-CM | POA: Diagnosis not present

## 2020-10-01 DIAGNOSIS — M25661 Stiffness of right knee, not elsewhere classified: Secondary | ICD-10-CM

## 2020-10-01 NOTE — Therapy (Signed)
[facility] Physical Therapy [address] [place], [place], [address] Phone: [phone]   Fax:  [phone]  Physical Therapy Treatment  Patient Details  Name: Jonathan Griffith MRN: [id number] Date of Birth: 30-Aug-1940 Referring Provider (PT): Mcarthur Rossetti, MD   Encounter Date: 10/01/2020   PT End of Session - 10/01/20 1052    Visit Number 2    Number of Visits 16    Date for PT Re-Evaluation 11/23/20    Authorization Type [employer] $30 copay    Progress Note Due on Visit 10    PT Start Time 1015    PT Stop Time 1103    PT Time Calculation (min) 48 min    Activity Tolerance Patient tolerated treatment well    Behavior During Therapy [facility] for tasks assessed/performed           Past Medical History:  Diagnosis Date  . Allergic rhinitis   . Anginal pain ([place])   . CAD (coronary artery disease)   . Chest pain   . Duodenal ulcer   . Dyspnea    On exertions  . ED (erectile dysfunction)   . GERD (gastroesophageal reflux disease)   . History of colonoscopy   . Hyperlipidemia   . Hypertension   . Osteoarthritis     Past Surgical History:  Procedure Laterality Date  . APPENDECTOMY  1964  . CARDIAC CATHETERIZATION  03/2010   diffuse early disease  . CATARACT EXTRACTION Right 04/18/2016  . INGUINAL HERNIA REPAIR Bilateral 1970   double hernia repair   . LEFT HEART CATH AND CORONARY ANGIOGRAPHY N/A 10/12/2016   Procedure: Left Heart Cath and Coronary Angiography;  Surgeon: Burnell Blanks, MD;  Location: [place] [facility];  Service: Cardiovascular;  Laterality: N/A;  . RETINAL DETACHMENT SURGERY Right 2003  . TONSILLECTOMY    . TOTAL KNEE ARTHROPLASTY Right 09/10/2020   Procedure: RIGHT TOTAL KNEE ARTHROPLASTY;  Surgeon: Mcarthur Rossetti, MD;  Location: [facility];  Service: Orthopedics;  Laterality: Right;    There were no vitals filed for this visit.   Subjective Assessment - 10/01/20 1025    Subjective relays about  2/10 pain in his Rt knee today    Limitations Standing;Walking    How long can you stand comfortably? 5-10 min    How long can you walk comfortably? 5-10 min    Patient Stated Goals improve RLE to return to gym and yardwork    Pain Onset 1 to 4 weeks ago             [facility] Adult PT Treatment/Exercise - 10/01/20 0001      Ambulation/Gait   Ambulation/Gait Yes    Ambulation/Gait Assistance 5: Supervision    Ambulation Distance (Feet) 150 Feet    Assistive device None    Gait Comments progressed to gait without AD today      Exercises   Exercises Knee/Hip      Knee/Hip Exercises: Stretches   Active Hamstring Stretch Left;3 reps;30 seconds    Active Hamstring Stretch Limitations seated    Knee: Self-Stretch Limitations seated tailgate stretch with self O.P 10 sec X 10 reps      Knee/Hip Exercises: Aerobic   Recumbent Bike 10 min rocking for ROM      Knee/Hip Exercises: Machines for Strengthening   Total Gym Leg Press 100 lbs bilat 3X10, then RLE at 37 lbs 2X10      Knee/Hip Exercises: Standing   Forward Step Up Right;20 reps;Hand Hold: 2;Step  Height: 6"      Knee/Hip Exercises: Seated   Long Arc Quad Right;2 sets;10 reps    Long Arc Quad Weight 5 lbs.    Sit to Sand without UE support   slightly raised mat, 15 reps, then 10 reps     Modalities   Modalities Vasopneumatic      Vasopneumatic   Number Minutes Vasopneumatic  10 minutes    Vasopnuematic Location  Knee    Vasopneumatic Pressure Medium    Vasopneumatic Temperature  34      Manual Therapy   Manual therapy comments Rt knee PROM and flexion mobs to tolerance                    PT Short Term Goals - 09/28/20 1455      PT SHORT TERM GOAL #1   Title independent with initial HEP    Status New    Target Date 10/19/20      PT SHORT TERM GOAL #2   Title improve Rt knee AROM 0-105 for improved function    Status New    Target Date 10/19/20             PT Long Term Goals - 09/28/20 1455       PT LONG TERM GOAL #1   Title independent with final HEP including gym program for improved function and mobility    Status New    Target Date 11/23/20      PT LONG TERM GOAL #2   Title improve Rt knee AROM 0-115 for improved mobility    Status New    Target Date 11/23/20      PT LONG TERM GOAL #3   Title FOTO score improved to 78 for improved mobility    Status New    Target Date 11/23/20      PT LONG TERM GOAL #4   Title amb without significant deviations without AD on various surfaces with pain < 2/10    Status New    Target Date 11/23/20      PT LONG TERM GOAL #5   Title negotiate 2 stairs without AD and reciprocal pattern for improved functional strength and mobility    Status New    Target Date 11/23/20                 Plan - 10/01/20 1055    Clinical Impression Statement He is overall doing quite well S/P Rt TKA. He showed good tolerance to PROM and strengthening today without complaints. Continue POC    Personal Factors and Comorbidities Comorbidity 2    Comorbidities CAD, HTN    Examination-Activity Limitations Squat;Stairs;Stand;Transfers;Dressing;Locomotion Level    Examination-Participation Restrictions Community Activity;Driving;Yard Work    Stability/Clinical Decision Making Stable/Uncomplicated    Rehab Potential Good    PT Frequency 2x / week    PT Duration 8 weeks    PT Treatment/Interventions ADLs/Self Care Home Management;Cryotherapy;Electrical Stimulation;Moist Heat;Balance training;Therapeutic exercise;Therapeutic activities;Functional mobility training;Stair training;Gait training;Neuromuscular re-education;Patient/family education;Manual techniques;Vasopneumatic Device;Taping;Dry needling;Passive range of motion;Scar mobilization    PT Next Visit Plan work on ROM and progress gait and strengthening (pt active at [facility] so wants to get back to gym)    PT Home Exercise Plan Access Code: [email or web address]    Consulted and Agree with Plan of Care Patient            Patient will benefit from skilled therapeutic intervention in order to improve the following deficits and impairments:  Abnormal gait,Decreased  endurance,Increased edema,Pain,Difficulty walking,Decreased mobility,Decreased balance,Decreased strength,Decreased range of motion,Impaired flexibility  Visit Diagnosis: Acute pain of right knee  Stiffness of right knee, not elsewhere classified  Other abnormalities of gait and mobility  Muscle weakness (generalized)  Localized edema     Problem List Patient Active Problem List   Diagnosis Date Noted  . Status post total right knee replacement 09/10/2020  . Abnormal prominence of clavicle 05/07/2020  . Unilateral primary osteoarthritis, right knee 12/22/2019  . Bilateral calf pain 12/18/2019  . Bilateral knee pain 12/18/2019  . Epigastric pain 02/20/2019  . Sinus bradycardia 11/07/2016  . Sleep disorder 03/24/2016  . Advanced directives, counseling/discussion 05/13/2014  . GERD (gastroesophageal reflux disease)   . Routine general medical examination at a health care facility 05/05/2011  . Coronary artery disease with angina pectoris ([place]) 04/19/2010  . Osteoarthritis of more than one site 03/15/2007  . Hyperlipemia 03/04/2007  . ALLERGIC RHINITIS 03/04/2007    Debbe Odea, PT,DPT 10/01/2020, 11:01 AM  [facility] Physical Therapy [address] [place], [place], [address] Phone: [phone]   Fax:  [phone]  Name: Jonathan Griffith MRN: [id number] Date of Birth: Aug 06, 1940

## 2020-10-05 ENCOUNTER — Other Ambulatory Visit: Payer: Self-pay

## 2020-10-05 ENCOUNTER — Encounter: Payer: Self-pay | Admitting: Physical Therapy

## 2020-10-05 ENCOUNTER — Ambulatory Visit: Payer: PPO | Admitting: Physical Therapy

## 2020-10-05 DIAGNOSIS — R2689 Other abnormalities of gait and mobility: Secondary | ICD-10-CM | POA: Diagnosis not present

## 2020-10-05 DIAGNOSIS — M6281 Muscle weakness (generalized): Secondary | ICD-10-CM | POA: Diagnosis not present

## 2020-10-05 DIAGNOSIS — M25661 Stiffness of right knee, not elsewhere classified: Secondary | ICD-10-CM

## 2020-10-05 DIAGNOSIS — M25561 Pain in right knee: Secondary | ICD-10-CM | POA: Diagnosis not present

## 2020-10-05 DIAGNOSIS — R600 Localized edema: Secondary | ICD-10-CM

## 2020-10-05 NOTE — Therapy (Signed)
[facility] Therapy [address] [place], [place], [address] Phone: [phone]   Fax:  [phone]  Physical Therapy Treatment  Patient Details  Name: Jonathan Griffith MRN: [id number] Date of Birth: Nov 08, 1940 Referring Provider (PT): Balckman   Encounter Date: 10/05/2020   PT End of Session - 10/05/20 1119    Visit Number 3    Number of Visits 16    Date for PT Re-Evaluation 11/23/20    Authorization Type [employer] $30 copay    Progress Note Due on Visit 10    PT Start Time 1100    PT Stop Time 1145    PT Time Calculation (min) 45 min    Activity Tolerance Patient tolerated treatment well    Behavior During Therapy [facility] for tasks assessed/performed           Past Medical History:  Diagnosis Date  . Allergic rhinitis   . Anginal pain ([place])   . CAD (coronary artery disease)   . Chest pain   . Duodenal ulcer   . Dyspnea    On exertions  . ED (erectile dysfunction)   . GERD (gastroesophageal reflux disease)   . History of colonoscopy   . Hyperlipidemia   . Hypertension   . Osteoarthritis     Past Surgical History:  Procedure Laterality Date  . APPENDECTOMY  1964  . CARDIAC CATHETERIZATION  03/2010   diffuse early disease  . CATARACT EXTRACTION Right 04/18/2016  . INGUINAL HERNIA REPAIR Bilateral 1970   double hernia repair   . LEFT HEART CATH AND CORONARY ANGIOGRAPHY N/A 10/12/2016   Procedure: Left Heart Cath and Coronary Angiography;  Surgeon: Burnell Blanks, MD;  Location: [place] [facility];  Service: Cardiovascular;  Laterality: N/A;  . RETINAL DETACHMENT SURGERY Right 2003  . TONSILLECTOMY    . TOTAL KNEE ARTHROPLASTY Right 09/10/2020   Procedure: RIGHT TOTAL KNEE ARTHROPLASTY;  Surgeon: Mcarthur Rossetti, MD;  Location: [facility];  Service: Orthopedics;  Laterality: Right;    There were no vitals filed for this visit.   Subjective Assessment - 10/05/20 1116    Subjective Pt arriving today reporting  3/10 pain in his right knee.    Limitations Standing;Walking    How long can you stand comfortably? 5-10 min    Patient Stated Goals improve RLE to return to gym and yardwork    Currently in Pain? Yes    Pain Score 3     Pain Location Knee    Pain Orientation Right    Pain Descriptors / Indicators Sore;Aching;Tightness    Pain Type Acute pain;Surgical pain    Pain Onset 1 to 4 weeks ago              [facility] PT Assessment - 10/05/20 0001      Assessment   Medical Diagnosis Z96.951    Referring Provider (PT) Balckman    Onset Date/Surgical Date 09/10/20      AROM   AROM Assessment Site Knee    Right/Left Knee Right    Right Knee Extension -2    Right Knee Flexion 105      PROM   PROM Assessment Site Knee    Right/Left Knee Right    Right Knee Extension 0    Right Knee Flexion 115      Ambulation/Gait   Gait Comments amb with no device  [facility] Adult PT Treatment/Exercise - 10/05/20 0001      Exercises   Exercises Knee/Hip      Knee/Hip Exercises: Stretches   Active Hamstring Stretch Left;3 reps;30 seconds    Active Hamstring Stretch Limitations supine    Knee: Self-Stretch Limitations seated tailgate stretch with self O.P 10 sec X 10 reps      Knee/Hip Exercises: Aerobic   Recumbent Bike seat at 7: began by rocking back and forth and progressed to full revolutions for 10 minutes total      Knee/Hip Exercises: Machines for Strengthening   Total Gym Leg Press 100 lbs bilat 3X10, then RLE at 37 lbs 2X10      Knee/Hip Exercises: Standing   Forward Step Up Right;20 reps;Hand Hold: 2;Step Height: 6"      Knee/Hip Exercises: Seated   Long Arc Quad Right;2 sets;10 reps    Long Arc Quad Weight 5 lbs.    Sit to Sand without UE support   slightly raised mat, 15 reps, then 10 reps     Knee/Hip Exercises: Supine   Straight Leg Raises Strengthening;Right;2 sets;15 reps    Other Supine Knee/Hip Exercises heel slides      Knee/Hip  Exercises: Sidelying   Hip ABduction Strengthening;2 sets;10 reps      Modalities   Modalities Vasopneumatic      Vasopneumatic   Number Minutes Vasopneumatic  10 minutes    Vasopnuematic Location  Knee    Vasopneumatic Pressure Medium    Vasopneumatic Temperature  34      Manual Therapy   Manual therapy comments Rt knee PROM and flexion mobs to tolerance                    PT Short Term Goals - 10/05/20 1124      PT SHORT TERM GOAL #1   Title independent with initial HEP    Status On-going      PT SHORT TERM GOAL #2   Title improve Rt knee AROM 0-105 for improved function    Baseline R knee flexion 105 degrees    Status Achieved             PT Long Term Goals - 10/05/20 1125      PT LONG TERM GOAL #1   Title independent with final HEP including gym program for improved function and mobility    Status On-going      PT LONG TERM GOAL #2   Title improve Rt knee AROM 0-115 for improved mobility    Status On-going      PT LONG TERM GOAL #3   Title FOTO score improved to 78 for improved mobility    Status On-going      PT LONG TERM GOAL #4   Title amb without significant deviations without AD on various surfaces with pain < 2/10    Status On-going      PT LONG TERM GOAL #5   Title negotiate 2 stairs without AD and reciprocal pattern for improved functional strength and mobility    Status On-going                 Plan - 10/05/20 1119    Clinical Impression Statement Pt is tolerating exercises well. Pt even reporting he has been doing more than the intructed frequency of his HEP. We discussed returning to local [employer] for use of the recumbent bike. R knee active flexion today 105 degrees, passive flexion 115 degrees. I added standing  hip extension, hamstring curls, and hip abduction exercises to pt's HEP.  Continue skilled PT progressing with right knee ROM and strengthening.    Personal Factors and Comorbidities Comorbidity 2    Comorbidities CAD,  HTN    Examination-Activity Limitations Squat;Stairs;Stand;Transfers;Dressing;Locomotion Level    Examination-Participation Restrictions Community Activity;Driving;Yard Work    Stability/Clinical Decision Making Stable/Uncomplicated    Rehab Potential Good    PT Frequency 2x / week    PT Duration 8 weeks    PT Treatment/Interventions ADLs/Self Care Home Management;Cryotherapy;Electrical Stimulation;Moist Heat;Balance training;Therapeutic exercise;Therapeutic activities;Functional mobility training;Stair training;Gait training;Neuromuscular re-education;Patient/family education;Manual techniques;Vasopneumatic Device;Taping;Dry needling;Passive range of motion;Scar mobilization    PT Next Visit Plan work on ROM and progress gait and strengthening (pt active at [facility] so wants to get back to gym)    PT Home Exercise Plan Access Code: [email or web address]    Consulted and Agree with Plan of Care Patient           Patient will benefit from skilled therapeutic intervention in order to improve the following deficits and impairments:  Abnormal gait,Decreased endurance,Increased edema,Pain,Difficulty walking,Decreased mobility,Decreased balance,Decreased strength,Decreased range of motion,Impaired flexibility  Visit Diagnosis: Acute pain of right knee  Stiffness of right knee, not elsewhere classified  Other abnormalities of gait and mobility  Muscle weakness (generalized)  Localized edema     Problem List Patient Active Problem List   Diagnosis Date Noted  . Status post total right knee replacement 09/10/2020  . Abnormal prominence of clavicle 05/07/2020  . Unilateral primary osteoarthritis, right knee 12/22/2019  . Bilateral calf pain 12/18/2019  . Bilateral knee pain 12/18/2019  . Epigastric pain 02/20/2019  . Sinus bradycardia 11/07/2016  . Sleep disorder 03/24/2016  . Advanced directives, counseling/discussion 05/13/2014  . GERD (gastroesophageal reflux disease)   . Routine general medical  examination at a health care facility 05/05/2011  . Coronary artery disease with angina pectoris ([place]) 04/19/2010  . Osteoarthritis of more than one site 03/15/2007  . Hyperlipemia 03/04/2007  . ALLERGIC RHINITIS 03/04/2007    Oretha Caprice, PT, MPT 10/05/2020, 11:53 AM  [facility] Physical Therapy [address] [place], [place], [address] Phone: [phone]   Fax:  [phone]  Name: Jonathan Griffith MRN: [id number] Date of Birth: [birth year]

## 2020-10-07 ENCOUNTER — Encounter: Payer: Self-pay | Admitting: Physical Therapy

## 2020-10-07 ENCOUNTER — Other Ambulatory Visit: Payer: Self-pay

## 2020-10-07 ENCOUNTER — Ambulatory Visit: Payer: PPO | Admitting: Physical Therapy

## 2020-10-07 DIAGNOSIS — R600 Localized edema: Secondary | ICD-10-CM

## 2020-10-07 DIAGNOSIS — R2689 Other abnormalities of gait and mobility: Secondary | ICD-10-CM | POA: Diagnosis not present

## 2020-10-07 DIAGNOSIS — M25561 Pain in right knee: Secondary | ICD-10-CM | POA: Diagnosis not present

## 2020-10-07 DIAGNOSIS — M25661 Stiffness of right knee, not elsewhere classified: Secondary | ICD-10-CM

## 2020-10-07 DIAGNOSIS — M6281 Muscle weakness (generalized): Secondary | ICD-10-CM | POA: Diagnosis not present

## 2020-10-07 NOTE — Therapy (Signed)
[facility] Physical Therapy [address] [place], [place], [address] Phone: [phone]   Fax:  [phone]  Physical Therapy Treatment  Patient Details  Name: Jonathan Griffith MRN: [id number] Date of Birth: Oct 07, 1940 Referring Provider (PT): Mcarthur Rossetti, MD   Encounter Date: 10/07/2020   PT End of Session - 10/07/20 1213    Visit Number 4    Number of Visits 16    Date for PT Re-Evaluation 11/23/20    Authorization Type [employer] $30 copay    Progress Note Due on Visit 10    PT Start Time 1132    PT Stop Time 1223    PT Time Calculation (min) 51 min    Activity Tolerance Patient tolerated treatment well    Behavior During Therapy [facility] for tasks assessed/performed           Past Medical History:  Diagnosis Date  . Allergic rhinitis   . Anginal pain ([place])   . CAD (coronary artery disease)   . Chest pain   . Duodenal ulcer   . Dyspnea    On exertions  . ED (erectile dysfunction)   . GERD (gastroesophageal reflux disease)   . History of colonoscopy   . Hyperlipidemia   . Hypertension   . Osteoarthritis     Past Surgical History:  Procedure Laterality Date  . APPENDECTOMY  1964  . CARDIAC CATHETERIZATION  03/2010   diffuse early disease  . CATARACT EXTRACTION Right 04/18/2016  . INGUINAL HERNIA REPAIR Bilateral 1970   double hernia repair   . LEFT HEART CATH AND CORONARY ANGIOGRAPHY N/A 10/12/2016   Procedure: Left Heart Cath and Coronary Angiography;  Surgeon: Burnell Blanks, MD;  Location: [place] [facility];  Service: Cardiovascular;  Laterality: N/A;  . RETINAL DETACHMENT SURGERY Right 2003  . TONSILLECTOMY    . TOTAL KNEE ARTHROPLASTY Right 09/10/2020   Procedure: RIGHT TOTAL KNEE ARTHROPLASTY;  Surgeon: Mcarthur Rossetti, MD;  [facility];  Service: Orthopedics;  Laterality: Right;    There were no vitals filed for this visit.   Subjective Assessment - 10/07/20 1135    Subjective didn't go to  the gym but plans to go    Limitations Standing;Walking    How long can you stand comfortably? 5-10 min    Patient Stated Goals improve RLE to return to gym and yardwork    Currently in Pain? Yes    Pain Score 3     Pain Location Knee    Pain Orientation Right    Pain Descriptors / Indicators Aching;Sore;Tightness    Pain Type Acute pain;Surgical pain    Pain Onset 1 to 4 weeks ago    Pain Frequency Intermittent    Aggravating Factors  bending, prolonged standing/walking    Pain Relieving Factors meds (aleve/tylenol)              [facility] PT Assessment - 10/07/20 0001      Assessment   Medical Diagnosis Z96.651 (ICD-10-CM) - Status post total knee replacement, right    Referring Provider (PT) Mcarthur Rossetti, MD    Onset Date/Surgical Date 09/10/20      PROM   Right Knee Flexion 114                         [facility] Adult PT Treatment/Exercise - 10/07/20 1134      Knee/Hip Exercises: Stretches   Active Hamstring Stretch Left;3 reps;30 seconds    Active  Hamstring Stretch Limitations supine with strap; overpressure at distal thigh    Knee: Self-Stretch Limitations seated tailgate stretch with self O.P 10 sec X 10 reps      Knee/Hip Exercises: Aerobic   Recumbent Bike seat 6 x 8 min; L1      Knee/Hip Exercises: Machines for Strengthening   Cybex Knee Extension 5# 3x10; RLE only    Cybex Knee Flexion 20# 3x10; RLE only    Total Gym Leg Press 100 lbs bilat 3X10, then RLE at 37 lbs 3X10      Knee/Hip Exercises: Supine   Bridges Both;15 reps      Vasopneumatic   Number Minutes Vasopneumatic  10 minutes    Vasopnuematic Location  Knee    Vasopneumatic Pressure Medium    Vasopneumatic Temperature  34      Manual Therapy   Manual therapy comments Rt knee flexion in sitting, extension in supine                    PT Short Term Goals - 10/05/20 1124      PT SHORT TERM GOAL #1   Title independent with initial HEP    Status On-going       PT SHORT TERM GOAL #2   Title improve Rt knee AROM 0-105 for improved function    Baseline R knee flexion 105 degrees    Status Achieved             PT Long Term Goals - 10/05/20 1125      PT LONG TERM GOAL #1   Title independent with final HEP including gym program for improved function and mobility    Status On-going      PT LONG TERM GOAL #2   Title improve Rt knee AROM 0-115 for improved mobility    Status On-going      PT LONG TERM GOAL #3   Title FOTO score improved to 78 for improved mobility    Status On-going      PT LONG TERM GOAL #4   Title amb without significant deviations without AD on various surfaces with pain < 2/10    Status On-going      PT LONG TERM GOAL #5   Title negotiate 2 stairs without AD and reciprocal pattern for improved functional strength and mobility    Status On-going                 Plan - 10/07/20 1214    Clinical Impression Statement Pt continues to demonstrate improvement in ROM and functional mobility.  Progressing well with PT with good ROM and minimal pain with activity. Will continue to benefit from PT to maximize function.    Personal Factors and Comorbidities Comorbidity 2    Comorbidities CAD, HTN    Examination-Activity Limitations Squat;Stairs;Stand;Transfers;Dressing;Locomotion Level    Examination-Participation Restrictions Community Activity;Driving;Yard Work    Stability/Clinical Decision Making Stable/Uncomplicated    Rehab Potential Good    PT Frequency 2x / week    PT Duration 8 weeks    PT Treatment/Interventions ADLs/Self Care Home Management;Cryotherapy;Electrical Stimulation;Moist Heat;Balance training;Therapeutic exercise;Therapeutic activities;Functional mobility training;Stair training;Gait training;Neuromuscular re-education;Patient/family education;Manual techniques;Vasopneumatic Device;Taping;Dry needling;Passive range of motion;Scar mobilization    PT Next Visit Plan work on ROM and progress gait and  strengthening (pt active at [facility] so wants to get back to gym)    PT Home Exercise Plan Access Code: [email or web address]    Consulted and Agree with Plan of Care Patient  Patient will benefit from skilled therapeutic intervention in order to improve the following deficits and impairments:  Abnormal gait,Decreased endurance,Increased edema,Pain,Difficulty walking,Decreased mobility,Decreased balance,Decreased strength,Decreased range of motion,Impaired flexibility  Visit Diagnosis: Acute pain of right knee  Stiffness of right knee, not elsewhere classified  Other abnormalities of gait and mobility  Muscle weakness (generalized)  Localized edema     Problem List Patient Active Problem List   Diagnosis Date Noted  . Status post total right knee replacement 09/10/2020  . Abnormal prominence of clavicle 05/07/2020  . Unilateral primary osteoarthritis, right knee 12/22/2019  . Bilateral calf pain 12/18/2019  . Bilateral knee pain 12/18/2019  . Epigastric pain 02/20/2019  . Sinus bradycardia 11/07/2016  . Sleep disorder 03/24/2016  . Advanced directives, counseling/discussion 05/13/2014  . GERD (gastroesophageal reflux disease)   . Routine general medical examination at a health care facility 05/05/2011  . Coronary artery disease with angina pectoris ([place]) 04/19/2010  . Osteoarthritis of more than one site 03/15/2007  . Hyperlipemia 03/04/2007  . ALLERGIC RHINITIS 03/04/2007      Laureen Abrahams, PT, DPT 10/07/20 12:17 PM    [facility] Physical Therapy [address] [place], [place], [address] Phone: [phone]   Fax:  [phone]  Name: Jonathan Griffith MRN: [id number] Date of Birth: 03-13-1941

## 2020-10-12 ENCOUNTER — Ambulatory Visit: Payer: PPO | Admitting: Physical Therapy

## 2020-10-12 ENCOUNTER — Encounter: Payer: Self-pay | Admitting: Physical Therapy

## 2020-10-12 ENCOUNTER — Other Ambulatory Visit: Payer: Self-pay

## 2020-10-12 DIAGNOSIS — M25661 Stiffness of right knee, not elsewhere classified: Secondary | ICD-10-CM

## 2020-10-12 DIAGNOSIS — R600 Localized edema: Secondary | ICD-10-CM | POA: Diagnosis not present

## 2020-10-12 DIAGNOSIS — R2689 Other abnormalities of gait and mobility: Secondary | ICD-10-CM

## 2020-10-12 DIAGNOSIS — M25561 Pain in right knee: Secondary | ICD-10-CM

## 2020-10-12 DIAGNOSIS — M6281 Muscle weakness (generalized): Secondary | ICD-10-CM

## 2020-10-12 NOTE — Therapy (Signed)
[facility] Physical Therapy [address] [place], [place], [address] Phone: [phone]   Fax:  [phone]  Physical Therapy Treatment  Patient Details  Name: Jonathan Griffith MRN: [id number] Date of Birth: [birth year] Referring Provider (PT): Mcarthur Rossetti, MD   Encounter Date: 10/12/2020   PT End of Session - 10/12/20 1651    Visit Number 5    Number of Visits 16    Date for PT Re-Evaluation 11/23/20    Authorization Type [employer] $30 copay    Progress Note Due on Visit 10    PT Start Time 1345    PT Stop Time 1445    PT Time Calculation (min) 60 min    Activity Tolerance Patient tolerated treatment well    Behavior During Therapy [facility] for tasks assessed/performed           Past Medical History:  Diagnosis Date  . Allergic rhinitis   . Anginal pain ([place])   . CAD (coronary artery disease)   . Chest pain   . Duodenal ulcer   . Dyspnea    On exertions  . ED (erectile dysfunction)   . GERD (gastroesophageal reflux disease)   . History of colonoscopy   . Hyperlipidemia   . Hypertension   . Osteoarthritis     Past Surgical History:  Procedure Laterality Date  . APPENDECTOMY  1964  . CARDIAC CATHETERIZATION  03/2010   diffuse early disease  . CATARACT EXTRACTION Right 04/18/2016  . INGUINAL HERNIA REPAIR Bilateral 1970   double hernia repair   . LEFT HEART CATH AND CORONARY ANGIOGRAPHY N/A 10/12/2016   Procedure: Left Heart Cath and Coronary Angiography;  Surgeon: Burnell Blanks, MD;  Location: [place] [facility];  Service: Cardiovascular;  Laterality: N/A;  . RETINAL DETACHMENT SURGERY Right 2003  . TONSILLECTOMY    . TOTAL KNEE ARTHROPLASTY Right 09/10/2020   Procedure: RIGHT TOTAL KNEE ARTHROPLASTY;  Surgeon: Mcarthur Rossetti, MD;  [facility];  Service: Orthopedics;  Laterality: Right;    There were no vitals filed for this visit.   Subjective Assessment - 10/12/20 1350    Subjective He has been  doing his exercises.  He has not gone back to [facility] yet.    Limitations Standing;Walking    How long can you stand comfortably? 5-10 min    Patient Stated Goals improve RLE to return to gym and yardwork    Currently in Pain? Yes    Pain Score 6    since last PT appt, ranging from 1/10 to 6/10   Pain Location Knee    Pain Orientation Right    Pain Descriptors / Indicators Aching;Sore    Pain Type Surgical pain;Acute pain    Pain Onset 1 to 4 weeks ago    Pain Frequency Intermittent    Aggravating Factors  first stand up, walking too far    Pain Relieving Factors tylenol, ice,                             [facility] Adult PT Treatment/Exercise - 10/12/20 1345      Knee/Hip Exercises: Stretches   Active Hamstring Stretch Right;3 reps;30 seconds    Active Hamstring Stretch Limitations supine with strap; overpressure at distal thigh    Quad Stretch Right;2 reps;30 seconds    Quad Stretch Limitations supine LE over edge w/strap to flex knee    Knee: Self-Stretch Limitations --    Press photographer  Both;2 reps;30 seconds    Gastroc Stretch Limitations slant board      Knee/Hip Exercises: Aerobic   Recumbent Bike seat 6 x 8 min; L1      Knee/Hip Exercises: Machines for Strengthening   Cybex Knee Extension 5# 3x10; RLE only    Cybex Knee Flexion 20# 3x10; RLE only    Total Gym Leg Press 100 lbs bilat 3X10, then RLE at 37 lbs 3X10      Knee/Hip Exercises: Standing   Terminal Knee Extension Strengthening;Right;2 sets;10 reps;Theraband    Theraband Level (Terminal Knee Extension) Level 3 (Green)    Terminal Knee Extension Limitations 1st set active resisted motion terminal ext.  2nd set stabilization hold RLE while tapping LLE on cone      Knee/Hip Exercises: Supine   Bridges Both;15 reps      Vasopneumatic   Number Minutes Vasopneumatic  10 minutes    Vasopnuematic Location  Knee    Vasopneumatic Pressure Medium    Vasopneumatic Temperature  34      Manual Therapy    Manual therapy comments Rt knee flexion in sitting, extension in supine                    PT Short Term Goals - 10/05/20 1124      PT SHORT TERM GOAL #1   Title independent with initial HEP    Status On-going      PT SHORT TERM GOAL #2   Title improve Rt knee AROM 0-105 for improved function    Baseline R knee flexion 105 degrees    Status Achieved             PT Long Term Goals - 10/05/20 1125      PT LONG TERM GOAL #1   Title independent with final HEP including gym program for improved function and mobility    Status On-going      PT LONG TERM GOAL #2   Title improve Rt knee AROM 0-115 for improved mobility    Status On-going      PT LONG TERM GOAL #3   Title FOTO score improved to 78 for improved mobility    Status On-going      PT LONG TERM GOAL #4   Title amb without significant deviations without AD on various surfaces with pain < 2/10    Status On-going      PT LONG TERM GOAL #5   Title negotiate 2 stairs without AD and reciprocal pattern for improved functional strength and mobility    Status On-going                 Plan - 10/12/20 1355    Clinical Impression Statement PT session focused on increasing functional strength.  PT discussed options for returning to [facility] and verbalized understanding.    Personal Factors and Comorbidities Comorbidity 2    Comorbidities CAD, HTN    Examination-Activity Limitations Squat;Stairs;Stand;Transfers;Dressing;Locomotion Level    Examination-Participation Restrictions Community Activity;Driving;Yard Work    Stability/Clinical Decision Making Stable/Uncomplicated    Rehab Potential Good    PT Frequency 2x / week    PT Duration 8 weeks    PT Treatment/Interventions ADLs/Self Care Home Management;Cryotherapy;Electrical Stimulation;Moist Heat;Balance training;Therapeutic exercise;Therapeutic activities;Functional mobility training;Stair training;Gait training;Neuromuscular re-education;Patient/family  education;Manual techniques;Vasopneumatic Device;Taping;Dry needling;Passive range of motion;Scar mobilization    PT Next Visit Plan work on ROM and progress gait and strengthening (pt active at [facility] so wants to get back to gym)  PT Home Exercise Plan Access Code: [email or web address]    Consulted and Agree with Plan of Care Patient           Patient will benefit from skilled therapeutic intervention in order to improve the following deficits and impairments:  Abnormal gait,Decreased endurance,Increased edema,Pain,Difficulty walking,Decreased mobility,Decreased balance,Decreased strength,Decreased range of motion,Impaired flexibility  Visit Diagnosis: Acute pain of right knee  Stiffness of right knee, not elsewhere classified  Other abnormalities of gait and mobility  Muscle weakness (generalized)  Localized edema     Problem List Patient Active Problem List   Diagnosis Date Noted  . Status post total right knee replacement 09/10/2020  . Abnormal prominence of clavicle 05/07/2020  . Unilateral primary osteoarthritis, right knee 12/22/2019  . Bilateral calf pain 12/18/2019  . Bilateral knee pain 12/18/2019  . Epigastric pain 02/20/2019  . Sinus bradycardia 11/07/2016  . Sleep disorder 03/24/2016  . Advanced directives, counseling/discussion 05/13/2014  . GERD (gastroesophageal reflux disease)   . Routine general medical examination at a health care facility 05/05/2011  . Coronary artery disease with angina pectoris ([place]) 04/19/2010  . Osteoarthritis of more than one site 03/15/2007  . Hyperlipemia 03/04/2007  . ALLERGIC RHINITIS 03/04/2007    Jamey Reas, PT, DPT 10/12/2020, 4:55 PM  [facility] Physical Therapy [address] [place], [place], [address] Phone: [phone]   Fax:  [phone]  Name: Jonathan Griffith MRN: [id number] Date of Birth: Apr 27, 1941

## 2020-10-14 ENCOUNTER — Other Ambulatory Visit: Payer: Self-pay

## 2020-10-14 ENCOUNTER — Ambulatory Visit: Payer: PPO | Admitting: Physical Therapy

## 2020-10-14 DIAGNOSIS — R600 Localized edema: Secondary | ICD-10-CM

## 2020-10-14 DIAGNOSIS — M6281 Muscle weakness (generalized): Secondary | ICD-10-CM

## 2020-10-14 DIAGNOSIS — R2689 Other abnormalities of gait and mobility: Secondary | ICD-10-CM | POA: Diagnosis not present

## 2020-10-14 DIAGNOSIS — M25661 Stiffness of right knee, not elsewhere classified: Secondary | ICD-10-CM

## 2020-10-14 DIAGNOSIS — M25561 Pain in right knee: Secondary | ICD-10-CM | POA: Diagnosis not present

## 2020-10-14 NOTE — Therapy (Signed)
[facility] [address] [place], [place], [address] Phone: [phone]   Fax:  [phone]  Physical Therapy Treatment  Patient Details  Name: Jonathan Griffith MRN: [id number] Date of Birth: October 21, 1940 Referring Provider (PT): Mcarthur Rossetti, MD   Encounter Date: 10/14/2020   PT End of Session - 10/14/20 1414    Visit Number 6    Number of Visits 16    Date for PT Re-Evaluation 11/23/20    Authorization Type [employer] $30 copay    Progress Note Due on Visit 10    PT Start Time 1345    PT Stop Time 1428    PT Time Calculation (min) 43 min    Activity Tolerance Patient tolerated treatment well    Behavior During Therapy [facility] for tasks assessed/performed           Past Medical History:  Diagnosis Date  . Allergic rhinitis   . Anginal pain ([place])   . CAD (coronary artery disease)   . Chest pain   . Duodenal ulcer   . Dyspnea    On exertions  . ED (erectile dysfunction)   . GERD (gastroesophageal reflux disease)   . History of colonoscopy   . Hyperlipidemia   . Hypertension   . Osteoarthritis     Past Surgical History:  Procedure Laterality Date  . APPENDECTOMY  1964  . CARDIAC CATHETERIZATION  03/2010   diffuse early disease  . CATARACT EXTRACTION Right 04/18/2016  . INGUINAL HERNIA REPAIR Bilateral 1970   double hernia repair   . LEFT HEART CATH AND CORONARY ANGIOGRAPHY N/A 10/12/2016   Procedure: Left Heart Cath and Coronary Angiography;  Surgeon: Burnell Blanks, MD;  Location: [facility];  Service: Cardiovascular;  Laterality: N/A;  . RETINAL DETACHMENT SURGERY Right 2003  . TONSILLECTOMY    . TOTAL KNEE ARTHROPLASTY Right 09/10/2020   Procedure: RIGHT TOTAL KNEE ARTHROPLASTY;  Surgeon: Mcarthur Rossetti, MD;  Location: [facility];  Service: Orthopedics;  Laterality: Right;    There were no vitals filed for this visit.   Subjective Assessment - 10/14/20 1354    Subjective relays his  knee is overall good, stiffness when he first goes to stand up. Denies significant pain today    Limitations Standing;Walking    How long can you stand comfortably? 5-10 min    How long can you walk comfortably? 5-10 min    Patient Stated Goals improve RLE to return to gym and yardwork    Currently in Pain? No/denies    Pain Onset 1 to 4 weeks ago             [facility] Adult PT Treatment/Exercise - 10/14/20 0001      Knee/Hip Exercises: Stretches   Gastroc Stretch Both;3 reps;30 seconds      Knee/Hip Exercises: Aerobic   Recumbent Bike seat 6 x 8 min; L1      Knee/Hip Exercises: Machines for Strengthening   Cybex Knee Extension 5# 3x10; RLE only    Cybex Knee Flexion 25# 3x10; RLE only    Total Gym Leg Press 106 lbs bilat 3X10, then RLE at 37 lbs 3X10      Knee/Hip Exercises: Standing   Other Standing Knee Exercises up/down stairs in hall with one UE support reciprocally X 2 (does have to turn sideways some due to discomfort knee flexion limition)      Knee/Hip Exercises: Seated   Sit to Sand 2 sets;10 reps;without UE support   holding  5 lbs     Vasopneumatic   Number Minutes Vasopneumatic  10 minutes    Vasopnuematic Location  Knee    Vasopneumatic Pressure Medium;High    Vasopneumatic Temperature  34      Manual Therapy   Manual therapy comments Rt knee flexion and exension PROM to tolerance                    PT Short Term Goals - 10/05/20 1124      PT SHORT TERM GOAL #1   Title independent with initial HEP    Status On-going      PT SHORT TERM GOAL #2   Title improve Rt knee AROM 0-105 for improved function    Baseline R knee flexion 105 degrees    Status Achieved             PT Long Term Goals - 10/05/20 1125      PT LONG TERM GOAL #1   Title independent with final HEP including gym program for improved function and mobility    Status On-going      PT LONG TERM GOAL #2   Title improve Rt knee AROM 0-115 for improved mobility    Status  On-going      PT LONG TERM GOAL #3   Title FOTO score improved to 78 for improved mobility    Status On-going      PT LONG TERM GOAL #4   Title amb without significant deviations without AD on various surfaces with pain < 2/10    Status On-going      PT LONG TERM GOAL #5   Title negotiate 2 stairs without AD and reciprocal pattern for improved functional strength and mobility    Status On-going                 Plan - 10/14/20 1420    Clinical Impression Statement Worked on strengthening focus today and he has good tolerance to this. Now able to negotiate 6 inch stairs reciprocally but does still have some difficulty and discomfort descending steps. ROM progressing well. Continue POC    Personal Factors and Comorbidities Comorbidity 2    Comorbidities CAD, HTN    Examination-Activity Limitations Squat;Stairs;Stand;Transfers;Dressing;Locomotion Level    Examination-Participation Restrictions Community Activity;Driving;Yard Work    Stability/Clinical Decision Making Stable/Uncomplicated    Rehab Potential Good    PT Frequency 2x / week    PT Duration 8 weeks    PT Treatment/Interventions ADLs/Self Care Home Management;Cryotherapy;Electrical Stimulation;Moist Heat;Balance training;Therapeutic exercise;Therapeutic activities;Functional mobility training;Stair training;Gait training;Neuromuscular re-education;Patient/family education;Manual techniques;Vasopneumatic Device;Taping;Dry needling;Passive range of motion;Scar mobilization    PT Next Visit Plan work on ROM and progress gait and strengthening (pt active at [facility] so wants to get back to gym)    PT Home Exercise Plan Access Code: [email or web address]    Consulted and Agree with Plan of Care Patient           Patient will benefit from skilled therapeutic intervention in order to improve the following deficits and impairments:  Abnormal gait,Decreased endurance,Increased edema,Pain,Difficulty walking,Decreased mobility,Decreased  balance,Decreased strength,Decreased range of motion,Impaired flexibility  Visit Diagnosis: Acute pain of right knee  Stiffness of right knee, not elsewhere classified  Other abnormalities of gait and mobility  Muscle weakness (generalized)  Localized edema     Problem List Patient Active Problem List   Diagnosis Date Noted  . Status post total right knee replacement 09/10/2020  . Abnormal prominence of clavicle 05/07/2020  . Unilateral  primary osteoarthritis, right knee 12/22/2019  . Bilateral calf pain 12/18/2019  . Bilateral knee pain 12/18/2019  . Epigastric pain 02/20/2019  . Sinus bradycardia 11/07/2016  . Sleep disorder 03/24/2016  . Advanced directives, counseling/discussion 05/13/2014  . GERD (gastroesophageal reflux disease)   . Routine general medical examination at a health care facility 05/05/2011  . Coronary artery disease with angina pectoris ([place]) 04/19/2010  . Osteoarthritis of more than one site 03/15/2007  . Hyperlipemia 03/04/2007  . ALLERGIC RHINITIS 03/04/2007    Silvestre Mesi 10/14/2020, 2:24 PM  [facility] [address] [place], [place], [address] Phone: [phone]   Fax:  [phone]  Name: RAYSHAWN MANEY MRN: [id number] Date of Birth: 1941/06/04

## 2020-10-15 ENCOUNTER — Encounter: Payer: Self-pay | Admitting: Internal Medicine

## 2020-10-15 ENCOUNTER — Ambulatory Visit (INDEPENDENT_AMBULATORY_CARE_PROVIDER_SITE_OTHER): Payer: PPO | Admitting: Internal Medicine

## 2020-10-15 ENCOUNTER — Other Ambulatory Visit: Payer: Self-pay

## 2020-10-15 VITALS — BP 102/68 | HR 55 | Temp 97.6°F | Ht 65.5 in | Wt 166.0 lb

## 2020-10-15 DIAGNOSIS — K219 Gastro-esophageal reflux disease without esophagitis: Secondary | ICD-10-CM | POA: Diagnosis not present

## 2020-10-15 DIAGNOSIS — G479 Sleep disorder, unspecified: Secondary | ICD-10-CM

## 2020-10-15 DIAGNOSIS — I25119 Atherosclerotic heart disease of native coronary artery with unspecified angina pectoris: Secondary | ICD-10-CM

## 2020-10-15 DIAGNOSIS — Z7189 Other specified counseling: Secondary | ICD-10-CM

## 2020-10-15 DIAGNOSIS — Z0000 Encounter for general adult medical examination without abnormal findings: Secondary | ICD-10-CM | POA: Diagnosis not present

## 2020-10-15 NOTE — Assessment & Plan Note (Signed)
See social history 

## 2020-10-15 NOTE — Progress Notes (Signed)
Subjective:    Patient ID: Jonathan Griffith, male    DOB: Dec 14, 1940, 80 y.o.   MRN: [id number]  HPI Here for Medicare wellness visit and follow up of chronic health conditions This visit occurred during the SARS-CoV-2 public health emergency.  Safety protocols were in place, including screening questions prior to the visit, additional usage of staff PPE, and extensive cleaning of exam room while observing appropriate contact time as indicated for disinfecting solutions.   Reviewed form and advanced directives Reviewed other doctors No recent alcohol since surgery---usually enjoys one occasionally No tobacco Vision is fine Hearing is good No falls No depression or anhedonia Independent with instrumental ADLs No apparent memory problems  Recovered fairly well from the right TKR-- 5 weeks ago Walking without device Still going for therapy twice a week Has restarted driving  [facility]  Notes his feet stay cold now Goes back 6 months No pain No problems with walking  Breathing is okay Not able to walk up the hill---that would get him dyspneic No chest pain Will have brief dizziness if he bends down and gets up quick No edema now--used stockings post op  Current Outpatient Medications on File Prior to Visit  Medication Sig Dispense Refill  . ALPHAGAN P 0.1 % SOLN Place 2 drops into the right eye daily.    Marland Kitchen aspirin 81 MG chewable tablet Chew 1 tablet (81 mg total) by mouth 2 (two) times daily. (Patient taking differently: Chew 81 mg by mouth once.) 30 tablet 0  . Cholecalciferol (VITAMIN D-3) 125 MCG (5000 UT) TABS Take 5,000 Units by mouth at bedtime.    Marland Kitchen HYDROcodone-acetaminophen (NORCO/VICODIN) 5-325 MG tablet Take 1-2 tablets by mouth every 6 (six) hours as needed for moderate pain. 30 tablet 0  . ILEVRO 0.3 % ophthalmic suspension Place 1 drop into the right eye at bedtime.    Marland Kitchen losartan (COZAAR) 50 MG tablet TAKE 1 TABLET BY MOUTH EVERY DAY (Patient  taking differently: Take 50 mg by mouth at bedtime.) 90 tablet 3  . Multiple Vitamins-Minerals (MULTIVITAMIN WITH MINERALS) tablet Take 1 tablet by mouth at bedtime.    . nitroGLYCERIN (NITROSTAT) 0.4 MG SL tablet Place 1 tablet (0.4 mg total) under the tongue every 5 (five) minutes as needed for chest pain (x 3 doses). 25 tablet 6  . omeprazole (PRILOSEC) 20 MG capsule TAKE 1 CAPSULE (20 MG TOTAL) BY MOUTH DAILY. ON AN EMPTY STOMACH (Patient taking differently: Take 20 mg by mouth at bedtime. On an empty stomach) 90 capsule 3  . prednisoLONE acetate (PRED FORTE) 1 % ophthalmic suspension Place 2 drops into the right eye daily.    . simvastatin (ZOCOR) 40 MG tablet TAKE ONE TABLET BY MOUTH EACH EVENING (Patient taking differently: Take 40 mg by mouth at bedtime.) 90 tablet 3  . vitamin B-12 (CYANOCOBALAMIN) 500 MCG tablet Take 500 mcg by mouth daily.     No current facility-administered medications on file prior to visit.    Allergies  Allergen Reactions  . Ibuprofen     REACTION: ulcer    Past Medical History:  Diagnosis Date  . Allergic rhinitis   . Anginal pain ([place])   . CAD (coronary artery disease)   . Chest pain   . Duodenal ulcer   . Dyspnea    On exertions  . ED (erectile dysfunction)   . GERD (gastroesophageal reflux disease)   . History of colonoscopy   . Hyperlipidemia   .  Hypertension   . Osteoarthritis     Past Surgical History:  Procedure Laterality Date  . APPENDECTOMY  1964  . CARDIAC CATHETERIZATION  03/2010   diffuse early disease  . CATARACT EXTRACTION Right 04/18/2016  . INGUINAL HERNIA REPAIR Bilateral 1970   double hernia repair   . LEFT HEART CATH AND CORONARY ANGIOGRAPHY N/A 10/12/2016   Procedure: Left Heart Cath and Coronary Angiography;  Surgeon: Burnell Blanks, MD;  Location: [facility];  Service: Cardiovascular;  Laterality: N/A;  . RETINAL DETACHMENT SURGERY Right 2003  . TONSILLECTOMY    . TOTAL KNEE ARTHROPLASTY Right  09/10/2020   Procedure: RIGHT TOTAL KNEE ARTHROPLASTY;  Surgeon: Mcarthur Rossetti, MD;  Location: [facility];  Service: Orthopedics;  Laterality: Right;    Family History  Problem Relation Age of Onset  . Coronary artery disease Mother        CABG in 24's  . Brain cancer Mother        that was metastaticfrom lung   . Stomach cancer Father 5  . Diabetes Sister   . Stroke Maternal Grandmother     Social History   Socioeconomic History  . Marital status: Married    Spouse name: Not on file  . Number of children: 2  . Years of education: Not on file  . Highest education level: Not on file  Occupational History  . Occupation: Administrator, Civil Service    Comment: retired  Tobacco Use  . Smoking status: Former Smoker    Packs/day: 1.00    Years: 10.00    Pack years: 10.00    Types: Cigars    Quit date: 07/18/2007    Years since quitting: 13.2  . Smokeless tobacco: Never Used  . Tobacco comment: Occ cigar - no cigarettes - stopped in 6-09  Vaping Use  . Vaping Use: Never used  Substance and Sexual Activity  . Alcohol use: Yes    Alcohol/week: 1.0 standard drink    Types: 1 Standard drinks or equivalent per week    Comment: 3-4 drinks per week  . Drug use: No  . Sexual activity: Not on file  Other Topics Concern  . Not on file  Social History Narrative   Has living will   Wife would serve as health care POA--then son   Would accept resuscitation attempts but no prolonged artificial ventilation    No tube feeds if cognitively unaware   Social Determinants of Health   Financial Resource Strain: Not on file  Food Insecurity: Not on file  Transportation Needs: Not on file  Physical Activity: Not on file  Stress: Not on file  Social Connections: Not on file  Intimate Partner Violence: Not on file   Review of Systems Appetite is off since the surgery Has lost a few pounds Sleep is still not great---night awakening, then eventually dozes back off No sig daytime  somnolence Wears okay Full dentures--no dentist No suspicious skin lesions No heartburn. No dysphagia Bowels are slow--but improving now. No blood Voids fine--flow is good. Nocturia at times No other significant joint pains    Objective:   Physical Exam Constitutional:      Appearance: Normal appearance.  HENT:     Mouth/Throat:     Comments: No lesions Eyes:     Conjunctiva/sclera: Conjunctivae normal.     Comments: Left exotropia  Cardiovascular:     Rate and Rhythm: Normal rate and regular rhythm.     Heart sounds: No murmur heard. No  gallop.      Comments: Faint pedal pulses Pulmonary:     Effort: Pulmonary effort is normal.     Breath sounds: Normal breath sounds. No wheezing or rales.  Abdominal:     Palpations: Abdomen is soft.     Tenderness: There is no abdominal tenderness.  Musculoskeletal:     Cervical back: Neck supple.     Right lower leg: No edema.     Left lower leg: No edema.  Lymphadenopathy:     Cervical: No cervical adenopathy.  Skin:    General: Skin is warm.     Findings: No rash.  Neurological:     Mental Status: He is alert and oriented to person, place, and time.     Comments: President---"Biden, Trump, Obama" [name] Recall 3/3  Psychiatric:        Mood and Affect: Mood normal.        Behavior: Behavior normal.            Assessment & Plan:

## 2020-10-15 NOTE — Assessment & Plan Note (Signed)
I have personally reviewed the Medicare Annual Wellness questionnaire and have noted 1. The patient's medical and social history 2. Their use of alcohol, tobacco or illicit drugs 3. Their current medications and supplements 4. The patient's functional ability including ADL's, fall risks, home safety risks and hearing or visual             impairment. 5. Diet and physical activities 6. Evidence for depression or mood disorders  The patients weight, height, BMI and visual acuity have been recorded in the chart I have made referrals, counseling and provided education to the patient based review of the above and I have provided the pt with a written personalized care plan for preventive services.  I have provided you with a copy of your personalized plan for preventive services. Please take the time to review along with your updated medication list.  No cancer screening due to age [facility] Had COVID booster---get the next one later in the year Flu vaccine in the fall

## 2020-10-15 NOTE — Progress Notes (Signed)
Hearing Screening   125Hz  250Hz  500Hz  1000Hz  2000Hz  3000Hz  4000Hz  6000Hz  8000Hz   Right ear:   20 20 20   40    Left ear:   20 20 20  20     Vision Screening Comments: December 2021

## 2020-10-15 NOTE — Assessment & Plan Note (Signed)
Chronic problems Not excited about medications for this

## 2020-10-15 NOTE — Assessment & Plan Note (Signed)
Controlled with the omeprazole

## 2020-10-15 NOTE — Assessment & Plan Note (Signed)
No recent DOE since not able to walk as much Continues on ASA, statin, ARB

## 2020-10-18 ENCOUNTER — Ambulatory Visit: Payer: PPO | Admitting: Physical Therapy

## 2020-10-18 ENCOUNTER — Encounter: Payer: Self-pay | Admitting: Physical Therapy

## 2020-10-18 ENCOUNTER — Other Ambulatory Visit: Payer: Self-pay

## 2020-10-18 DIAGNOSIS — M25661 Stiffness of right knee, not elsewhere classified: Secondary | ICD-10-CM | POA: Diagnosis not present

## 2020-10-18 DIAGNOSIS — R600 Localized edema: Secondary | ICD-10-CM | POA: Diagnosis not present

## 2020-10-18 DIAGNOSIS — R2689 Other abnormalities of gait and mobility: Secondary | ICD-10-CM | POA: Diagnosis not present

## 2020-10-18 DIAGNOSIS — M6281 Muscle weakness (generalized): Secondary | ICD-10-CM | POA: Diagnosis not present

## 2020-10-18 DIAGNOSIS — M25561 Pain in right knee: Secondary | ICD-10-CM

## 2020-10-18 NOTE — Therapy (Signed)
[facility] [address] [place], [place], [address] Phone: [phone]   Fax:  [phone]  Physical Therapy Treatment  Patient Details  Name: Jonathan Griffith MRN: [id number] Date of Birth: [birth year] Referring Provider (PT): Mcarthur Rossetti, MD   Encounter Date: 10/18/2020   PT End of Session - 10/18/20 1224    Visit Number 7    Number of Visits 16    Date for PT Re-Evaluation 11/23/20    Authorization Type [employer] $30 copay    Progress Note Due on Visit 10    PT Start Time 1140    PT Stop Time 1230    PT Time Calculation (min) 50 min    Activity Tolerance Patient tolerated treatment well    Behavior During Therapy [facility] for tasks assessed/performed           Past Medical History:  Diagnosis Date  . Allergic rhinitis   . Anginal pain ([place])   . CAD (coronary artery disease)   . Chest pain   . Duodenal ulcer   . Dyspnea    On exertions  . ED (erectile dysfunction)   . GERD (gastroesophageal reflux disease)   . History of colonoscopy   . Hyperlipidemia   . Hypertension   . Osteoarthritis     Past Surgical History:  Procedure Laterality Date  . APPENDECTOMY  1964  . CARDIAC CATHETERIZATION  03/2010   diffuse early disease  . CATARACT EXTRACTION Right 04/18/2016  . INGUINAL HERNIA REPAIR Bilateral 1970   double hernia repair   . LEFT HEART CATH AND CORONARY ANGIOGRAPHY N/A 10/12/2016   Procedure: Left Heart Cath and Coronary Angiography;  Surgeon: Burnell Blanks, MD;  Location: [facility];  Service: Cardiovascular;  Laterality: N/A;  . RETINAL DETACHMENT SURGERY Right 2003  . TONSILLECTOMY    . TOTAL KNEE ARTHROPLASTY Right 09/10/2020   Procedure: RIGHT TOTAL KNEE ARTHROPLASTY;  Surgeon: Mcarthur Rossetti, MD;  Location: [facility];  Service: Orthopedics;  Laterality: Right;    There were no vitals filed for this visit.   Subjective Assessment - 10/18/20 1142    Subjective knee is a  little sore today. "I think I've been sitting too much."    Limitations Standing;Walking    How long can you stand comfortably? 5-10 min    How long can you walk comfortably? 5-10 min    Patient Stated Goals improve RLE to return to gym and yardwork    Currently in Pain? Yes    Pain Score 3     Pain Location Knee    Pain Orientation Right    Pain Descriptors / Indicators Aching;Sore    Pain Type Acute pain;Surgical pain    Pain Onset 1 to 4 weeks ago    Pain Frequency Intermittent    Aggravating Factors  first standing up, walking too far, sitting too much    Pain Relieving Factors tylenol, ice              [facility] PT Assessment - 10/18/20 1205      Assessment   Medical Diagnosis Z96.651 (ICD-10-CM) - Status post total knee replacement, right    Referring Provider (PT) Mcarthur Rossetti, MD    Onset Date/Surgical Date 09/10/20    Next MD Visit 10/21/20      AROM   Right Knee Extension 0    Right Knee Flexion 117  [facility] Adult PT Treatment/Exercise - 10/18/20 1141      Knee/Hip Exercises: Aerobic   Recumbent Bike seat 6 x 8 min; L3      Knee/Hip Exercises: Machines for Strengthening   Cybex Knee Extension 5# 3x10; RLE only    Cybex Knee Flexion 25# 3x10; RLE only    Total Gym Leg Press 112 lbs bilat 3X10, then RLE at 43 lbs 3X10      Knee/Hip Exercises: Standing   Lateral Step Up Both;10 reps;Hand Hold: 0;Step Height: 6"    Forward Step Up Both;10 reps;Hand Hold: 0;Step Height: 6"    SLS RLE on compliant surface with intermittent UE support      Knee/Hip Exercises: Seated   Sit to Sand 2 sets;10 reps;without UE support   low mat table     Knee/Hip Exercises: Supine   Quad Sets Right;20 reps    Quad Sets Limitations before measuring extension    Heel Slides Right;AAROM;20 reps    Heel Slides Limitations before measuring flexion                    PT Short Term Goals - 10/18/20 1224      PT SHORT TERM GOAL #1    Title independent with initial HEP    Status Achieved      PT SHORT TERM GOAL #2   Title improve Rt knee AROM 0-105 for improved function    Baseline R knee flexion 105 degrees    Status Achieved             PT Long Term Goals - 10/18/20 1224      PT LONG TERM GOAL #1   Title independent with final HEP including gym program for improved function and mobility    Status On-going    Target Date 11/23/20      PT LONG TERM GOAL #2   Title improve Rt knee AROM 0-115 for improved mobility    Status Achieved      PT LONG TERM GOAL #3   Title FOTO score improved to 78 for improved mobility    Status On-going      PT LONG TERM GOAL #4   Title amb without significant deviations without AD on various surfaces with pain < 2/10    Status On-going      PT LONG TERM GOAL #5   Title negotiate 2 stairs without AD and reciprocal pattern for improved functional strength and mobility    Status On-going                 Plan - 10/18/20 1224    Clinical Impression Statement Pt demonstrating great progress with PT meeting ROM LTG at this time.  He goes to the MD later this will so will further assess goals before that visit.    Personal Factors and Comorbidities Comorbidity 2    Comorbidities CAD, HTN    Examination-Activity Limitations Squat;Stairs;Stand;Transfers;Dressing;Locomotion Level    Examination-Participation Restrictions Community Activity;Driving;Yard Work    Stability/Clinical Decision Making Stable/Uncomplicated    Rehab Potential Good    PT Frequency 2x / week    PT Duration 8 weeks    PT Treatment/Interventions ADLs/Self Care Home Management;Cryotherapy;Electrical Stimulation;Moist Heat;Balance training;Therapeutic exercise;Therapeutic activities;Functional mobility training;Stair training;Gait training;Neuromuscular re-education;Patient/family education;Manual techniques;Vasopneumatic Device;Taping;Dry needling;Passive range of motion;Scar mobilization    PT Next  Visit Plan check LTGs, get FOTO, needs MD note    PT Home Exercise Plan Access Code: [email or web address]    Consulted and Agree  with Plan of Care Patient           Patient will benefit from skilled therapeutic intervention in order to improve the following deficits and impairments:  Abnormal gait,Decreased endurance,Increased edema,Pain,Difficulty walking,Decreased mobility,Decreased balance,Decreased strength,Decreased range of motion,Impaired flexibility  Visit Diagnosis: Acute pain of right knee  Stiffness of right knee, not elsewhere classified  Other abnormalities of gait and mobility  Muscle weakness (generalized)  Localized edema     Problem List Patient Active Problem List   Diagnosis Date Noted  . Status post total right knee replacement 09/10/2020  . Unilateral primary osteoarthritis, right knee 12/22/2019  . Bilateral knee pain 12/18/2019  . Sinus bradycardia 11/07/2016  . Sleep disorder 03/24/2016  . Advanced directives, counseling/discussion 05/13/2014  . GERD (gastroesophageal reflux disease)   . Routine general medical examination at a health care facility 05/05/2011  . Coronary artery disease with angina pectoris ([place]) 04/19/2010  . Osteoarthritis of more than one site 03/15/2007  . Hyperlipemia 03/04/2007  . ALLERGIC RHINITIS 03/04/2007      Laureen Abrahams, PT, DPT 10/18/20 12:26 PM    [facility] [address] [place], [place], [address] Phone: [phone]   Fax:  [phone]  Name: Jonathan Griffith MRN: [id number] Date of Birth: September 24, 1940

## 2020-10-21 ENCOUNTER — Other Ambulatory Visit: Payer: Self-pay

## 2020-10-21 ENCOUNTER — Ambulatory Visit: Payer: PPO | Admitting: Physical Therapy

## 2020-10-21 ENCOUNTER — Encounter: Payer: Self-pay | Admitting: Orthopaedic Surgery

## 2020-10-21 ENCOUNTER — Ambulatory Visit (INDEPENDENT_AMBULATORY_CARE_PROVIDER_SITE_OTHER): Payer: PPO | Admitting: Orthopaedic Surgery

## 2020-10-21 DIAGNOSIS — R600 Localized edema: Secondary | ICD-10-CM

## 2020-10-21 DIAGNOSIS — M25561 Pain in right knee: Secondary | ICD-10-CM

## 2020-10-21 DIAGNOSIS — M6281 Muscle weakness (generalized): Secondary | ICD-10-CM

## 2020-10-21 DIAGNOSIS — M25661 Stiffness of right knee, not elsewhere classified: Secondary | ICD-10-CM

## 2020-10-21 DIAGNOSIS — Z96651 Presence of right artificial knee joint: Secondary | ICD-10-CM

## 2020-10-21 DIAGNOSIS — R2689 Other abnormalities of gait and mobility: Secondary | ICD-10-CM

## 2020-10-21 NOTE — Progress Notes (Signed)
The patient is an 80 year old gentleman who is now 6 weeks status post a right total knee arthroplasty.  He is doing very well overall and is pleased with his range of motion and strength.  He is ready to be released from physical therapy.  His extension is full on his right knee and his flexion is to 120 degrees.  It is ligamentously stable.  There is moderate swelling to be expected.  Overall he is walking without assistive device and doing well.  I agree with him being released from outpatient physical therapy and just home exercise program.  I would like to see him back in 6 months unless there are issues.  At that visit I like an AP and lateral of the right knee.

## 2020-10-21 NOTE — Therapy (Addendum)
[facility] [address] [place], [place], [address] Phone: [phone]   Fax:  [phone]  Physical Therapy Treatment/Discharge PHYSICAL THERAPY DISCHARGE SUMMARY  Visits from Start of Care: 8  Current functional level related to goals / functional outcomes: See below   Remaining deficits: See below   Education / Equipment: HEP Plan: Patient agrees to discharge.  Patient goals were met. Patient is being discharged due to meeting the stated rehab goals.  ?????       Patient Details  Name: Jonathan Griffith MRN: [id number] Date of Birth: 01/20/1941 Referring Provider (PT): Mcarthur Rossetti, MD   Encounter Date: 10/21/2020   PT End of Session - 10/21/20 1423    Visit Number 8    Number of Visits 16    Date for PT Re-Evaluation 11/23/20    Authorization Type [employer] $30 copay    Progress Note Due on Visit 10    PT Start Time 1338    PT Stop Time 1430    PT Time Calculation (min) 52 min    Activity Tolerance Patient tolerated treatment well    Behavior During Therapy [facility] for tasks assessed/performed           Past Medical History:  Diagnosis Date  . Allergic rhinitis   . Anginal pain ([place])   . CAD (coronary artery disease)   . Chest pain   . Duodenal ulcer   . Dyspnea    On exertions  . ED (erectile dysfunction)   . GERD (gastroesophageal reflux disease)   . History of colonoscopy   . Hyperlipidemia   . Hypertension   . Osteoarthritis     Past Surgical History:  Procedure Laterality Date  . APPENDECTOMY  1964  . CARDIAC CATHETERIZATION  03/2010   diffuse early disease  . CATARACT EXTRACTION Right 04/18/2016  . INGUINAL HERNIA REPAIR Bilateral 1970   double hernia repair   . LEFT HEART CATH AND CORONARY ANGIOGRAPHY N/A 10/12/2016   Procedure: Left Heart Cath and Coronary Angiography;  Surgeon: Burnell Blanks, MD;  Location: [place] [facility];  Service: Cardiovascular;  Laterality: N/A;   . RETINAL DETACHMENT SURGERY Right 2003  . TONSILLECTOMY    . TOTAL KNEE ARTHROPLASTY Right 09/10/2020   Procedure: RIGHT TOTAL KNEE ARTHROPLASTY;  Surgeon: Mcarthur Rossetti, MD;  Location: [facility];  Service: Orthopedics;  Laterality: Right;    There were no vitals filed for this visit.   Subjective Assessment - 10/21/20 1347    Subjective My knee is doing well, about 1/10 overall pain.    Limitations Standing;Walking    How long can you stand comfortably? 5-10 min    How long can you walk comfortably? 5-10 min    Patient Stated Goals improve RLE to return to gym and yardwork    Pain Onset 1 to 4 weeks ago              [facility] PT Assessment - 10/21/20 0001      Assessment   Medical Diagnosis Z96.651 (ICD-10-CM) - Status post total knee replacement, right    Referring Provider (PT) Mcarthur Rossetti, MD    Onset Date/Surgical Date 09/10/20    Next MD Visit 10/21/20      AROM   Right Knee Extension -2    Right Knee Flexion 110      PROM   Right Knee Extension 0    Right Knee Flexion 120      Strength   Right  Knee Flexion 5/5    Right Knee Extension 5/5            [facility] Adult PT Treatment/Exercise - 10/21/20 0001      Knee/Hip Exercises: Stretches   Knee: Self-Stretch Limitations seated tailgate stretch with self O.P 10 sec X 10 reps    Gastroc Stretch Both;3 reps;30 seconds      Knee/Hip Exercises: Aerobic   Recumbent Bike seat 6 x 10 min; L3      Knee/Hip Exercises: Machines for Strengthening   Cybex Knee Extension 5# 3x10; RLE only    Cybex Knee Flexion 25# 3x10; RLE only    Total Gym Leg Press 112 lbs bilat 3X10, then RLE at 50 lbs 3X10      Knee/Hip Exercises: Standing   Lateral Step Up Both;10 reps;Hand Hold: 0;Step Height: 6"    Forward Step Up Both;10 reps;Hand Hold: 0;Step Height: 6"    SLS RLE on compliant surface with intermittent UE support    Other Standing Knee Exercises Rt hip ext and abd red band X15 ea      Vasopneumatic   Number  Minutes Vasopneumatic  10 minutes    Vasopnuematic Location  Knee    Vasopneumatic Pressure Medium;High    Vasopneumatic Temperature  34      Manual Therapy   Manual therapy comments Rt knee flexion and exension PROM to tolerance                    PT Short Term Goals - 10/18/20 1224      PT SHORT TERM GOAL #1   Title independent with initial HEP    Status Achieved      PT SHORT TERM GOAL #2   Title improve Rt knee AROM 0-105 for improved function    Baseline R knee flexion 105 degrees    Status Achieved             PT Long Term Goals - 10/21/20 1426      PT LONG TERM GOAL #1   Title independent with final HEP including gym program for improved function and mobility    Status Achieved      PT LONG TERM GOAL #2   Title improve Rt knee AROM 0-115 for improved mobility    Baseline 2-110 today but PROM 0-120    Status Partially Met      PT LONG TERM GOAL #3   Title FOTO score improved to 78 for improved mobility    Baseline decreased to 60% but he admits to answeing the running question wrong on his first day.    Status On-going      PT LONG TERM GOAL #4   Title amb without significant deviations without AD on various surfaces with pain < 2/10    Status Achieved      PT LONG TERM GOAL #5   Title negotiate 2 stairs without AD and reciprocal pattern for improved functional strength and mobility    Status Achieved                 Plan - 10/21/20 1430    Clinical Impression Statement He has done really well with PT post op Rt TKA. He is only missing very little knee ext ROM and very mild strength. He has met all goals at this time except FOTO goal which decreased today but he admits to error last time that he answered the running question wrong. He had follow up with MD today  who was pleased and released him. He will be discharged today and he had no further questions or concerns.   Personal Factors and Comorbidities Comorbidity 2    Comorbidities CAD,  HTN    Examination-Activity Limitations Squat;Stairs;Stand;Transfers;Dressing;Locomotion Level    Examination-Participation Restrictions Community Activity;Driving;Yard Work    Stability/Clinical Decision Making Stable/Uncomplicated    Rehab Potential Good    PT Frequency 2x / week    PT Duration 8 weeks    PT Treatment/Interventions ADLs/Self Care Home Management;Cryotherapy;Electrical Stimulation;Moist Heat;Balance training;Therapeutic exercise;Therapeutic activities;Functional mobility training;Stair training;Gait training;Neuromuscular re-education;Patient/family education;Manual techniques;Vasopneumatic Device;Taping;Dry needling;Passive range of motion;Scar mobilization    PT Next Visit Plan what did MD say    PT Home Exercise Plan Access Code: [email or web address]    Consulted and Agree with Plan of Care Patient           Patient will benefit from skilled therapeutic intervention in order to improve the following deficits and impairments:  Abnormal gait,Decreased endurance,Increased edema,Pain,Difficulty walking,Decreased mobility,Decreased balance,Decreased strength,Decreased range of motion,Impaired flexibility  Visit Diagnosis: Acute pain of right knee  Stiffness of right knee, not elsewhere classified  Other abnormalities of gait and mobility  Muscle weakness (generalized)  Localized edema     Problem List Patient Active Problem List   Diagnosis Date Noted  . Status post total right knee replacement 09/10/2020  . Unilateral primary osteoarthritis, right knee 12/22/2019  . Bilateral knee pain 12/18/2019  . Sinus bradycardia 11/07/2016  . Sleep disorder 03/24/2016  . Advanced directives, counseling/discussion 05/13/2014  . GERD (gastroesophageal reflux disease)   . Routine general medical examination at a health care facility 05/05/2011  . Coronary artery disease with angina pectoris ([place]) 04/19/2010  . Osteoarthritis of more than one site 03/15/2007  . Hyperlipemia  03/04/2007  . ALLERGIC RHINITIS 03/04/2007    Silvestre Mesi 10/21/2020, 2:33 PM  [facility] [address] [place], [place], [address] Phone: [phone]   Fax:  [phone]  Name: Jonathan Griffith MRN: [id number] Date of Birth: Dec 22, 1940

## 2020-10-25 ENCOUNTER — Encounter: Payer: PPO | Admitting: Physical Therapy

## 2020-10-27 ENCOUNTER — Encounter: Payer: PPO | Admitting: Physical Therapy

## 2020-10-28 ENCOUNTER — Other Ambulatory Visit: Payer: Self-pay | Admitting: Internal Medicine

## 2020-11-01 ENCOUNTER — Encounter: Payer: PPO | Admitting: Physical Therapy

## 2020-11-03 ENCOUNTER — Encounter: Payer: PPO | Admitting: Physical Therapy

## 2020-11-08 ENCOUNTER — Encounter: Payer: PPO | Admitting: Physical Therapy

## 2020-11-10 ENCOUNTER — Encounter: Payer: PPO | Admitting: Physical Therapy

## 2020-11-23 ENCOUNTER — Encounter (INDEPENDENT_AMBULATORY_CARE_PROVIDER_SITE_OTHER): Payer: PPO | Admitting: Ophthalmology

## 2020-11-23 ENCOUNTER — Other Ambulatory Visit: Payer: Self-pay

## 2020-11-23 DIAGNOSIS — H33103 Unspecified retinoschisis, bilateral: Secondary | ICD-10-CM | POA: Diagnosis not present

## 2020-11-23 DIAGNOSIS — H338 Other retinal detachments: Secondary | ICD-10-CM

## 2020-11-23 DIAGNOSIS — H35373 Puckering of macula, bilateral: Secondary | ICD-10-CM

## 2020-11-23 DIAGNOSIS — H43813 Vitreous degeneration, bilateral: Secondary | ICD-10-CM | POA: Diagnosis not present

## 2020-11-23 DIAGNOSIS — H35033 Hypertensive retinopathy, bilateral: Secondary | ICD-10-CM | POA: Diagnosis not present

## 2020-11-23 DIAGNOSIS — I10 Essential (primary) hypertension: Secondary | ICD-10-CM

## 2020-11-23 DIAGNOSIS — H59033 Cystoid macular edema following cataract surgery, bilateral: Secondary | ICD-10-CM

## 2021-01-25 ENCOUNTER — Other Ambulatory Visit: Payer: Self-pay | Admitting: Internal Medicine

## 2021-02-05 ENCOUNTER — Other Ambulatory Visit: Payer: Self-pay | Admitting: Internal Medicine

## 2021-02-24 DIAGNOSIS — H35353 Cystoid macular degeneration, bilateral: Secondary | ICD-10-CM | POA: Diagnosis not present

## 2021-02-24 DIAGNOSIS — H35373 Puckering of macula, bilateral: Secondary | ICD-10-CM | POA: Diagnosis not present

## 2021-02-24 DIAGNOSIS — H524 Presbyopia: Secondary | ICD-10-CM | POA: Diagnosis not present

## 2021-02-24 DIAGNOSIS — H40051 Ocular hypertension, right eye: Secondary | ICD-10-CM | POA: Diagnosis not present

## 2021-02-24 DIAGNOSIS — H40013 Open angle with borderline findings, low risk, bilateral: Secondary | ICD-10-CM | POA: Diagnosis not present

## 2021-03-04 DIAGNOSIS — L578 Other skin changes due to chronic exposure to nonionizing radiation: Secondary | ICD-10-CM | POA: Diagnosis not present

## 2021-03-04 DIAGNOSIS — Z85828 Personal history of other malignant neoplasm of skin: Secondary | ICD-10-CM | POA: Diagnosis not present

## 2021-03-04 DIAGNOSIS — D485 Neoplasm of uncertain behavior of skin: Secondary | ICD-10-CM | POA: Diagnosis not present

## 2021-03-04 DIAGNOSIS — L814 Other melanin hyperpigmentation: Secondary | ICD-10-CM | POA: Diagnosis not present

## 2021-03-04 DIAGNOSIS — L570 Actinic keratosis: Secondary | ICD-10-CM | POA: Diagnosis not present

## 2021-03-04 DIAGNOSIS — L821 Other seborrheic keratosis: Secondary | ICD-10-CM | POA: Diagnosis not present

## 2021-03-28 ENCOUNTER — Encounter (INDEPENDENT_AMBULATORY_CARE_PROVIDER_SITE_OTHER): Payer: PPO | Admitting: Ophthalmology

## 2021-03-28 ENCOUNTER — Other Ambulatory Visit: Payer: Self-pay

## 2021-03-28 DIAGNOSIS — H35033 Hypertensive retinopathy, bilateral: Secondary | ICD-10-CM

## 2021-03-28 DIAGNOSIS — H43813 Vitreous degeneration, bilateral: Secondary | ICD-10-CM | POA: Diagnosis not present

## 2021-03-28 DIAGNOSIS — H59033 Cystoid macular edema following cataract surgery, bilateral: Secondary | ICD-10-CM | POA: Diagnosis not present

## 2021-03-28 DIAGNOSIS — I10 Essential (primary) hypertension: Secondary | ICD-10-CM

## 2021-03-28 DIAGNOSIS — H338 Other retinal detachments: Secondary | ICD-10-CM

## 2021-04-21 ENCOUNTER — Encounter: Payer: Self-pay | Admitting: Orthopaedic Surgery

## 2021-04-21 ENCOUNTER — Ambulatory Visit (INDEPENDENT_AMBULATORY_CARE_PROVIDER_SITE_OTHER): Payer: PPO

## 2021-04-21 ENCOUNTER — Ambulatory Visit: Payer: PPO | Admitting: Orthopaedic Surgery

## 2021-04-21 DIAGNOSIS — Z96651 Presence of right artificial knee joint: Secondary | ICD-10-CM | POA: Diagnosis not present

## 2021-04-21 NOTE — Progress Notes (Signed)
The patient comes in today 8 months status post a right total knee replacement.  He is 80 years old.  We replaced his knee with press-fit implants.  He says he is doing well overall.  He still has some soreness and some swelling but he denies any instability.  He said there is some slight numbness on the lateral aspect of his knee.  Examination of his right operative knee just shows some mild swelling.  His extension and flexion are full and the knee is loosely stable.  His incision is healed nicely.  There is some slight decrease in sensation laterally and I told him about the anatomy of the knee and that this is to be expected and hopefully will better with time.  All questions and concerns were answered and addressed.  At this point follow-up can be as needed since he is doing so well.  If there is any issues he knows to let us know and come see [place].

## 2021-06-03 ENCOUNTER — Other Ambulatory Visit: Payer: Self-pay

## 2021-06-03 ENCOUNTER — Ambulatory Visit: Payer: PPO | Admitting: Cardiovascular Disease

## 2021-06-03 ENCOUNTER — Encounter: Payer: Self-pay | Admitting: Cardiovascular Disease

## 2021-06-03 VITALS — BP 134/72 | HR 52 | Ht 67.0 in | Wt 187.0 lb

## 2021-06-03 DIAGNOSIS — R001 Bradycardia, unspecified: Secondary | ICD-10-CM

## 2021-06-03 DIAGNOSIS — I10 Essential (primary) hypertension: Secondary | ICD-10-CM | POA: Diagnosis not present

## 2021-06-03 DIAGNOSIS — R0602 Shortness of breath: Secondary | ICD-10-CM

## 2021-06-03 DIAGNOSIS — I25119 Atherosclerotic heart disease of native coronary artery with unspecified angina pectoris: Secondary | ICD-10-CM

## 2021-06-03 DIAGNOSIS — E7800 Pure hypercholesterolemia, unspecified: Secondary | ICD-10-CM | POA: Diagnosis not present

## 2021-06-03 MED ORDER — NITROGLYCERIN 0.4 MG SL SUBL: 0.4000 mg | SUBLINGUAL_TABLET | SUBLINGUAL | 3 refills | Status: DC | PRN

## 2021-06-03 NOTE — Patient Instructions (Signed)
Medication Instructions:  Your physician recommends that you continue on your current medications as directed. Please refer to the Current Medication list given to you today.  *If you need a refill on your cardiac medications before your next appointment, please call your pharmacy*   Testing/Procedures: Your physician has requested that you have an echocardiogram. Echocardiography is a painless test that uses sound waves to create images of your heart. It provides your doctor with information about the size and shape of your heart and how well your heart's chambers and valves are working. This procedure takes approximately one hour. There are no restrictions for this procedure.    Follow-Up: At [facility], you and your health needs are our priority.  As part of our continuing mission to provide you with exceptional heart care, we have created designated Provider Care Teams.  These Care Teams include your primary Cardiologist (physician) and Advanced Practice Providers (APPs -  Physician Assistants and Nurse Practitioners) who all work together to provide you with the care you need, when you need it.  We recommend signing up for the patient portal called "MyChart".  Sign up information is provided on this After Visit Summary.  MyChart is used to connect with patients for Virtual Visits (Telemedicine).  Patients are able to view lab/test results, encounter notes, upcoming appointments, etc.  Non-urgent messages can be sent to your provider as well.   To learn more about what you can do with MyChart, go to [email or web address].    Your next appointment:   1 year(s)  The format for your next appointment:   In Person  Provider:   Lauree Chandler, MD

## 2021-06-03 NOTE — Progress Notes (Signed)
Chief Complaint  Patient presents with   Follow-up    CAD    History of Present Illness: 80 yo male with history of CAD, hyperlipidemia and GERD who is here today for cardiac follow up. He is known to have mild to moderate CAD with last cath in March 2018 showing 40% ostial LAD stenosis and mild disease in the RCA and intermediate Slawinski. LV function was normal. He has not tolerated Imdur in the past. He had bradycardia with Bystolic so it was stopped. He has been seen in the Pulmonary clinic for workup of dyspnea and is not felt to have significant lung disease. He has described mild chest pain with heavy exertion for years. He did not tolerate Imdur and has not been willing to try Ranexa.   He is here today for follow up. The patient denies any palpitations, lower extremity edema, orthopnea, PND, dizziness, near syncope or syncope. He is having some dyspnea with exertion. Stable chest pain with moderate to heavy exertion.    Primary Care Physician: Venia Carbon, MD  Past Medical History:  Diagnosis Date   Allergic rhinitis    Anginal pain ([facility])    CAD (coronary artery disease)    Chest pain    Duodenal ulcer    Dyspnea    On exertions   ED (erectile dysfunction)    GERD (gastroesophageal reflux disease)    History of colonoscopy    Hyperlipidemia    Hypertension    Osteoarthritis     Past Surgical History:  Procedure Laterality Date   APPENDECTOMY  1964   CARDIAC CATHETERIZATION  03/2010   diffuse early disease   CATARACT EXTRACTION Right 04/18/2016   INGUINAL HERNIA REPAIR Bilateral 1970   double hernia repair    LEFT HEART CATH AND CORONARY ANGIOGRAPHY N/A 10/12/2016   Procedure: Left Heart Cath and Coronary Angiography;  Surgeon: Burnell Blanks, MD;  Location: [place] [facility];  Service: Cardiovascular;  Laterality: N/A;   RETINAL DETACHMENT SURGERY Right 2003   TONSILLECTOMY     TOTAL KNEE ARTHROPLASTY Right 09/10/2020   Procedure: RIGHT TOTAL KNEE  ARTHROPLASTY;  Surgeon: Mcarthur Rossetti, MD;  Location: [facility];  Service: Orthopedics;  Laterality: Right;    Current Outpatient Medications  Medication Sig Dispense Refill   ALPHAGAN P 0.1 % SOLN Place 2 drops into the right eye daily.     aspirin EC 81 MG tablet Take 81 mg by mouth daily. Swallow whole.     Cholecalciferol (VITAMIN D-3) 125 MCG (5000 UT) TABS Take 5,000 Units by mouth at bedtime.     ILEVRO 0.3 % ophthalmic suspension Place 1 drop into the right eye at bedtime.     losartan (COZAAR) 50 MG tablet TAKE 1 TABLET BY MOUTH EVERY DAY 90 tablet 3   Multiple Vitamins-Minerals (MULTIVITAMIN WITH MINERALS) tablet Take 1 tablet by mouth at bedtime.     omeprazole (PRILOSEC) 20 MG capsule TAKE 1 CAPSULE (20 MG TOTAL) BY MOUTH DAILY. ON AN EMPTY STOMACH (Patient taking differently: Take 20 mg by mouth daily as needed. On an empty stomach) 90 capsule 3   prednisoLONE acetate (PRED FORTE) 1 % ophthalmic suspension Place 2 drops into the right eye daily.     simvastatin (ZOCOR) 40 MG tablet TAKE ONE TABLET BY MOUTH EACH EVENING 90 tablet 3   vitamin B-12 (CYANOCOBALAMIN) 500 MCG tablet Take 500 mcg by mouth daily.     nitroGLYCERIN (NITROSTAT) 0.4 MG SL tablet Place  1 tablet (0.4 mg total) under the tongue every 5 (five) minutes as needed for chest pain (x 3 doses). 25 tablet 3   No current facility-administered medications for this visit.    Allergies  Allergen Reactions   Ibuprofen     REACTION: ulcer    Social History   Socioeconomic History   Marital status: Married    Spouse name: Not on file   Number of children: 2   Years of education: Not on file   Highest education level: Not on file  Occupational History   Occupation: Administrator, [employer] Service    Comment: retired  Tobacco Use   Smoking status: Former    Packs/day: 1.00    Years: 10.00    Pack years: 10.00    Types: Cigars, Cigarettes    Quit date: 07/18/2007    Years since quitting: 13.8   Smokeless tobacco: Never    Tobacco comments:    Occ cigar - no cigarettes - stopped in 6-09  Vaping Use   Vaping Use: Never used  Substance and Sexual Activity   Alcohol use: Yes    Alcohol/week: 1.0 standard drink    Types: 1 Standard drinks or equivalent per week    Comment: 3-4 drinks per week   Drug use: No   Sexual activity: Not on file  Other Topics Concern   Not on file  Social History Narrative   Has living will   Wife would serve as health care POA--then son   Would accept resuscitation attempts but no prolonged artificial ventilation    No tube feeds if cognitively unaware   Social Determinants of Health   Financial Resource Strain: Not on file  Food Insecurity: Not on file  Transportation Needs: Not on file  Physical Activity: Not on file  Stress: Not on file  Social Connections: Not on file  Intimate Partner Violence: Not on file    Family History  Problem Relation Age of Onset   Coronary artery disease Mother        CABG in 62's   Brain cancer Mother         that was metastatic  from lung    Stomach cancer Father 73   Diabetes Sister    Stroke Maternal Grandmother     Review of Systems:  As stated in the HPI and otherwise negative.   BP 134/72   Pulse (!) 52   Ht 5\' 7"  (1.702 m)   Wt 187 lb (84.8 kg)   SpO2 97%   BMI 29.29 kg/m   Physical Examination:  General: Well developed, well nourished, NAD  HEENT: OP clear, mucus membranes moist  SKIN: warm, dry. No rashes. Neuro: No focal deficits  Musculoskeletal: Muscle strength 5/5 all ext  Psychiatric: Mood and affect normal  Neck: No JVD, no carotid bruits, no thyromegaly, no lymphadenopathy.  Lungs:Clear bilaterally, no wheezes, rhonci, crackles Cardiovascular: Regular rate and rhythm. No murmurs, gallops or rubs. Abdomen:Soft. Bowel sounds present. Non-tender.  Extremities: No lower extremity edema. Pulses are 2 + in the bilateral DP/PT.  EKG:  EKG is ordered today. The ekg ordered today demonstrates sinus  bradycardia, rate 48 bpm  Recent Labs: 09/11/2020: BUN 19; Creatinine, Ser 0.94; Hemoglobin 12.4; Platelets 218; Potassium 4.3; Sodium 139   Lipid Panel    Component Value Date/Time   CHOL 144 10/10/2019 0804   TRIG 105.0 10/10/2019 0804   HDL 44.70 10/10/2019 0804   CHOLHDL 3 10/10/2019 0804   VLDL 21.0 10/10/2019 0804  LDLCALC 79 10/10/2019 0804   LDLDIRECT 131.3 04/06/2009 0922     Wt Readings from Last 3 Encounters:  06/03/21 187 lb (84.8 kg)  10/15/20 166 lb (75.3 kg)  09/10/20 177 lb (80.3 kg)     Other studies Reviewed: Additional studies/ records that were reviewed today include: . Review of the above records demonstrates:    Assessment and Plan:   1. CAD with stable angina:  He has chronic chest pain. No recent changes. His angina is felt to be due to microvascular disease. Cardiac cath in March 2018 with stable mild CAD. No beta blocker due to bradycardia in past while on beta blockers.  He never tried Ranexa. He did not tolerate Imdur. Will continue ASA and statin.  2. HTN: BP is well controlled. No changes today.  3. Hyperlipidemia: Lipids followed in primary care. He will have labs next month in primary care. Continue statin.   4. Sinus Bradycardia: No symptoms related to this.   5 Dyspnea: Will arrange echo to assess LVEF, exclude valve disease. No volume overload on exam.   Current medicines are reviewed at length with the patient today.  The patient does not have concerns regarding medicines.  The following changes have been made:  no change  Labs/ tests ordered today include:   Orders Placed This Encounter  Procedures   EKG 12-Lead   ECHOCARDIOGRAM COMPLETE     Disposition:   F/U with me in 12 months.    Signed, Lauree Chandler, MD 06/03/2021 4:24 PM    [facility] [place], [place], [place]  [address] Phone: [phone]; Fax: [phone]

## 2021-06-27 ENCOUNTER — Other Ambulatory Visit: Payer: Self-pay

## 2021-06-27 ENCOUNTER — Ambulatory Visit (HOSPITAL_COMMUNITY): Payer: PPO | Attending: Cardiology

## 2021-06-27 DIAGNOSIS — R001 Bradycardia, unspecified: Secondary | ICD-10-CM | POA: Diagnosis not present

## 2021-06-27 DIAGNOSIS — E7800 Pure hypercholesterolemia, unspecified: Secondary | ICD-10-CM | POA: Diagnosis not present

## 2021-06-27 DIAGNOSIS — I10 Essential (primary) hypertension: Secondary | ICD-10-CM | POA: Diagnosis not present

## 2021-06-27 DIAGNOSIS — I25119 Atherosclerotic heart disease of native coronary artery with unspecified angina pectoris: Secondary | ICD-10-CM

## 2021-06-27 DIAGNOSIS — R0602 Shortness of breath: Secondary | ICD-10-CM | POA: Diagnosis not present

## 2021-06-27 LAB — ECHOCARDIOGRAM COMPLETE: S' Lateral: 2.6 cm

## 2021-07-15 ENCOUNTER — Telehealth: Payer: Self-pay

## 2021-07-15 DIAGNOSIS — S2232XA Fracture of one rib, left side, initial encounter for closed fracture: Secondary | ICD-10-CM | POA: Diagnosis not present

## 2021-07-15 DIAGNOSIS — R0789 Other chest pain: Secondary | ICD-10-CM | POA: Diagnosis not present

## 2021-07-15 DIAGNOSIS — W010XXA Fall on same level from slipping, tripping and stumbling without subsequent striking against object, initial encounter: Secondary | ICD-10-CM | POA: Diagnosis not present

## 2021-07-15 NOTE — Telephone Encounter (Signed)
I spoke with pt; pt said he is not going to ED but pt already has appt to be seen at [facility] on [address] [place] this afternoon. Ed precautions given and pt voiced understanding. Sending FYI note to Dr Silvio Pate as PCP who is out of office today and Dutch Quint FNP who is covering for Dr Silvio Pate today and Larene Beach CMA.

## 2021-07-15 NOTE — Telephone Encounter (Signed)
Received all from Access nurse they have triaged and advised that he go to ED. Patient refused to go to ED. He has been having rib pain on right side. He has pain and trouble with breathing at time. He has been doing other daily activities ok other than fast moving or turning over. He did loose consciousness and that was what caused fall. His wife was with him at time of fall and was only out for a few seconds. Did not hit head. Patient advised that we do not have any openings and that he needs to have evaluation. Has agreed to go to urgent care after funeral today. Reviewed all red words to patient will go to ED or call 911 if any.

## 2021-07-15 NOTE — Telephone Encounter (Signed)
Please see notes below this access nurse note.    [place] Day - Client TELEPHONE ADVICE RECORD AccessNurse Patient Name: Jonathan Griffith [facility] Gender: Male DOB: April 30, 1941 Age: 80 Y 79 M 30 D Return Phone Number: [phone] (Primary), [phone] (Secondary) Address: [place]  [address] Client [facility] Day - Client Client Site [place] - Day Provider Viviana Simpler- MD Contact Type Call Who Is Calling Patient / Member / Family / Caregiver Call Type Triage / Clinical Relationship To Patient Self Return Phone Number [phone] (Primary) Chief Complaint Flank Pain Reason for Call Symptomatic / Request for Health Information Initial Comment Wednesday he passed out and hit his ribs. Today he has pain in his ribs, hurts to move and breath. Translation No Nurse Assessment Nurse: Claiborne Billings, RN, Kim Date/Time (Eastern Time): 07/15/2021 10:47:00 AM Confirm and document reason for call. If symptomatic, describe symptoms. ---Caller states he passed out Wednesday, hit his ribs on the left side on a table and now it hurts to move and breathe. Current pain level is 8-9/10. Does the patient have any new or worsening symptoms? ---Yes Will a triage be completed? ---Yes Related visit to physician within the last 2 weeks? ---No Does the PT have any chronic conditions? (i.e. diabetes, asthma, this includes High risk factors for pregnancy, etc.) ---Yes List chronic conditions. ---HTN, HLP Is this a behavioral health or substance abuse call? ---No Guidelines Guideline Title Affirmed Question Affirmed Notes Nurse Date/Time Eilene Ghazi Time) Chest Injury SEVERE chest pain Claiborne Billings, RN, Kim 07/15/2021 10:48:56 AM Disp. Time Eilene Ghazi Time) Disposition Final User 07/15/2021 10:50:24 AM Go to ED Now Yes Claiborne Billings, RN, Max Sane Disagree/Comply Disagree PLEASE NOTE: All timestamps contained within  this report are represented as [place] Standard Time. CONFIDENTIALTY NOTICE: This fax transmission is intended only for the addressee. It contains information that is legally privileged, confidential or otherwise protected from use or disclosure. If you are not the intended recipient, you are strictly prohibited from reviewing, disclosing, copying using or disseminating any of this information or taking any action in reliance on or regarding this information. If you have received this fax in error, please notify us immediately by telephone so that we can arrange for its return to [place]. Phone: [phone], Toll-Free: [phone], Fax: [phone] Page: 2 of 2 Call Id: [id number] [place] Understands Yes PreDisposition InappropriateToAsk Care Advice Given Per Guideline GO TO ED NOW: * You need to be seen in the Emergency Department. * Go to the ED at ___________ [place] now. Drive carefully. NOTE TO TRIAGER - DRIVING: * Another adult should drive. * Patient should not delay going to the emergency department. CARE ADVICE given per Chest Injury (Adult) guideline. Referrals GO TO FACILITY REFUSED Warm transfer to backlin

## 2021-07-19 ENCOUNTER — Ambulatory Visit (INDEPENDENT_AMBULATORY_CARE_PROVIDER_SITE_OTHER): Payer: PPO | Admitting: Internal Medicine

## 2021-07-19 ENCOUNTER — Other Ambulatory Visit: Payer: Self-pay

## 2021-07-19 ENCOUNTER — Encounter: Payer: Self-pay | Admitting: Internal Medicine

## 2021-07-19 ENCOUNTER — Telehealth: Payer: Self-pay

## 2021-07-19 DIAGNOSIS — I499 Cardiac arrhythmia, unspecified: Secondary | ICD-10-CM | POA: Diagnosis not present

## 2021-07-19 DIAGNOSIS — I4891 Unspecified atrial fibrillation: Secondary | ICD-10-CM | POA: Diagnosis not present

## 2021-07-19 DIAGNOSIS — R030 Elevated blood-pressure reading, without diagnosis of hypertension: Secondary | ICD-10-CM | POA: Diagnosis not present

## 2021-07-19 MED ORDER — APIXABAN 5 MG PO TABS: 5.0000 mg | ORAL_TABLET | Freq: Two times a day (BID) | ORAL | 11 refills | Status: DC

## 2021-07-19 NOTE — Telephone Encounter (Signed)
[facility] Night - Client Nonclinical Telephone Record  [facility]urse Client [facility] Night - Client Client Site [place] - Night Provider Viviana Simpler- MD Contact Type Call Who Is Calling Patient / Member / Family / Caregiver Caller Name Nelson Phone Number [phone] Patient Name Jonathan Griffith Patient DOB 12/02/1940 Call Type Message Only Information Provided Reason for Call Request to Schedule Office Appointment Initial Comment Caller states he pass out Wednesday and went to the ER but is BP is going up and he wants to be seen before it gets too high. Caller states he wants to schedule an appt for tomorrow. Patient request to speak to RN No Additional Comment Decline triage. Office hours provided. Disp. Time Disposition Final User 07/18/2021 11:03:59 AM General Information Provided Yes Cathlean Sauer Call Closed By: Cathlean Sauer Transaction Date/Time: 07/18/2021 11:00:09 AM (ET

## 2021-07-19 NOTE — Assessment & Plan Note (Signed)
His wrist cuff is measuring way over our readings BP Readings from Last 3 Encounters:  07/19/21 (!) 142/62  06/03/21 134/72  10/15/20 102/68   He got 156/102 when I got the 142/62 Is on losartan Given the recent syncope, I would be reluctant to increase his losartan

## 2021-07-19 NOTE — Telephone Encounter (Signed)
Per chart review tab pt has already had a visit with Dr Silvio Pate today.

## 2021-07-19 NOTE — Progress Notes (Signed)
Subjective:    Patient ID: Jonathan Griffith, male    DOB: [birth year], 81 y.o.   MRN: [id number]  HPI Here due to elevated BP readings at home  Using a wrist cuff--new one Fell 6 days ago  Had dropped off son at airport in [place] trip home Had mixed drink Then after taking medication at bedtime---swallowed some hot cocoa to wash them down [place] and hit left chest on table Only out for a few seconds Went to [facility] he has rib fracture  Pain continues--using OTC analgesic No palpitations No chest pain other than the rib Has some pleurtic pain--but no separate SOB  BP as high as 207/100, 208/111, 214/130 Today 177/110  Current Outpatient Medications on File Prior to Visit  Medication Sig Dispense Refill   ALPHAGAN P 0.1 % SOLN Place 2 drops into the right eye daily.     aspirin EC 81 MG tablet Take 81 mg by mouth daily. Swallow whole.     Cholecalciferol (VITAMIN D-3) 125 MCG (5000 UT) TABS Take 5,000 Units by mouth at bedtime.     ILEVRO 0.3 % ophthalmic suspension Place 1 drop into the right eye at bedtime.     losartan (COZAAR) 50 MG tablet TAKE 1 TABLET BY MOUTH EVERY DAY 90 tablet 3   Multiple Vitamins-Minerals (MULTIVITAMIN WITH MINERALS) tablet Take 1 tablet by mouth at bedtime.     nitroGLYCERIN (NITROSTAT) 0.4 MG SL tablet Place 1 tablet (0.4 mg total) under the tongue every 5 (five) minutes as needed for chest pain (x 3 doses). 25 tablet 3   omeprazole (PRILOSEC) 20 MG capsule TAKE 1 CAPSULE (20 MG TOTAL) BY MOUTH DAILY. ON AN EMPTY STOMACH (Patient taking differently: Take 20 mg by mouth daily as needed. On an empty stomach) 90 capsule 3   prednisoLONE acetate (PRED FORTE) 1 % ophthalmic suspension Place 2 drops into the right eye daily.     simvastatin (ZOCOR) 40 MG tablet TAKE ONE TABLET BY MOUTH EACH EVENING 90 tablet 3   vitamin B-12 (CYANOCOBALAMIN) 500 MCG tablet Take 500 mcg by mouth daily.     No current facility-administered medications on  file prior to visit.    Allergies  Allergen Reactions   Ibuprofen     REACTION: ulcer    Past Medical History:  Diagnosis Date   Allergic rhinitis    Anginal pain (HCC)    CAD (coronary artery disease)    Chest pain    Duodenal ulcer    Dyspnea    On exertions   ED (erectile dysfunction)    GERD (gastroesophageal reflux disease)    History of colonoscopy    Hyperlipidemia    Hypertension    Osteoarthritis     Past Surgical History:  Procedure Laterality Date   APPENDECTOMY  1964   CARDIAC CATHETERIZATION  03/2010   diffuse early disease   CATARACT EXTRACTION Right 04/18/2016   INGUINAL HERNIA REPAIR Bilateral 1970   double hernia repair    LEFT HEART CATH AND CORONARY ANGIOGRAPHY N/A 10/12/2016   Procedure: Left Heart Cath and Coronary Angiography;  Surgeon: Burnell Blanks, MD;  Location: [facility];  Service: Cardiovascular;  Laterality: N/A;   RETINAL DETACHMENT SURGERY Right 2003   TONSILLECTOMY     TOTAL KNEE ARTHROPLASTY Right 09/10/2020   Procedure: RIGHT TOTAL KNEE ARTHROPLASTY;  Surgeon: Mcarthur Rossetti, MD;  Location: [facility];  Service: Orthopedics;  Laterality: Right;    Family History  Problem Relation Age of  Onset   Coronary artery disease Mother        CABG in 81's   Brain cancer Mother         that was metastatic  from lung    Stomach cancer Father 58   Diabetes Sister    Stroke Maternal Grandmother     Social History   Socioeconomic History   Marital status: Married    Spouse name: Not on file   Number of children: 2   Years of education: Not on file   Highest education level: Not on file  Occupational History   Occupation: Administrator, [employer]    Comment: retired  Tobacco Use   Smoking status: Former    Packs/day: 1.00    Years: 10.00    Pack years: 10.00    Types: Cigars, Cigarettes    Quit date: 07/18/2007    Years since quitting: 14.0   Smokeless tobacco: Never   Tobacco comments:    Occ cigar - no cigarettes -  stopped in 6-09  Vaping Use   Vaping Use: Never used  Substance and Sexual Activity   Alcohol use: Yes    Alcohol/week: 1.0 standard drink    Types: 1 Standard drinks or equivalent per week    Comment: 3-4 drinks per week   Drug use: No   Sexual activity: Not on file  Other Topics Concern   Not on file  Social History Narrative   Has living will   Wife would serve as health care POA--then son   Would accept resuscitation attempts but no prolonged artificial ventilation    No tube feeds if cognitively unaware   Social Determinants of Health   Financial Resource Strain: Not on file  Food Insecurity: Not on file  Transportation Needs: Not on file  Physical Activity: Not on file  Stress: Not on file  Social Connections: Not on file  Intimate Partner Violence: Not on file   Review of Systems No dizziness since fainting Appetite is off--but still eating     Objective:   Physical Exam Constitutional:      Appearance: Normal appearance.  Cardiovascular:     Rate and Rhythm: Normal rate.     Heart sounds: No murmur heard.   No gallop.     Comments: Slightly irregular Pulmonary:     Effort: Pulmonary effort is normal.     Breath sounds: Normal breath sounds. No wheezing or rales.  Musculoskeletal:     Cervical back: Neck supple.     Right lower leg: No edema.     Left lower leg: No edema.  Lymphadenopathy:     Cervical: No cervical adenopathy.  Neurological:     Mental Status: He is alert.  Psychiatric:        Mood and Affect: Mood normal.        Behavior: Behavior normal.           Assessment & Plan:

## 2021-07-19 NOTE — Assessment & Plan Note (Signed)
This is new and asymptomatic ---so unclear how long it is going on It is possible that it is related to the syncopal spell he had a few days ago Will start eliquis Set up follow up soon with Dr Angelena Form

## 2021-07-19 NOTE — Assessment & Plan Note (Signed)
Will check EKG

## 2021-07-20 IMAGING — DX DG CHEST 2V
2 series · 2 of 2 positions shown · non-contrast
Comparison: 11/13/2016 chest radiograph.

CLINICAL DATA: Prominent clavicle

EXAM:
CHEST - 2 VIEW

[chest pa]
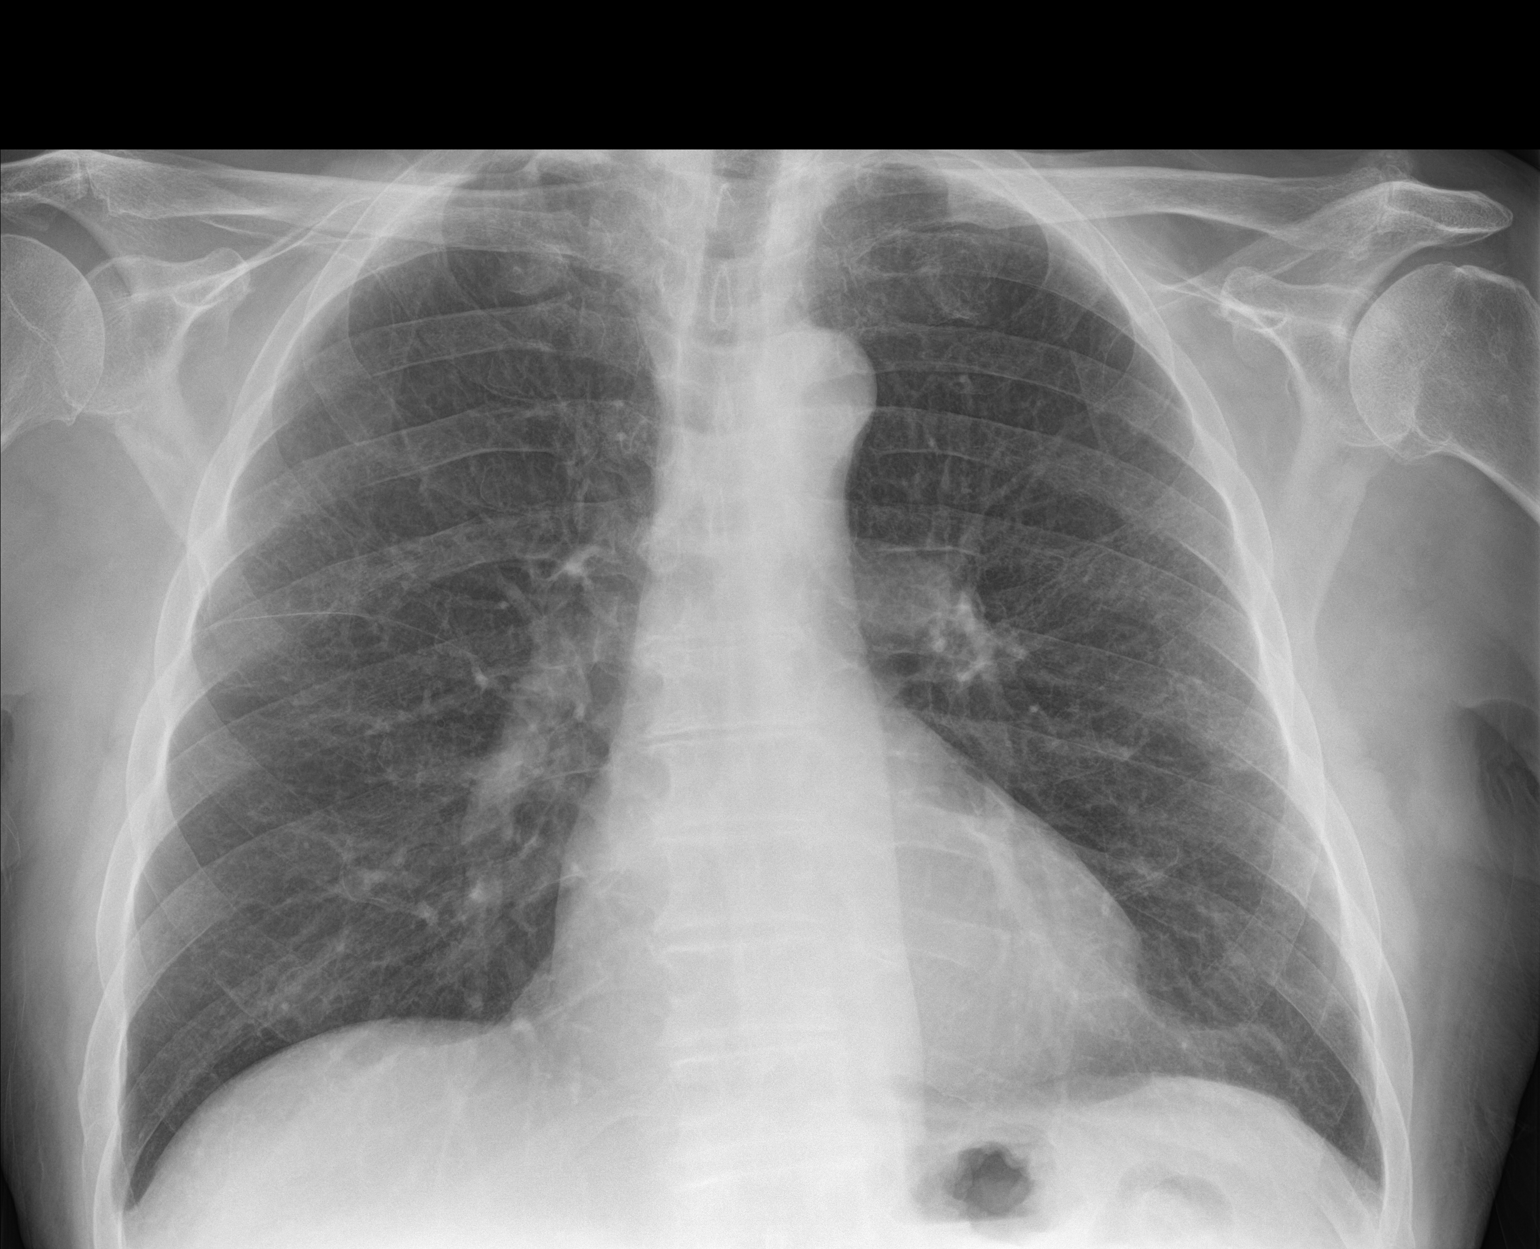

[chest lat]
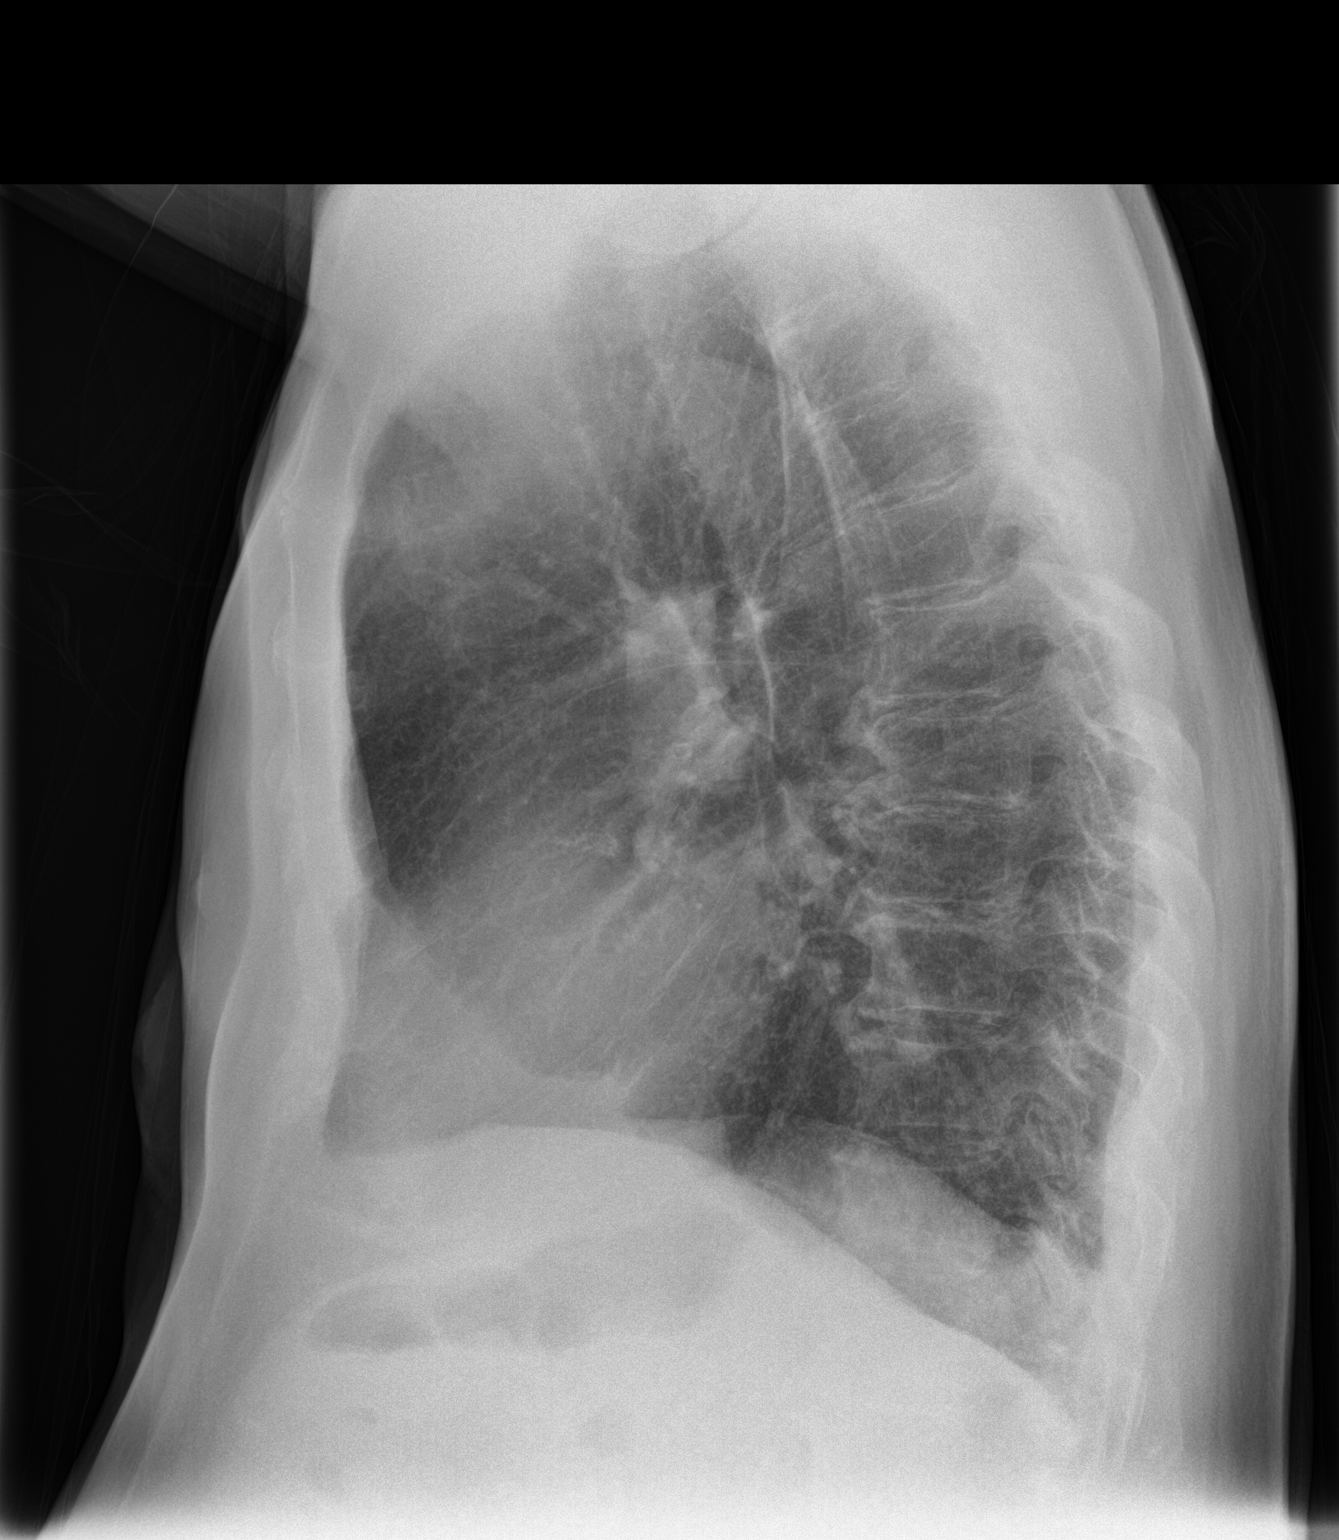

[2 of 2 positions shown; findings below may reference images not displayed]

FINDINGS: Stable cardiomediastinal silhouette with normal heart size. No
pneumothorax. No pleural effusion. Mild biapical pleural-parenchymal
scarring, not appreciably changed. No pulmonary edema. No acute
consolidative airspace disease. No displaced fractures or aggressive
appearing focal osseous lesions in the visualized chest. Bilateral
acromioclavicular joint osteoarthritis.
IMPRESSION: No active cardiopulmonary disease.

## 2021-07-25 ENCOUNTER — Ambulatory Visit: Payer: PPO | Admitting: Cardiovascular Disease

## 2021-07-25 ENCOUNTER — Telehealth: Payer: Self-pay | Admitting: *Deleted

## 2021-07-25 ENCOUNTER — Ambulatory Visit (INDEPENDENT_AMBULATORY_CARE_PROVIDER_SITE_OTHER): Payer: PPO

## 2021-07-25 ENCOUNTER — Other Ambulatory Visit: Payer: Self-pay

## 2021-07-25 ENCOUNTER — Encounter: Payer: Self-pay | Admitting: *Deleted

## 2021-07-25 ENCOUNTER — Encounter (INDEPENDENT_AMBULATORY_CARE_PROVIDER_SITE_OTHER): Payer: PPO | Admitting: Ophthalmology

## 2021-07-25 ENCOUNTER — Encounter: Payer: Self-pay | Admitting: Cardiovascular Disease

## 2021-07-25 VITALS — BP 140/78 | HR 55 | Ht 67.0 in | Wt 192.6 lb

## 2021-07-25 DIAGNOSIS — I10 Essential (primary) hypertension: Secondary | ICD-10-CM | POA: Diagnosis not present

## 2021-07-25 DIAGNOSIS — R55 Syncope and collapse: Secondary | ICD-10-CM

## 2021-07-25 DIAGNOSIS — H59031 Cystoid macular edema following cataract surgery, right eye: Secondary | ICD-10-CM

## 2021-07-25 DIAGNOSIS — I25119 Atherosclerotic heart disease of native coronary artery with unspecified angina pectoris: Secondary | ICD-10-CM | POA: Diagnosis not present

## 2021-07-25 DIAGNOSIS — E7800 Pure hypercholesterolemia, unspecified: Secondary | ICD-10-CM

## 2021-07-25 DIAGNOSIS — H43813 Vitreous degeneration, bilateral: Secondary | ICD-10-CM | POA: Diagnosis not present

## 2021-07-25 DIAGNOSIS — H35373 Puckering of macula, bilateral: Secondary | ICD-10-CM | POA: Diagnosis not present

## 2021-07-25 DIAGNOSIS — H35033 Hypertensive retinopathy, bilateral: Secondary | ICD-10-CM | POA: Diagnosis not present

## 2021-07-25 DIAGNOSIS — I480 Paroxysmal atrial fibrillation: Secondary | ICD-10-CM

## 2021-07-25 NOTE — Patient Instructions (Signed)
Medication Instructions:  No changes *If you need a refill on your cardiac medications before your next appointment, please call your pharmacy*   Lab Work: none  Testing/Procedures: [employer] Cardiac Monitor -30 days   Follow-Up: At [facility], you and your health needs are our priority.  As part of our continuing mission to provide you with exceptional heart care, we have created designated Provider Care Teams.  These Care Teams include your primary Cardiologist (physician) and Advanced Practice Providers (APPs -  Physician Assistants and Nurse Practitioners) who all work together to provide you with the care you need, when you need it.  We recommend signing up for the patient portal called "MyChart".  Sign up information is provided on this After Visit Summary.  MyChart is used to connect with patients for Virtual Visits (Telemedicine).  Patients are able to view lab/test results, encounter notes, upcoming appointments, etc.  Non-urgent messages can be sent to your provider as well.   To learn more about what you can do with MyChart, go to [email or web address].    Your next appointment:   6 week(s)  The format for your next appointment:   In Person  Provider:   APP or Dr. Angelena Form

## 2021-07-25 NOTE — Progress Notes (Signed)
Chief Complaint  Patient presents with   Follow-up    Atrial fib   History of Present Illness: 81 yo male with history of CAD, HTN, hyperlipidemia, GERD and new diagnosis of atrial fibrillation who is here today for cardiac follow up. He is known to have mild to moderate CAD with last cath in March 2018 showing 40% ostial LAD stenosis and mild disease in the RCA and intermediate [place]. LV function was normal. He has not tolerated Imdur in the past. He had bradycardia with Bystolic so it was stopped. He has been seen in the Pulmonary clinic for workup of dyspnea and is not felt to have significant lung disease. He has described mild chest pain with heavy exertion for years. He did not tolerate Imdur and has not been willing to try Ranexa. Echo 06/28/21 with LVEF=60-65%, no significant valve disease. He recently had a fall and was found to have a rib fracture on the left. Seen in primary care by Dr. Silvio Pate 07/19/20 and atrial fib noted. EKG reviewed by me and shows atrial fib with HR of 59 bpm. He was started on Eliquis. He is tolerating this well.   He is here today for follow up. The patient denies any chest pain, dyspnea, palpitations, lower extremity edema, orthopnea, PND, dizziness, near syncope or syncope.   Primary Care Physician: Venia Carbon, MD  Past Medical History:  Diagnosis Date   Allergic rhinitis    Anginal pain (HCC)    CAD (coronary artery disease)    Chest pain    Duodenal ulcer    Dyspnea    On exertions   ED (erectile dysfunction)    GERD (gastroesophageal reflux disease)    History of colonoscopy    Hyperlipidemia    Hypertension    Osteoarthritis     Past Surgical History:  Procedure Laterality Date   APPENDECTOMY  1964   CARDIAC CATHETERIZATION  03/2010   diffuse early disease   CATARACT EXTRACTION Right 04/18/2016   INGUINAL HERNIA REPAIR Bilateral 1970   double hernia repair    LEFT HEART CATH AND CORONARY ANGIOGRAPHY N/A 10/12/2016   Procedure:  Left Heart Cath and Coronary Angiography;  Surgeon: Burnell Blanks, MD;  Location: [facility];  Service: Cardiovascular;  Laterality: N/A;   RETINAL DETACHMENT SURGERY Right 2003   TONSILLECTOMY     TOTAL KNEE ARTHROPLASTY Right 09/10/2020   Procedure: RIGHT TOTAL KNEE ARTHROPLASTY;  Surgeon: Mcarthur Rossetti, MD;  Location: [facility];  Service: Orthopedics;  Laterality: Right;    Current Outpatient Medications  Medication Sig Dispense Refill   ALPHAGAN P 0.1 % SOLN Place 2 drops into the right eye daily.     apixaban (ELIQUIS) 5 MG TABS tablet Take 1 tablet (5 mg total) by mouth 2 (two) times daily. 60 tablet 11   Cholecalciferol (VITAMIN D-3) 125 MCG (5000 UT) TABS Take 5,000 Units by mouth at bedtime.     ILEVRO 0.3 % ophthalmic suspension Place 1 drop into the right eye at bedtime.     losartan (COZAAR) 50 MG tablet TAKE 1 TABLET BY MOUTH EVERY DAY 90 tablet 3   magnesium oxide (MAG-OX) 400 MG tablet Take 400 mg by mouth daily.     Multiple Vitamins-Minerals (MULTIVITAMIN WITH MINERALS) tablet Take 1 tablet by mouth at bedtime.     nitroGLYCERIN (NITROSTAT) 0.4 MG SL tablet Place 1 tablet (0.4 mg total) under the tongue every 5 (five) minutes as needed for chest pain (  x 3 doses). 25 tablet 3   omeprazole (PRILOSEC) 20 MG capsule TAKE 1 CAPSULE (20 MG TOTAL) BY MOUTH DAILY. ON AN EMPTY STOMACH (Patient taking differently: Take 20 mg by mouth daily as needed. On an empty stomach) 90 capsule 3   prednisoLONE acetate (PRED FORTE) 1 % ophthalmic suspension Place 2 drops into the right eye daily.     simvastatin (ZOCOR) 40 MG tablet TAKE ONE TABLET BY MOUTH EACH EVENING 90 tablet 3   vitamin B-12 (CYANOCOBALAMIN) 500 MCG tablet Take 500 mcg by mouth daily.     No current facility-administered medications for this visit.    Allergies  Allergen Reactions   Ibuprofen     REACTION: ulcer    Social History   Socioeconomic History   Marital status: Married    Spouse  name: Not on file   Number of children: 2   Years of education: Not on file   Highest education level: Not on file  Occupational History   Occupation: Administrator, [employer]    Comment: retired  Tobacco Use   Smoking status: Former    Packs/day: 1.00    Years: 10.00    Pack years: 10.00    Types: Cigars, Cigarettes    Quit date: 07/18/2007    Years since quitting: 14.0   Smokeless tobacco: Never   Tobacco comments:    Occ cigar - no cigarettes - stopped in 6-09  Vaping Use   Vaping Use: Never used  Substance and Sexual Activity   Alcohol use: Yes    Alcohol/week: 1.0 standard drink    Types: 1 Standard drinks or equivalent per week    Comment: 3-4 drinks per week   Drug use: No   Sexual activity: Not on file  Other Topics Concern   Not on file  Social History Narrative   Has living will   Wife would serve as health care POA--then son   Would accept resuscitation attempts but no prolonged artificial ventilation    No tube feeds if cognitively unaware   Social Determinants of Health   Financial Resource Strain: Not on file  Food Insecurity: Not on file  Transportation Needs: Not on file  Physical Activity: Not on file  Stress: Not on file  Social Connections: Not on file  Intimate Partner Violence: Not on file    Family History  Problem Relation Age of Onset   Coronary artery disease Mother        CABG in 59's   Brain cancer Mother         that was metastatic  from lung    Stomach cancer Father 19   Diabetes Sister    Stroke Maternal Grandmother     Review of Systems:  As stated in the HPI and otherwise negative.   BP 140/78    Pulse (!) 55    Ht 5\' 7"  (1.702 m)    Wt 192 lb 9.6 oz (87.4 kg)    SpO2 98%    BMI 30.17 kg/m   Physical Examination:  General: Well developed, well nourished, NAD  HEENT: OP clear, mucus membranes moist  SKIN: warm, dry. No rashes. Neuro: No focal deficits  Musculoskeletal: Muscle strength 5/5 all ext  Psychiatric: Mood and affect  normal  Neck: No JVD, no carotid bruits, no thyromegaly, no lymphadenopathy.  Lungs:Clear bilaterally, no wheezes, rhonci, crackles Cardiovascular: Regular rate and rhythm. No murmurs, gallops or rubs. Abdomen:Soft. Bowel sounds present. Non-tender.  Extremities: No lower extremity edema. Pulses are  2 + in the bilateral DP/PT.  EKG:  EKG is ordered today. The ekg ordered today demonstrates Atrial fibrillation, rate 55 bpm  Echo 06/28/21:  1. Left ventricular ejection fraction, by estimation, is 60 to 65%. The  left ventricle has normal function. The left ventricle has no regional  wall motion abnormalities. There is mild concentric left ventricular  hypertrophy. Left ventricular diastolic  parameters are indeterminate.   2. Right ventricular systolic function is normal. The right ventricular  size is normal. Tricuspid regurgitation signal is inadequate for assessing  PA pressure.   3. Left atrial size was mildly dilated.   4. The mitral valve is normal in structure. Trivial mitral valve  regurgitation.   5. The aortic valve is tricuspid. There is mild calcification of the  aortic valve. There is mild thickening of the aortic valve. Aortic valve  regurgitation is trivial. Aortic valve sclerosis/calcification is present,  without any evidence of aortic  stenosis.   6. Aortic dilatation noted. There is mild dilatation of the aortic root,  measuring 39 mm.   7. The inferior vena cava is normal in size with greater than 50%  respiratory variability, suggesting right atrial pressure of 3 mmHg.   Recent Labs: 09/11/2020: BUN 19; Creatinine, Ser 0.94; Hemoglobin 12.4; Platelets 218; Potassium 4.3; Sodium 139   Lipid Panel    Component Value Date/Time   CHOL 144 10/10/2019 0804   TRIG 105.0 10/10/2019 0804   HDL 44.70 10/10/2019 0804   CHOLHDL 3 10/10/2019 0804   VLDL 21.0 10/10/2019 0804   LDLCALC 79 10/10/2019 0804   LDLDIRECT 131.3 04/06/2009 0922     Wt Readings from Last 3  Encounters:  07/25/21 192 lb 9.6 oz (87.4 kg)  07/19/21 189 lb (85.7 kg)  06/03/21 187 lb (84.8 kg)     Other studies Reviewed: Additional studies/ records that were reviewed today include: . Review of the above records demonstrates:    Assessment and Plan:   1. CAD with stable angina:  He has chronic chest pain. No recent change in his baseline chest pains. His angina is felt to be due to microvascular disease. Cardiac cath in March 2018 with stable mild CAD. No beta blocker due to bradycardia in past while on beta blockers.  He never tried Ranexa. He did not tolerate Imdur. Continue ASA and statin.   2. HTN: BP is controlled. No changes today  3. Hyperlipidemia: Lipids followed in primary care. Continue statin  4. Atrial fibrillation, new onset: He is in atrial fibrillation today. Rate 55 bpm. CHADS VASC score of 4 (age, HTN, CAD). Will continue Eliquis 5 mg po BID. Rate is ok today. I will not start any rate control agents given bradycardia.   5. Syncope: Unclear etiology. This could be related to bradycardia. Will place 30 day live monitor.   Current medicines are reviewed at length with the patient today.  The patient does not have concerns regarding medicines.  The following changes have been made:  no change  Labs/ tests ordered today include:   Orders Placed This Encounter  Procedures   Cardiac event monitor   EKG 12-Lead     Disposition:   F/U with me in 12 months.    Signed, Lauree Chandler, MD 07/25/2021 3:05 PM    [facility] [address], [place], [place]  [address] Phone: [phone]; Fax: [phone]

## 2021-07-25 NOTE — Telephone Encounter (Signed)
Jonathan Griffith from [facility] called to report baseline rhythm and the first documentation for them of afib sustained HR 60.  His monitor was applied after his visit at the office this afternoon.  Dr. Angelena Form is aware.

## 2021-07-25 NOTE — Progress Notes (Unsigned)
[id number] [employer] 30 day cardiac event monitor from office inventory applied to patient.  Monitor changed to [id number] on 08/02/21. [employer] notified.  See encounter note.

## 2021-07-26 ENCOUNTER — Other Ambulatory Visit: Payer: Self-pay | Admitting: Cardiovascular Disease

## 2021-08-01 ENCOUNTER — Telehealth: Payer: Self-pay | Admitting: Cardiovascular Disease

## 2021-08-01 NOTE — Telephone Encounter (Signed)
Patient calling in regards to the heart monitor that was put on. He is having difficulties with it. Please advise

## 2021-08-01 NOTE — Telephone Encounter (Signed)
Patient scheduled to come in Tuesday 08/02/21 at 8:30 AM to have strip and monitor battery changed.  Give patient additional strips.

## 2021-08-02 ENCOUNTER — Encounter: Payer: Self-pay | Admitting: *Deleted

## 2021-08-02 ENCOUNTER — Other Ambulatory Visit: Payer: Self-pay

## 2021-08-02 DIAGNOSIS — I480 Paroxysmal atrial fibrillation: Secondary | ICD-10-CM | POA: Diagnosis not present

## 2021-08-02 DIAGNOSIS — I25119 Atherosclerotic heart disease of native coronary artery with unspecified angina pectoris: Secondary | ICD-10-CM | POA: Diagnosis not present

## 2021-08-02 DIAGNOSIS — R55 Syncope and collapse: Secondary | ICD-10-CM | POA: Diagnosis not present

## 2021-08-02 NOTE — Progress Notes (Unsigned)
Patient ID: Jonathan Griffith, male   DOB: 07/22/1940, 81 y.o.   MRN: [id number] Patient had [employer] cardiac event monitor serial # [id number] applied in office on 07/25/21.  He has had continuous problems and has tried troubleshooting with [employer].  Patient came into office today.  It appears Monitor #1 was not working at all, flashing X and would not shut off and start charging when placed on the charger.  Monitor #2 was applied and excepted , even though it appeared to have 60 cycle interference.  Monitor removed and will be sent back to [employer] for repair. Applied replacement monitor serial [id number] from our office inventory and requested [employer] change serial # on patients enrollment and replenish our inventory via Email.   [facility] representative contacted.

## 2021-08-02 NOTE — Progress Notes (Unsigned)
Patient ID: Jonathan Griffith, male   DOB: [birth year], 81 y.o.   MRN: [id number] Patient had [employer] cardiac event monitor serial # [id number] applied in office on 07/25/21.  He has had continuous problems and has tried troubleshooting with [employer].  Patient came into office today.  It appears Monitor #1 was not working at all, flashing X and would not shut off and start charging when placed on the charger.  Monitor #2 was applied and excepted , even though it appeared to have 60 cycle interference.  Monitor removed and will be sent back to [employer] for repair. Applied replacement monitor serial [id number] from our office inventory and requested [employer] change serial # on patients enrollment and replenish our inventory via Email.   [facility] representative contacted.

## 2021-08-04 ENCOUNTER — Telehealth: Payer: Self-pay

## 2021-08-04 ENCOUNTER — Encounter: Payer: Self-pay | Admitting: *Deleted

## 2021-08-04 NOTE — Progress Notes (Signed)
Patient ID: Jonathan Griffith, male   DOB: 09-27-1940, 81 y.o.   MRN: [id number] Received call from Evelena Asa, [employer] representative. [employer] had attempted calling with a critical notification on patient and were unable to get through. Critical event 08/04/21 Atrial Fibrillation with 4.3 second pause.  Patient was symptomatic, felt swimmy headed.  Event printed out and brought to [facility].

## 2021-08-04 NOTE — Telephone Encounter (Signed)
°  ° °  Cardiac Monitor Alert  Date of alert:  08/04/2021   Patient Name: Jonathan Griffith  DOB: [birth year]  MRN: [id number]   [facility] Cardiologist: Lauree Chandler, MD  [facility] EP:  None    Monitor Information: Cardiac Event Monitor [[employer]]  Reason:  Syncope and Collapse; paroxysmal atrial fibrillation Ordering provider:  Angelena Form, C { Alert Atrial Fibrillation/Flutter This is the 2nd alert for this rhythm.  The patient has a hx of Atrial Fibrillation/Flutter.   Pause(s) - Longest:  4.3 seconds This is the 1st alert for this rhythm.   Confirm that the anticoagulant listed below is correct. If not update the chart   :1}  Anticoagulation medication as of 08/04/2021           apixaban (ELIQUIS) 5 MG TABS tablet Take 1 tablet (5 mg total) by mouth 2 (two) times daily.       Next Cardiology Appointment   Date:  09/07/21  @ 10:40 Provider:  Angelena Form  The patient was contacted today.  He is symptomatic.  He reports the following symptoms:  "dizzy headed." Arrhythmia, symptoms and history reviewed with (DOD) [name].   Plan:  Pt to be seen in office tomorrow @0845  with Dr Lovena Le to discuss pacemaker implant. Spoke with patient who agrees to come in to office tomorrow to be evaluated by one of our EP physicians.   Other: Pt states that he was at a friend's body shop this morning, sitting and talking. Condones that he picked up his [employer] drink and took a "big swallow" and instantly got swimmy headed. Pt denies physical exertion prior to event. Denies nausea or vomiting. No syncope. No missed doses of Eliquis. ED precautions given.  Ma Hillock, RN  08/04/2021 12:26 PM

## 2021-08-05 ENCOUNTER — Other Ambulatory Visit: Payer: Self-pay

## 2021-08-05 ENCOUNTER — Telehealth: Payer: Self-pay | Admitting: Cardiovascular Disease

## 2021-08-05 ENCOUNTER — Ambulatory Visit: Payer: PPO | Admitting: Internal Medicine

## 2021-08-05 ENCOUNTER — Encounter: Payer: Self-pay | Admitting: Internal Medicine

## 2021-08-05 VITALS — BP 144/88 | HR 83 | Ht 66.0 in | Wt 185.8 lb

## 2021-08-05 DIAGNOSIS — R001 Bradycardia, unspecified: Secondary | ICD-10-CM | POA: Diagnosis not present

## 2021-08-05 DIAGNOSIS — R55 Syncope and collapse: Secondary | ICD-10-CM | POA: Diagnosis not present

## 2021-08-05 LAB — BASIC METABOLIC PANEL
BUN/Creatinine Ratio: 17 (ref 10–24)
BUN: 19 mg/dL (ref 8–27)
CO2: 27 mmol/L (ref 20–29)
Calcium: 10 mg/dL (ref 8.6–10.2)
Chloride: 100 mmol/L (ref 96–106)
Creatinine, Ser: 1.12 mg/dL (ref 0.76–1.27)
Glucose: 92 mg/dL (ref 70–99)
Potassium: 4.7 mmol/L (ref 3.5–5.2)
Sodium: 141 mmol/L (ref 134–144)
eGFR: 66 mL/min/{1.73_m2} (ref >59)

## 2021-08-05 LAB — CBC WITH DIFFERENTIAL/PLATELET
Basophils Absolute: 0.1 10*3/uL (ref 0.0–0.2)
Basos: 1 %
EOS (ABSOLUTE): 0.1 10*3/uL (ref 0.0–0.4)
Eos: 1 %
Hematocrit: 47.1 % (ref 37.5–51.0)
Hemoglobin: 16.1 g/dL (ref 13.0–17.7)
Immature Grans (Abs): 0 10*3/uL (ref 0.0–0.1)
Immature Granulocytes: 0 %
Lymphocytes Absolute: 1.9 10*3/uL (ref 0.7–3.1)
Lymphs: 27 %
MCH: 29.7 pg (ref 26.6–33.0)
MCHC: 34.2 g/dL (ref 31.5–35.7)
MCV: 87 fL (ref 79–97)
Monocytes Absolute: 0.7 10*3/uL (ref 0.1–0.9)
Monocytes: 11 %
Neutrophils Absolute: 4.2 10*3/uL (ref 1.4–7.0)
Neutrophils: 60 %
Platelets: 275 10*3/uL (ref 150–450)
RBC: 5.43 x10E6/uL (ref 4.14–5.80)
RDW: 12.3 % (ref 11.6–15.4)
WBC: 7 10*3/uL (ref 3.4–10.8)

## 2021-08-05 NOTE — Telephone Encounter (Signed)
° °  [employer] calling to give critical ekg result

## 2021-08-05 NOTE — Pre-Procedure Instructions (Signed)
Attempted to call patient regarding procedure instructions for Monday.  No answer 

## 2021-08-05 NOTE — Telephone Encounter (Signed)
° °  Cardiac Monitor Alert  Date of alert:  08/05/2021   Patient Name: Jonathan Griffith  DOB: May 05, 1941  MRN: [id number]   [facility] Cardiologist: Lauree Chandler, MD  [facility] EP:  Crissie Sickles   Monitor Information: Cardiac Event Monitor [[employer]]  Reason:  Syncope and Collapse: PAF  Ordering provider:  Angelena Form, MD   Alert Atrial Fibrillation/Flutter: HR 40-50 bpm Pause: 3.4 sec at 3:06 pm EST   This is the 2nd alert for this rhythm.  The patient has a hx of Atrial Fibrillation/Flutter.    Anticoagulation medication as of 08/05/2021           apixaban (ELIQUIS) 5 MG TABS tablet Take 1 tablet (5 mg total) by mouth 2 (two) times daily.       Next Cardiology Appointment   Date:  08/05/21  Provider:  Dr. Crissie Sickles  The patient was contacted today.  He is asymptomatic.  Reports was sitting at a funeral did not feel anything.  Pt reports scheduled for PPM insertion on 08/08/21 at 10 am with Dr. Lovena Le.     Other: Monitor report has not been faxed over at this time.  MR reports has not received report.   Precious Gilding, RN  08/05/2021 4:32 PM

## 2021-08-05 NOTE — Patient Instructions (Addendum)
Medication Instructions:  Your physician recommends that you continue on your current medications as directed. Please refer to the Current Medication list given to you today.  Labwork: You will get lab work today;  CBC and BMP  Testing/Procedures: None ordered.  Follow-Up:  SEE INSTRUCTION LETTER  Any Other Special Instructions Will Be Listed Below (If Applicable).  If you need a refill on your cardiac medications before your next appointment, please call your pharmacy.   Pacemaker Implantation, Adult Pacemaker implantation is a procedure to place a pacemaker inside the chest. A pacemaker is a small computer that sends electrical signals to the heart and helps the heart beat normally. A pacemaker also stores information about heart rhythms. You may need pacemaker implantation if you have: A slow heartbeat (bradycardia). Loss of consciousness that happens repeatedly (syncope) or repeated episodes of dizziness or light-headedness because of an irregular heart rate. Shortness of breath (dyspnea) due to heart problems. The pacemaker usually attaches to your heart through a wire called a lead. One or two leads may be needed. There are different types of pacemakers: Transvenous pacemaker. This type is placed under the skin or muscle of your upper chest area. The lead goes through a vein in the chest area to reach the inside of the heart. Epicardial pacemaker. This type is placed under the skin or muscle of your chest or abdomen. The lead goes through your chest to the outside of the heart. Tell a health care provider about: Any allergies you have. All medicines you are taking, including vitamins, herbs, eye drops, creams, and over-the-counter medicines. Any problems you or family members have had with anesthetic medicines. Any blood or bone disorders you have. Any surgeries you have had. Any medical conditions you have. Whether you are pregnant or may be pregnant. What are the  risks? Generally, this is a safe procedure. However, problems may occur, including: Infection. Bleeding. Failure of the pacemaker or the lead. Collapse of a lung or bleeding into a lung. Blood clot inside a blood vessel with a lead. Damage to the heart. Infection inside the heart (endocarditis). Allergic reactions to medicines. What happens before the procedure? Staying hydrated Follow instructions from your health care provider about hydration, which may include: Up to 2 hours before the procedure - you may continue to drink clear liquids, such as water, clear fruit juice, black coffee, and plain tea.  Eating and drinking restrictions Follow instructions from your health care provider about eating and drinking, which may include: 8 hours before the procedure - stop eating heavy meals or foods, such as meat, fried foods, or fatty foods. 6 hours before the procedure - stop eating light meals or foods, such as toast or cereal. 6 hours before the procedure - stop drinking milk or drinks that contain milk. 2 hours before the procedure - stop drinking clear liquids. Medicines Ask your health care provider about: Changing or stopping your regular medicines. This is especially important if you are taking diabetes medicines or blood thinners. Taking medicines such as aspirin and ibuprofen. These medicines can thin your blood. Do not take these medicines unless your health care provider tells you to take them. Taking over-the-counter medicines, vitamins, herbs, and supplements. Tests You may have: A heart evaluation. This may include: An electrocardiogram (ECG). This involves placing patches on your skin to check your heart rhythm. A chest X-ray. An echocardiogram. This is a test that uses sound waves (ultrasound) to produce an image of the heart. A cardiac rhythm monitor.  This is used to record your heart rhythm and any events for a longer period of time. Blood tests. Genetic  testing. General instructions Do not use any products that contain nicotine or tobacco for at least 4 weeks before the procedure. These products include cigarettes, e-cigarettes, and chewing tobacco. If you need help quitting, ask your health care provider. Ask your health care provider: How your surgery site will be marked. What steps will be taken to help prevent infection. These steps may include: Removing hair at the surgery site. Washing skin with a germ-killing soap. Receiving antibiotic medicine. Plan to have someone take you home from the hospital or clinic. If you will be going home right after the procedure, plan to have someone with you for 24 hours. What happens during the procedure? An IV will be inserted into one of your veins. You will be given one or more of the following: A medicine to help you relax (sedative). A medicine to numb the area (local anesthetic). A medicine to make you fall asleep (general anesthetic). The next steps vary depending on the type of pacemaker you will be getting. If you are getting a transvenous pacemaker: An incision will be made in your upper chest. A pocket will be made for the pacemaker. It may be placed under the skin or between layers of muscle. The lead will be inserted into a blood vessel that goes to the heart. While X-rays are taken by an imaging machine (fluoroscopy), the lead will be advanced through the vein to the inside of your heart. The other end of the lead will be tunneled under the skin and attached to the pacemaker. If you are getting an epicardial pacemaker: An incision will be made near your ribs or breastbone (sternum) for the lead. The lead will be attached to the outside of your heart. Another incision will be made in your chest or upper abdomen to create a pocket for the pacemaker. The free end of the lead will be tunneled under the skin and attached to the pacemaker. The transvenous or epicardial pacemaker will be  tested. Imaging studies may be done to check the lead position. The incisions will be closed with stitches (sutures), adhesive strips, or skin glue. Bandages (dressings) will be placed over the incisions. The procedure may vary among health care providers and hospitals. What happens after the procedure? Your blood pressure, heart rate, breathing rate, and blood oxygen level will be monitored until you leave the hospital or clinic. You may be given antibiotics. You will be given pain medicine. An ECG and chest X-rays will be done. You may need to wear a continuous type of ECG (Holter monitor) to check your heart rhythm. Your health care provider will program the pacemaker. If you were given a sedative during the procedure, it can affect you for several hours. Do not drive or operate machinery until your health care provider says that it is safe. You will be given a pacemaker identification card. This card lists the implant date, device model, and manufacturer of your pacemaker. Summary A pacemaker is a small computer that sends electrical signals to the heart and helps the heart beat normally. There are different types of pacemakers. A pacemaker may be placed under the skin or muscle of your chest or abdomen. Follow instructions from your health care provider about eating and drinking and about taking medicines before the procedure. This information is not intended to replace advice given to you by your health care provider. Make  sure you discuss any questions you have with your health care provider. Document Revised: 03/15/2021 Document Reviewed: 06/04/2019 Elsevier Patient Education  2022 [employer].

## 2021-08-05 NOTE — H&P (View-Only) (Signed)
HPI [name] is referred today by Dr. Angelena Form for evaluation of syncope. He is a pleasant 81 yo man with a h/o atrial fib diagnosed a few weeks ago. He developed a syncopal episode and was started on eliquis. He is not on any AV nodal blocking drugs. His last ECG in [facility] was in November. At that time he had marked sinus brady and marked first degree AV block with an IVCD. He had a cardiac monitor placed and yesterday had a near syncopal episode associated with a pause of 4 seconds. He is not on any AV or sinus nodal blocking drugs.  Allergies  Allergen Reactions   Ibuprofen     REACTION: ulcer     Current Outpatient Medications  Medication Sig Dispense Refill   ALPHAGAN P 0.1 % SOLN Place 2 drops into the right eye daily.     apixaban (ELIQUIS) 5 MG TABS tablet Take 1 tablet (5 mg total) by mouth 2 (two) times daily. 60 tablet 11   Cholecalciferol (VITAMIN D-3) 125 MCG (5000 UT) TABS Take 5,000 Units by mouth at bedtime.     ILEVRO 0.3 % ophthalmic suspension Place 1 drop into the right eye at bedtime.     losartan (COZAAR) 50 MG tablet TAKE 1 TABLET BY MOUTH EVERY DAY 90 tablet 3   magnesium oxide (MAG-OX) 400 MG tablet Take 400 mg by mouth daily.     Multiple Vitamins-Minerals (MULTIVITAMIN WITH MINERALS) tablet Take 1 tablet by mouth at bedtime.     nitroGLYCERIN (NITROSTAT) 0.4 MG SL tablet Place 1 tablet (0.4 mg total) under the tongue every 5 (five) minutes as needed for chest pain (x 3 doses). 25 tablet 3   omeprazole (PRILOSEC) 20 MG capsule TAKE 1 CAPSULE (20 MG TOTAL) BY MOUTH DAILY. ON AN EMPTY STOMACH (Patient taking differently: Take 20 mg by mouth daily as needed. On an empty stomach) 90 capsule 3   prednisoLONE acetate (PRED FORTE) 1 % ophthalmic suspension Place 2 drops into the right eye daily.     simvastatin (ZOCOR) 40 MG tablet TAKE ONE TABLET BY MOUTH EACH EVENING 90 tablet 3   vitamin B-12 (CYANOCOBALAMIN) 500 MCG tablet Take 500 mcg by mouth daily.     No  current facility-administered medications for this visit.     Past Medical History:  Diagnosis Date   Allergic rhinitis    Anginal pain (HCC)    CAD (coronary artery disease)    Chest pain    Duodenal ulcer    Dyspnea    On exertions   ED (erectile dysfunction)    GERD (gastroesophageal reflux disease)    History of colonoscopy    Hyperlipidemia    Hypertension    Osteoarthritis     ROS:   All systems reviewed and negative except as noted in the HPI.   Past Surgical History:  Procedure Laterality Date   APPENDECTOMY  1964   CARDIAC CATHETERIZATION  03/2010   diffuse early disease   CATARACT EXTRACTION Right 04/18/2016   INGUINAL HERNIA REPAIR Bilateral 1970   double hernia repair    LEFT HEART CATH AND CORONARY ANGIOGRAPHY N/A 10/12/2016   Procedure: Left Heart Cath and Coronary Angiography;  Surgeon: Burnell Blanks, MD;  Location: [facility];  Service: Cardiovascular;  Laterality: N/A;   RETINAL DETACHMENT SURGERY Right 2003   TONSILLECTOMY     TOTAL KNEE ARTHROPLASTY Right 09/10/2020   Procedure: RIGHT TOTAL KNEE ARTHROPLASTY;  Surgeon: Jean Rosenthal  Y, MD;  Location: [facility];  Service: Orthopedics;  Laterality: Right;     Family History  Problem Relation Age of Onset   Coronary artery disease Mother        CABG in 24's   Brain cancer Mother         that was metastatic  from lung    Stomach cancer Father 71   Diabetes Sister    Stroke Maternal Grandmother      Social History   Socioeconomic History   Marital status: Married    Spouse name: Not on file   Number of children: 2   Years of education: Not on file   Highest education level: Not on file  Occupational History   Occupation: Administrator, [employer]    Comment: retired  Tobacco Use   Smoking status: Former    Packs/day: 1.00    Years: 10.00    Pack years: 10.00    Types: Cigars, Cigarettes    Quit date: 07/18/2007    Years since quitting: 14.0   Smokeless tobacco: Never    Tobacco comments:    Occ cigar - no cigarettes - stopped in 6-09  Vaping Use   Vaping Use: Never used  Substance and Sexual Activity   Alcohol use: Yes    Alcohol/week: 1.0 standard drink    Types: 1 Standard drinks or equivalent per week    Comment: 3-4 drinks per week   Drug use: No   Sexual activity: Not on file  Other Topics Concern   Not on file  Social History Narrative   Has living will   Wife would serve as health care POA--then son   Would accept resuscitation attempts but no prolonged artificial ventilation    No tube feeds if cognitively unaware   Social Determinants of Health   Financial Resource Strain: Not on file  Food Insecurity: Not on file  Transportation Needs: Not on file  Physical Activity: Not on file  Stress: Not on file  Social Connections: Not on file  Intimate Partner Violence: Not on file     BP (!) 144/88    Pulse 83    Ht 5\' 6"  (1.676 m)    Wt 185 lb 12.8 oz (84.3 kg)    SpO2 95%    BMI 29.99 kg/m   Physical Exam:  Well appearing NAD HEENT: Unremarkable Neck:  No JVD, no thyromegally Lymphatics:  No adenopathy Back:  No CVA tenderness Lungs:  Clear with no wheezes HEART:  IRegular rate rhythm, no murmurs, no rubs, no clicks Abd:  soft, positive bowel sounds, no organomegally, no rebound, no guarding Ext:  2 plus pulses, no edema, no cyanosis, no clubbing Skin:  No rashes no nodules Neuro:  CN II through XII intact, motor grossly intact  EKG - atrial fib with a controlled VR  Zio monitor - atrial fib with a  4 second pause  Assess/Plan:  Syncope - he is having Stokes Adams attacks and pauses and is on no AV nodal blocking drugs and will undergo PPM insertion. I have discussed the indications/risks/benefits/goals/expectations of the procedure and he wishes to proceed. Because his atrial fib is fairly recent, I will plan to place a DDD device. Atrial fib - his rate is too well controlled on no drugs.  Coags - he will hold his eliquis  after today. HTN - his bp is fairly well controlled. We will consider a beta blocker after the PPM.  Carleene Overlie ,MD

## 2021-08-05 NOTE — Progress Notes (Signed)
HPI Jonathan Griffith is referred today by Dr. Angelena Form for evaluation of syncope. He is a pleasant 81 yo man with a h/o atrial fib diagnosed a few weeks ago. He developed a syncopal episode and was started on eliquis. He is not on any AV nodal blocking drugs. His last ECG in [facility] was in November. At that time he had marked sinus brady and marked first degree AV block with an IVCD. He had a cardiac monitor placed and yesterday had a near syncopal episode associated with a pause of 4 seconds. He is not on any AV or sinus nodal blocking drugs.  Allergies  Allergen Reactions   Ibuprofen     REACTION: ulcer     Current Outpatient Medications  Medication Sig Dispense Refill   ALPHAGAN P 0.1 % SOLN Place 2 drops into the right eye daily.     apixaban (ELIQUIS) 5 MG TABS tablet Take 1 tablet (5 mg total) by mouth 2 (two) times daily. 60 tablet 11   Cholecalciferol (VITAMIN D-3) 125 MCG (5000 UT) TABS Take 5,000 Units by mouth at bedtime.     ILEVRO 0.3 % ophthalmic suspension Place 1 drop into the right eye at bedtime.     losartan (COZAAR) 50 MG tablet TAKE 1 TABLET BY MOUTH EVERY DAY 90 tablet 3   magnesium oxide (MAG-OX) 400 MG tablet Take 400 mg by mouth daily.     Multiple Vitamins-Minerals (MULTIVITAMIN WITH MINERALS) tablet Take 1 tablet by mouth at bedtime.     nitroGLYCERIN (NITROSTAT) 0.4 MG SL tablet Place 1 tablet (0.4 mg total) under the tongue every 5 (five) minutes as needed for chest pain (x 3 doses). 25 tablet 3   omeprazole (PRILOSEC) 20 MG capsule TAKE 1 CAPSULE (20 MG TOTAL) BY MOUTH DAILY. ON AN EMPTY STOMACH (Patient taking differently: Take 20 mg by mouth daily as needed. On an empty stomach) 90 capsule 3   prednisoLONE acetate (PRED FORTE) 1 % ophthalmic suspension Place 2 drops into the right eye daily.     simvastatin (ZOCOR) 40 MG tablet TAKE ONE TABLET BY MOUTH EACH EVENING 90 tablet 3   vitamin B-12 (CYANOCOBALAMIN) 500 MCG tablet Take 500 mcg by mouth daily.     No  current facility-administered medications for this visit.     Past Medical History:  Diagnosis Date   Allergic rhinitis    Anginal pain (HCC)    CAD (coronary artery disease)    Chest pain    Duodenal ulcer    Dyspnea    On exertions   ED (erectile dysfunction)    GERD (gastroesophageal reflux disease)    History of colonoscopy    Hyperlipidemia    Hypertension    Osteoarthritis     ROS:   All systems reviewed and negative except as noted in the HPI.   Past Surgical History:  Procedure Laterality Date   APPENDECTOMY  1964   CARDIAC CATHETERIZATION  03/2010   diffuse early disease   CATARACT EXTRACTION Right 04/18/2016   INGUINAL HERNIA REPAIR Bilateral 1970   double hernia repair    LEFT HEART CATH AND CORONARY ANGIOGRAPHY N/A 10/12/2016   Procedure: Left Heart Cath and Coronary Angiography;  Surgeon: Burnell Blanks, MD;  Location: [facility];  Service: Cardiovascular;  Laterality: N/A;   RETINAL DETACHMENT SURGERY Right 2003   TONSILLECTOMY     TOTAL KNEE ARTHROPLASTY Right 09/10/2020   Procedure: RIGHT TOTAL KNEE ARTHROPLASTY;  Surgeon: Jean Rosenthal  Y, MD;  Location: [facility];  Service: Orthopedics;  Laterality: Right;     Family History  Problem Relation Age of Onset   Coronary artery disease Mother        CABG in 30's   Brain cancer Mother         that was metastatic  from lung    Stomach cancer Father 64   Diabetes Sister    Stroke Maternal Grandmother      Social History   Socioeconomic History   Marital status: Married    Spouse name: Not on file   Number of children: 2   Years of education: Not on file   Highest education level: Not on file  Occupational History   Occupation: Administrator, [employer]    Comment: retired  Tobacco Use   Smoking status: Former    Packs/day: 1.00    Years: 10.00    Pack years: 10.00    Types: Cigars, Cigarettes    Quit date: 07/18/2007    Years since quitting: 14.0   Smokeless tobacco: Never    Tobacco comments:    Occ cigar - no cigarettes - stopped in 6-09  Vaping Use   Vaping Use: Never used  Substance and Sexual Activity   Alcohol use: Yes    Alcohol/week: 1.0 standard drink    Types: 1 Standard drinks or equivalent per week    Comment: 3-4 drinks per week   Drug use: No   Sexual activity: Not on file  Other Topics Concern   Not on file  Social History Narrative   Has living will   Wife would serve as health care POA--then son   Would accept resuscitation attempts but no prolonged artificial ventilation    No tube feeds if cognitively unaware   Social Determinants of Health   Financial Resource Strain: Not on file  Food Insecurity: Not on file  Transportation Needs: Not on file  Physical Activity: Not on file  Stress: Not on file  Social Connections: Not on file  Intimate Partner Violence: Not on file     BP (!) 144/88    Pulse 83    Ht 5\' 6"  (1.676 m)    Wt 185 lb 12.8 oz (84.3 kg)    SpO2 95%    BMI 29.99 kg/m   Physical Exam:  Well appearing NAD HEENT: Unremarkable Neck:  No JVD, no thyromegally Lymphatics:  No adenopathy Back:  No CVA tenderness Lungs:  Clear with no wheezes HEART:  IRegular rate rhythm, no murmurs, no rubs, no clicks Abd:  soft, positive bowel sounds, no organomegally, no rebound, no guarding Ext:  2 plus pulses, no edema, no cyanosis, no clubbing Skin:  No rashes no nodules Neuro:  CN II through XII intact, motor grossly intact  EKG - atrial fib with a controlled VR  Zio monitor - atrial fib with a  4 second pause  Assess/Plan:  Syncope - he is having Stokes Adams attacks and pauses and is on no AV nodal blocking drugs and will undergo PPM insertion. I have discussed the indications/risks/benefits/goals/expectations of the procedure and he wishes to proceed. Because his atrial fib is fairly recent, I will plan to place a DDD device. Atrial fib - his rate is too well controlled on no drugs.  Coags - he will hold his eliquis  after today. HTN - his bp is fairly well controlled. We will consider a beta blocker after the PPM.  Carleene Overlie ,MD

## 2021-08-06 DIAGNOSIS — E7800 Pure hypercholesterolemia, unspecified: Secondary | ICD-10-CM | POA: Diagnosis not present

## 2021-08-06 DIAGNOSIS — I25119 Atherosclerotic heart disease of native coronary artery with unspecified angina pectoris: Secondary | ICD-10-CM

## 2021-08-06 DIAGNOSIS — R55 Syncope and collapse: Secondary | ICD-10-CM

## 2021-08-06 DIAGNOSIS — I10 Essential (primary) hypertension: Secondary | ICD-10-CM

## 2021-08-06 DIAGNOSIS — I480 Paroxysmal atrial fibrillation: Secondary | ICD-10-CM

## 2021-08-08 ENCOUNTER — Encounter (HOSPITAL_COMMUNITY)
Admission: RE | Disposition: A | Discharge status: Home / Self Care | Payer: Self-pay | Source: Home / Self Care | Attending: Internal Medicine

## 2021-08-08 ENCOUNTER — Other Ambulatory Visit: Payer: Self-pay

## 2021-08-08 ENCOUNTER — Ambulatory Visit (HOSPITAL_COMMUNITY)
Admission: RE | Admit: 2021-08-08 | Discharge: 2021-08-08 | Disposition: A | Discharge status: Home / Self Care | Payer: PPO | Attending: Internal Medicine | Admitting: Internal Medicine

## 2021-08-08 ENCOUNTER — Ambulatory Visit (HOSPITAL_COMMUNITY): Payer: PPO

## 2021-08-08 DIAGNOSIS — I10 Essential (primary) hypertension: Secondary | ICD-10-CM | POA: Diagnosis not present

## 2021-08-08 DIAGNOSIS — Z7901 Long term (current) use of anticoagulants: Secondary | ICD-10-CM | POA: Insufficient documentation

## 2021-08-08 DIAGNOSIS — R001 Bradycardia, unspecified: Secondary | ICD-10-CM | POA: Diagnosis not present

## 2021-08-08 DIAGNOSIS — R55 Syncope and collapse: Secondary | ICD-10-CM | POA: Insufficient documentation

## 2021-08-08 DIAGNOSIS — Z950 Presence of cardiac pacemaker: Secondary | ICD-10-CM

## 2021-08-08 DIAGNOSIS — I4891 Unspecified atrial fibrillation: Secondary | ICD-10-CM | POA: Insufficient documentation

## 2021-08-08 DIAGNOSIS — I442 Atrioventricular block, complete: Secondary | ICD-10-CM | POA: Insufficient documentation

## 2021-08-08 DIAGNOSIS — J90 Pleural effusion, not elsewhere classified: Secondary | ICD-10-CM | POA: Diagnosis not present

## 2021-08-08 HISTORY — PX: PACEMAKER IMPLANT: EP1218

## 2021-08-08 SURGERY — PACEMAKER IMPLANT

## 2021-08-08 MED ORDER — ONDANSETRON HCL 4 MG/2ML IJ SOLN: 4.0000 mg | Freq: Four times a day (QID) | INTRAMUSCULAR | Status: DC | PRN

## 2021-08-08 MED ORDER — CEFAZOLIN SODIUM-DEXTROSE 1-4 GM/50ML-% IV SOLN
1.0000 g | Freq: Once | INTRAVENOUS | Status: AC
  Administered 2021-08-08: 1 g via INTRAVENOUS
  Filled 2021-08-08 (×2): qty 50

## 2021-08-08 MED ORDER — LIDOCAINE HCL (PF) 1 % IJ SOLN
INTRAMUSCULAR | Status: AC
  Filled 2021-08-08: qty 60

## 2021-08-08 MED ORDER — SODIUM CHLORIDE 0.9 % IV SOLN
80.0000 mg | INTRAVENOUS | Status: AC
  Administered 2021-08-08: 80 mg

## 2021-08-08 MED ORDER — MIDAZOLAM HCL 5 MG/5ML IJ SOLN
INTRAMUSCULAR | Status: DC | PRN
  Administered 2021-08-08 (×2): 1 mg via INTRAVENOUS

## 2021-08-08 MED ORDER — HEPARIN (PORCINE) IN NACL 1000-0.9 UT/500ML-% IV SOLN
INTRAVENOUS | Status: DC | PRN
  Administered 2021-08-08: 500 mL

## 2021-08-08 MED ORDER — LIDOCAINE HCL (PF) 1 % IJ SOLN
INTRAMUSCULAR | Status: DC | PRN
  Administered 2021-08-08: 60 mL

## 2021-08-08 MED ORDER — HEPARIN (PORCINE) IN NACL 1000-0.9 UT/500ML-% IV SOLN
INTRAVENOUS | Status: AC
  Filled 2021-08-08: qty 500

## 2021-08-08 MED ORDER — FENTANYL CITRATE (PF) 100 MCG/2ML IJ SOLN
INTRAMUSCULAR | Status: AC
  Filled 2021-08-08: qty 2

## 2021-08-08 MED ORDER — SODIUM CHLORIDE 0.9 % IV SOLN: INTRAVENOUS | Status: DC

## 2021-08-08 MED ORDER — MIDAZOLAM HCL 5 MG/5ML IJ SOLN
INTRAMUSCULAR | Status: AC
  Filled 2021-08-08: qty 5

## 2021-08-08 MED ORDER — FENTANYL CITRATE (PF) 100 MCG/2ML IJ SOLN
INTRAMUSCULAR | Status: DC | PRN
  Administered 2021-08-08 (×2): 12.5 ug via INTRAVENOUS

## 2021-08-08 MED ORDER — LOSARTAN POTASSIUM 50 MG PO TABS
50.0000 mg | ORAL_TABLET | Freq: Every day | ORAL | Status: DC
  Administered 2021-08-08: 50 mg via ORAL
  Filled 2021-08-08 (×2): qty 1

## 2021-08-08 MED ORDER — POVIDONE-IODINE 10 % EX SWAB
2.0000 "application " | Freq: Once | CUTANEOUS | Status: AC
  Administered 2021-08-08: 2 via TOPICAL

## 2021-08-08 MED ORDER — HYDRALAZINE HCL 20 MG/ML IJ SOLN: 10.0000 mg | INTRAMUSCULAR | Status: DC | PRN

## 2021-08-08 MED ORDER — SODIUM CHLORIDE 0.9 % IV SOLN
INTRAVENOUS | Status: AC
  Filled 2021-08-08: qty 2

## 2021-08-08 MED ORDER — CEFAZOLIN SODIUM-DEXTROSE 2-4 GM/100ML-% IV SOLN
2.0000 g | INTRAVENOUS | Status: AC
  Administered 2021-08-08: 2 g via INTRAVENOUS

## 2021-08-08 MED ORDER — ACETAMINOPHEN 325 MG PO TABS: 325.0000 mg | ORAL_TABLET | ORAL | Status: DC | PRN

## 2021-08-08 MED ORDER — IOHEXOL 350 MG/ML SOLN
INTRAVENOUS | Status: DC | PRN
  Administered 2021-08-08: 10 mL via INTRAVENOUS

## 2021-08-08 MED ORDER — CEFAZOLIN SODIUM-DEXTROSE 2-4 GM/100ML-% IV SOLN
INTRAVENOUS | Status: AC
  Filled 2021-08-08: qty 100

## 2021-08-08 MED ORDER — CHLORHEXIDINE GLUCONATE 4 % EX LIQD
4.0000 "application " | Freq: Once | CUTANEOUS | Status: DC
  Filled 2021-08-08: qty 60

## 2021-08-08 SURGICAL SUPPLY — 11 items
CABLE SURGICAL S-101-97-12 (CABLE) ×4 IMPLANT
KIT ACCESSORY SELECTRA FIX CVD (MISCELLANEOUS) ×2 IMPLANT
LEAD SELECTRA 3D-55-42 (CATHETERS) ×2 IMPLANT
LEAD SOLIA S PRO MRI 53 (Lead) ×2 IMPLANT
LEAD SOLIA S PRO MRI 60 (Lead) ×2 IMPLANT
PACEMAKER EDORA 8DR-T MRI (Pacemaker) ×2 IMPLANT
PAD DEFIB RADIO PHYSIO CONN (PAD) ×4 IMPLANT
SHEATH 7FR PRELUDE SNAP 13 (SHEATH) ×2 IMPLANT
SHEATH 9FR PRELUDE SNAP 13 (SHEATH) ×2 IMPLANT
TRAY PACEMAKER INSERTION (PACKS) ×4 IMPLANT
WIRE HI TORQ VERSACORE-J 145CM (WIRE) ×2 IMPLANT

## 2021-08-08 NOTE — Interval H&P Note (Signed)
History and Physical Interval Note:  08/08/2021 1:29 PM  Jonathan Griffith  has presented today for surgery, with the diagnosis of bradycardia.  The various methods of treatment have been discussed with the patient and family. After consideration of risks, benefits and other options for treatment, the patient has consented to  Procedure(s): PACEMAKER IMPLANT (N/A) as a surgical intervention.  The patient's history has been reviewed, patient examined, no change in status, stable for surgery.  I have reviewed the patient's chart and labs.  Questions were answered to the patient's satisfaction.     [name]

## 2021-08-08 NOTE — Interval H&P Note (Signed)
History and Physical Interval Note:  08/08/2021 1:29 PM  Jonathan Griffith  has presented today for surgery, with the diagnosis of bradycardia.  The various methods of treatment have been discussed with the patient and family. After consideration of risks, benefits and other options for treatment, the patient has consented to  Procedure(s): PACEMAKER IMPLANT (N/A) as a surgical intervention.  The patient's history has been reviewed, patient examined, no change in status, stable for surgery.  I have reviewed the patient's chart and labs.  Questions were answered to the patient's satisfaction.     Jonathan Griffith

## 2021-08-08 NOTE — Discharge Instructions (Signed)
After Your Pacemaker   You have a Biotronik Pacemaker  ACTIVITY Do not lift your arm above shoulder height for 1 week after your procedure. After 7 days, you may progress as below.  You should remove your sling 24 hours after your procedure, unless otherwise instructed by your provider.     Monday August 15, 2021  Tuesday August 16, 2021 Wednesday August 17, 2021 Thursday August 18, 2021   Do not lift, push, pull, or carry anything over 10 pounds with the affected arm until 6 weeks (Monday September 19, 2021 ) after your procedure.   You may drive AFTER your wound check, unless you have been told otherwise by your provider.   Ask your healthcare provider when you can go back to work   INCISION/Dressing If you are on a blood thinner such as Coumadin, Xarelto, Eliquis, Plavix, or Pradaxa please confirm with your provider when this should be resumed.    If large square, outer bandage is left in place, this can be removed after 24 hours from your procedure. Do not remove steri-strips or glue as below.   Monitor your Pacemaker site for redness, swelling, and drainage. Call the device clinic at [phone] if you experience these symptoms or fever/chills.  If your incision is closed with Dermabond/Surgical glue. You may shower 1 day after your pacemaker implant and wash around the site with soap and water.    If you were discharged in a sling, please do not wear this during the day more than 48 hours after your surgery unless otherwise instructed. This may increase the risk of stiffness and soreness in your shoulder.   Avoid lotions, ointments, or perfumes over your incision until it is well-healed.  You may use a hot tub or a pool AFTER your wound check appointment if the incision is completely closed.  Pacemaker Alerts:  Some alerts are vibratory and others beep. These are NOT emergencies. Please call our office to let us know. If this occurs at night or on weekends, it can wait until  the next business day. Send a remote transmission.  If your device is capable of reading fluid status (for heart failure), you will be offered monthly monitoring to review this with you.   DEVICE MANAGEMENT Remote monitoring is used to monitor your pacemaker from home. This monitoring is scheduled every 91 days by our office. It allows [place] to keep an eye on the functioning of your device to ensure it is working properly. You will routinely see your Electrophysiologist annually (more often if necessary).   You should receive your ID card for your new device in 4-8 weeks. Keep this card with you at all times once received. Consider wearing a medical alert bracelet or necklace.  Your Pacemaker may be MRI compatible. This will be discussed at your next office visit/wound check.  You should avoid contact with strong electric or magnetic fields.   Do not use amateur (ham) radio equipment or electric (arc) welding torches. MP3 player headphones with magnets should not be used. Some devices are safe to use if held at least 12 inches (30 cm) from your Pacemaker. These include power tools, lawn mowers, and speakers. If you are unsure if something is safe to use, ask your health care provider.  When using your cell phone, hold it to the ear that is on the opposite side from the Pacemaker. Do not leave your cell phone in a pocket over the Pacemaker.  You may safely use electric blankets,  heating pads, computers, and microwave ovens.  Call the office right away if: You have chest pain. You feel more short of breath than you have felt before. You feel more light-headed than you have felt before. Your incision starts to open up.  This information is not intended to replace advice given to you by your health care provider. Make sure you discuss any questions you have with your health care provider.

## 2021-08-09 ENCOUNTER — Encounter (HOSPITAL_COMMUNITY): Payer: Self-pay | Admitting: Internal Medicine

## 2021-08-09 ENCOUNTER — Telehealth: Payer: Self-pay

## 2021-08-09 NOTE — Telephone Encounter (Signed)
Pt has PPM placed 08/08/21.

## 2021-08-09 NOTE — Telephone Encounter (Signed)
Follow-up after same day discharge: Implant date: 08/08/21 MD: [name], MD Device: Biotronik dual PPM Location: Left pectoral   Wound check visit: 08/25/21 90 day MD follow-up: 11/08/21  Remote Transmission received:Not yet (monitor only registered this morning)  Pt reprots "ok" is illuminated on device.    Dressing removed: Not yet, patient educated to remove bandage today.

## 2021-08-25 ENCOUNTER — Ambulatory Visit (INDEPENDENT_AMBULATORY_CARE_PROVIDER_SITE_OTHER): Payer: PPO

## 2021-08-25 ENCOUNTER — Other Ambulatory Visit: Payer: Self-pay

## 2021-08-25 DIAGNOSIS — I495 Sick sinus syndrome: Secondary | ICD-10-CM

## 2021-08-25 LAB — CUP PACEART INCLINIC DEVICE CHECK
Brady Statistic RV Percent Paced: 61 %
Date Time Interrogation Session: 20230209172027
Implantable Lead Implant Date: 20230123
Implantable Lead Implant Date: 20230123
Implantable Lead Location: 753859
Implantable Lead Location: 753860
Implantable Lead Model: 377
Implantable Lead Model: 377
Implantable Lead Serial Number: 7000393970
Implantable Lead Serial Number: 8000680133
Implantable Pulse Generator Implant Date: 20230123
Lead Channel Impedance Value: 331 Ohm
Lead Channel Impedance Value: 643 Ohm
Lead Channel Pacing Threshold Amplitude: 0.9 V
Lead Channel Pacing Threshold Amplitude: 0.9 V
Lead Channel Pacing Threshold Amplitude: 0.9 V
Lead Channel Pacing Threshold Pulse Width: 0.4 ms
Lead Channel Pacing Threshold Pulse Width: 0.4 ms
Lead Channel Pacing Threshold Pulse Width: 0.4 ms
Lead Channel Sensing Intrinsic Amplitude: 1.7 mV
Lead Channel Sensing Intrinsic Amplitude: 19.4 mV
Lead Channel Sensing Intrinsic Amplitude: 21 mV
Lead Channel Setting Pacing Amplitude: 3 V
Lead Channel Setting Pacing Pulse Width: 0.4 ms
Pulse Gen Model: 407145
Pulse Gen Serial Number: 70294843

## 2021-08-25 NOTE — Progress Notes (Signed)
Wound check appointment. Steri-strips removed. Wound without redness or edema. Incision edges approximated, wound well healed. Normal device function. Threshold sensing, and impedances consistent with implant measurements. Device programmed at 3.0V on for extra safety margin until 3 month visit. Histogram distribution appropriate for patient and level of activity. No mode switches or high ventricular rates noted. Patient educated about wound care, arm mobility, lifting restrictions. ROV 11/08/21 with GT

## 2021-08-25 NOTE — Patient Instructions (Signed)
° °  After Your Pacemaker   Monitor your pacemaker site for redness, swelling, and drainage. Call the device clinic at [phone] if you experience these symptoms or fever/chills.  Your incision was closed with Steri-strips or staples:  You may shower 7 days after your procedure and wash your incision with soap and water. Avoid lotions, ointments, or perfumes over your incision until it is well-healed.   Do not lift, push or pull greater than 10 pounds with the affected arm until 6 weeks after your procedure. There are no other restrictions in arm movement after your wound check appointment.  You may drive, unless driving has been restricted by your healthcare providers.  Contact [employer] for MRI compatibility   Remote monitoring is used to monitor your pacemaker from home. This monitoring is scheduled every 91 days by our office. It allows [place] to keep an eye on the functioning of your device to ensure it is working properly. You will routinely see your Electrophysiologist annually (more often if necessary).

## 2021-09-07 ENCOUNTER — Ambulatory Visit: Payer: PPO | Admitting: Cardiovascular Disease

## 2021-09-07 ENCOUNTER — Other Ambulatory Visit: Payer: Self-pay

## 2021-09-07 ENCOUNTER — Encounter: Payer: Self-pay | Admitting: Cardiovascular Disease

## 2021-09-07 VITALS — BP 124/82 | HR 60 | Ht 66.0 in | Wt 189.6 lb

## 2021-09-07 DIAGNOSIS — I25119 Atherosclerotic heart disease of native coronary artery with unspecified angina pectoris: Secondary | ICD-10-CM | POA: Diagnosis not present

## 2021-09-07 DIAGNOSIS — I10 Essential (primary) hypertension: Secondary | ICD-10-CM | POA: Diagnosis not present

## 2021-09-07 DIAGNOSIS — E7800 Pure hypercholesterolemia, unspecified: Secondary | ICD-10-CM | POA: Diagnosis not present

## 2021-09-07 DIAGNOSIS — I480 Paroxysmal atrial fibrillation: Secondary | ICD-10-CM

## 2021-09-07 DIAGNOSIS — I495 Sick sinus syndrome: Secondary | ICD-10-CM | POA: Diagnosis not present

## 2021-09-07 NOTE — Patient Instructions (Signed)
Medication Instructions:  °Your physician recommends that you continue on your current medications as directed. Please refer to the Current Medication list given to you today. ° °*If you need a refill on your cardiac medications before your next appointment, please call your pharmacy* ° ° °Lab Work: °NONE °If you have labs (blood work) drawn today and your tests are completely normal, you will receive your results only by: °MyChart Message (if you have MyChart) OR °A paper copy in the mail °If you have any lab test that is abnormal or we need to change your treatment, we will call you to review the results. ° ° °Testing/Procedures: °NONE ° ° °Follow-Up: °At [facility], you and your health needs are our priority.  As part of our continuing mission to provide you with exceptional heart care, we have created designated Provider Care Teams.  These Care Teams include your primary Cardiologist (physician) and Advanced Practice Providers (APPs -  Physician Assistants and Nurse Practitioners) who all work together to provide you with the care you need, when you need it. ° °Your next appointment:   °1 year(s) ° °The format for your next appointment:   °In Person ° °Provider:   °Christopher McAlhany, MD  °

## 2021-09-07 NOTE — Progress Notes (Signed)
Chief Complaint  Patient presents with   Follow-up    CAD   History of Present Illness: 81 yo male with history of CAD, HTN, hyperlipidemia, GERD, atrial fibrillation and sick sinus syndrome s/p pacemaker implant in January 2023 who is here today for cardiac follow up. He is known to have mild to moderate CAD with last cath in March 2018 showing 40% ostial LAD stenosis and mild disease in the RCA and intermediate Aaron. LV function was normal. He has not tolerated Imdur in the past. He had bradycardia with Bystolic so it was stopped. He has been seen in the Pulmonary clinic for workup of dyspnea and is not felt to have significant lung disease. He has described mild chest pain with heavy exertion for years. He did not tolerate Imdur and has not been willing to try Ranexa. Echo 06/28/21 with LVEF=60-65%, no significant valve disease. He recently had a fall and was found to have a rib fracture on the left. Seen in primary care by Dr. Silvio Pate 07/19/20 and atrial fib noted. EKG reviewed by me and shows atrial fib with HR of 59 bpm. He was started on Eliquis. Cardiac monitor with sinus pause in January 2023. Given near syncope, pacemaker implanted January 2023.   He is here today for follow up. The patient denies any chest pain, dyspnea, palpitations, lower extremity edema, orthopnea, PND, dizziness, near syncope or syncope.   Primary Care Physician: Venia Carbon, MD  Past Medical History:  Diagnosis Date   Allergic rhinitis    Anginal pain (HCC)    CAD (coronary artery disease)    Chest pain    Duodenal ulcer    Dyspnea    On exertions   ED (erectile dysfunction)    GERD (gastroesophageal reflux disease)    History of colonoscopy    Hyperlipidemia    Hypertension    Osteoarthritis     Past Surgical History:  Procedure Laterality Date   APPENDECTOMY  1964   CARDIAC CATHETERIZATION  03/2010   diffuse early disease   CATARACT EXTRACTION Right 04/18/2016   INGUINAL HERNIA REPAIR  Bilateral 1970   double hernia repair    LEFT HEART CATH AND CORONARY ANGIOGRAPHY N/A 10/12/2016   Procedure: Left Heart Cath and Coronary Angiography;  Surgeon: Burnell Blanks, MD;  Location: [facility];  Service: Cardiovascular;  Laterality: N/A;   PACEMAKER IMPLANT N/A 08/08/2021   Procedure: PACEMAKER IMPLANT;  Surgeon: Evans Lance, MD;  Location: [facility];  Service: Cardiovascular;  Laterality: N/A;   RETINAL DETACHMENT SURGERY Right 2003   TONSILLECTOMY     TOTAL KNEE ARTHROPLASTY Right 09/10/2020   Procedure: RIGHT TOTAL KNEE ARTHROPLASTY;  Surgeon: Mcarthur Rossetti, MD;  Location: [facility];  Service: Orthopedics;  Laterality: Right;    Current Outpatient Medications  Medication Sig Dispense Refill   ALPHAGAN P 0.1 % SOLN Place 1 drop into the right eye in the morning.     apixaban (ELIQUIS) 5 MG TABS tablet Take 1 tablet (5 mg total) by mouth 2 (two) times daily. 60 tablet 11   Cholecalciferol (VITAMIN D-3) 125 MCG (5000 UT) TABS Take 5,000 Units by mouth at bedtime.     ILEVRO 0.3 % ophthalmic suspension Place 1 drop into the right eye at bedtime.     losartan (COZAAR) 50 MG tablet TAKE 1 TABLET BY MOUTH EVERY DAY (Patient taking differently: Take 50 mg by mouth every evening.) 90 tablet 3   magnesium  oxide (MAG-OX) 400 MG tablet Take 400 mg by mouth at bedtime.     Multiple Vitamins-Minerals (MULTIVITAMIN WITH MINERALS) tablet Take 1 tablet by mouth at bedtime.     nitroGLYCERIN (NITROSTAT) 0.4 MG SL tablet Place 1 tablet (0.4 mg total) under the tongue every 5 (five) minutes as needed for chest pain (x 3 doses). 25 tablet 3   prednisoLONE acetate (PRED FORTE) 1 % ophthalmic suspension Place 1 drop into the right eye in the morning and at bedtime.     simvastatin (ZOCOR) 40 MG tablet TAKE ONE TABLET BY MOUTH EACH EVENING 90 tablet 3   vitamin B-12 (CYANOCOBALAMIN) 500 MCG tablet Take 500 mcg by mouth in the morning.     omeprazole (PRILOSEC) 20 MG  capsule TAKE 1 CAPSULE (20 MG TOTAL) BY MOUTH DAILY. ON AN EMPTY STOMACH (Patient not taking: Reported on 08/05/2021) 90 capsule 3   PROLENSA 0.07 % SOLN SMARTSIG:1 Drop(s) Right Eye Every Evening     No current facility-administered medications for this visit.    Allergies  Allergen Reactions   Ibuprofen     REACTION: ulcer    Social History   Socioeconomic History   Marital status: Married    Spouse name: Not on file   Number of children: 2   Years of education: Not on file   Highest education level: Not on file  Occupational History   Occupation: Administrator, [employer] Service    Comment: retired  Tobacco Use   Smoking status: Former    Packs/day: 1.00    Years: 10.00    Pack years: 10.00    Types: Cigars, Cigarettes    Quit date: 07/18/2007    Years since quitting: 14.1   Smokeless tobacco: Never   Tobacco comments:    Occ cigar - no cigarettes - stopped in 6-09  Vaping Use   Vaping Use: Never used  Substance and Sexual Activity   Alcohol use: Yes    Alcohol/week: 1.0 standard drink    Types: 1 Standard drinks or equivalent per week    Comment: 3-4 drinks per week   Drug use: No   Sexual activity: Not on file  Other Topics Concern   Not on file  Social History Narrative   Has living will   Wife would serve as health care POA--then son   Would accept resuscitation attempts but no prolonged artificial ventilation    No tube feeds if cognitively unaware   Social Determinants of Health   Financial Resource Strain: Not on file  Food Insecurity: Not on file  Transportation Needs: Not on file  Physical Activity: Not on file  Stress: Not on file  Social Connections: Not on file  Intimate Partner Violence: Not on file    Family History  Problem Relation Age of Onset   Coronary artery disease Mother        CABG in 72's   Brain cancer Mother         that was metastatic  from lung    Stomach cancer Father 53   Diabetes Sister    Stroke Maternal Grandmother     Review of  Systems:  As stated in the HPI and otherwise negative.   BP 124/82    Pulse 60    Ht 5\' 6"  (1.676 m)    Wt 189 lb 9.6 oz (86 kg)    SpO2 97%    BMI 30.60 kg/m   Physical Examination:  General: Well developed, well nourished, NAD  HEENT: OP  clear, mucus membranes moist  SKIN: warm, dry. No rashes. Neuro: No focal deficits  Musculoskeletal: Muscle strength 5/5 all ext  Psychiatric: Mood and affect normal  Neck: No JVD, no carotid bruits, no thyromegaly, no lymphadenopathy.  Lungs:Clear bilaterally, no wheezes, rhonci, crackles Cardiovascular: Regular rate and rhythm. No murmurs, gallops or rubs. Abdomen:Soft. Bowel sounds present. Non-tender.  Extremities: No lower extremity edema. Pulses are 2 + in the bilateral DP/PT.  EKG:  EKG is not ordered today. The ekg ordered today demonstrates   Echo 06/28/21:  1. Left ventricular ejection fraction, by estimation, is 60 to 65%. The  left ventricle has normal function. The left ventricle has no regional  wall motion abnormalities. There is mild concentric left ventricular  hypertrophy. Left ventricular diastolic  parameters are indeterminate.   2. Right ventricular systolic function is normal. The right ventricular  size is normal. Tricuspid regurgitation signal is inadequate for assessing  PA pressure.   3. Left atrial size was mildly dilated.   4. The mitral valve is normal in structure. Trivial mitral valve  regurgitation.   5. The aortic valve is tricuspid. There is mild calcification of the  aortic valve. There is mild thickening of the aortic valve. Aortic valve  regurgitation is trivial. Aortic valve sclerosis/calcification is present,  without any evidence of aortic  stenosis.   6. Aortic dilatation noted. There is mild dilatation of the aortic root,  measuring 39 mm.   7. The inferior vena cava is normal in size with greater than 50%  respiratory variability, suggesting right atrial pressure of 3 mmHg.   Recent  Labs: 08/05/2021: BUN 19; Creatinine, Ser 1.12; Hemoglobin 16.1; Platelets 275; Potassium 4.7; Sodium 141   Lipid Panel    Component Value Date/Time   CHOL 144 10/10/2019 0804   TRIG 105.0 10/10/2019 0804   HDL 44.70 10/10/2019 0804   CHOLHDL 3 10/10/2019 0804   VLDL 21.0 10/10/2019 0804   LDLCALC 79 10/10/2019 0804   LDLDIRECT 131.3 04/06/2009 0922     Wt Readings from Last 3 Encounters:  09/07/21 189 lb 9.6 oz (86 kg)  08/08/21 185 lb (83.9 kg)  08/05/21 185 lb 12.8 oz (84.3 kg)     Other studies Reviewed: Additional studies/ records that were reviewed today include: . Review of the above records demonstrates:    Assessment and Plan:   1. CAD with stable angina:  He has chronic chest pain. No recent change in his baseline chest pains. His angina is felt to be due to microvascular disease. Cardiac cath in March 2018 with stable mild CAD. No beta blocker due to bradycardia in past while on beta blockers.  He never tried Ranexa. He did not tolerate Imdur. Will continue statin. He is off of ASA since he is now on Eliquis.   2. HTN: BP is well controlled. No changes  3. Hyperlipidemia: Lipids followed in primary care. Continue statin. He will have his lipids and LFTs checked at his primary care visit April 1,2023. He wishes to wait until then for labs.   4. Atrial fibrillation, new onset: He is in sinus today on exam.  Continue Eliquis.    5. Sick sinus syndrome: Pacemaker in place.   Current medicines are reviewed at length with the patient today.  The patient does not have concerns regarding medicines.  The following changes have been made:  no change  Labs/ tests ordered today include:   No orders of the defined types were placed in this encounter.  Disposition:   F/U with me in 12 months.    Signed, Lauree Chandler, MD 09/07/2021 11:03 AM    [facility] [place], [place], [place]  [address] Phone: [phone]; Fax: [phone]

## 2021-10-18 ENCOUNTER — Ambulatory Visit (INDEPENDENT_AMBULATORY_CARE_PROVIDER_SITE_OTHER): Payer: PPO | Admitting: Internal Medicine

## 2021-10-18 ENCOUNTER — Encounter: Payer: Self-pay | Admitting: Internal Medicine

## 2021-10-18 VITALS — BP 140/84 | HR 61 | Temp 97.2°F | Ht 65.5 in | Wt 189.0 lb

## 2021-10-18 DIAGNOSIS — I480 Paroxysmal atrial fibrillation: Secondary | ICD-10-CM

## 2021-10-18 DIAGNOSIS — Z0000 Encounter for general adult medical examination without abnormal findings: Secondary | ICD-10-CM | POA: Diagnosis not present

## 2021-10-18 DIAGNOSIS — I25119 Atherosclerotic heart disease of native coronary artery with unspecified angina pectoris: Secondary | ICD-10-CM | POA: Diagnosis not present

## 2021-10-18 DIAGNOSIS — K219 Gastro-esophageal reflux disease without esophagitis: Secondary | ICD-10-CM | POA: Diagnosis not present

## 2021-10-18 DIAGNOSIS — Z7189 Other specified counseling: Secondary | ICD-10-CM

## 2021-10-18 LAB — COMPREHENSIVE METABOLIC PANEL
ALT: 12 U/L (ref 0–53)
AST: 20 U/L (ref 0–37)
Albumin: 4.6 g/dL (ref 3.5–5.2)
Alkaline Phosphatase: 94 U/L (ref 39–117)
BUN: 26 mg/dL — ABNORMAL HIGH (ref 6–23)
CO2: 30 mEq/L (ref 19–32)
Calcium: 9.5 mg/dL (ref 8.4–10.5)
Chloride: 100 mEq/L (ref 96–112)
Creatinine, Ser: 1.27 mg/dL (ref 0.40–1.50)
GFR: 52.74 mL/min — ABNORMAL LOW (ref >60.00)
Glucose, Bld: 105 mg/dL — ABNORMAL HIGH (ref 70–99)
Potassium: 4.6 mEq/L (ref 3.5–5.1)
Sodium: 139 mEq/L (ref 135–145)
Total Bilirubin: 1.2 mg/dL (ref 0.2–1.2)
Total Protein: 6.8 g/dL (ref 6.0–8.3)

## 2021-10-18 LAB — T4, FREE: Free T4: 1.16 ng/dL (ref 0.60–1.60)

## 2021-10-18 LAB — CBC
HCT: 47.4 % (ref 39.0–52.0)
Hemoglobin: 15.8 g/dL (ref 13.0–17.0)
MCHC: 33.4 g/dL (ref 30.0–36.0)
MCV: 88.4 fl (ref 78.0–100.0)
Platelets: 223 10*3/uL (ref 150.0–400.0)
RBC: 5.36 Mil/uL (ref 4.22–5.81)
RDW: 14.1 % (ref 11.5–15.5)
WBC: 6.2 10*3/uL (ref 4.0–10.5)

## 2021-10-18 LAB — LIPID PANEL
Cholesterol: 147 mg/dL (ref 0–200)
HDL: 49.3 mg/dL (ref >39.00)
LDL Cholesterol: 63 mg/dL (ref 0–99)
NonHDL: 97.7
Total CHOL/HDL Ratio: 3
Triglycerides: 175 mg/dL — ABNORMAL HIGH (ref 0.0–149.0)
VLDL: 35 mg/dL (ref 0.0–40.0)

## 2021-10-18 LAB — VITAMIN B12: Vitamin B-12: 1047 pg/mL — ABNORMAL HIGH (ref 211–911)

## 2021-10-18 MED ORDER — OMEPRAZOLE 20 MG PO CPDR: 20.0000 mg | DELAYED_RELEASE_CAPSULE | Freq: Every day | ORAL | 0 refills | Status: AC | PRN

## 2021-10-18 NOTE — Assessment & Plan Note (Signed)
Paced now ?eliquis 5 bid ?

## 2021-10-18 NOTE — Progress Notes (Signed)
Hearing Screening - Comments:: Passed whisper test Vision Screening - Comments:: November 2022  

## 2021-10-18 NOTE — Progress Notes (Signed)
? ?Subjective:  ? ? Patient ID: Jonathan Griffith, male    DOB: [birth year], 81 y.o.   MRN: [id number] ? ?HPI ?Here for Medicare wellness visit and follow up of chronic health conditions ?Reviewed advanced directives ?Reviewed other doctors--Dr McAlhany--cardiology, Dr Alexis Goodell, Dr Loleta Chance, Dr Romeo Rabon, Ms Haverstock--derm ?Only surgery was a pacemaker. No other hospitalizations ?Vision is okay ?Hearing is fine ?Occasional alcohol ?No tobacco ?[facility] ?Fell one--tangled in wires in yard. No injury ?No depression or anhedonia ?Independent with instrumental ADLs ?No sig memory issues ? ? ?Has noticed feeling swimmy headed---first thing getting out of bed in the morning. Sits on side of bed--occ needs to lie back down ?Can occur out in the yard if he bends down and gets back up ?No syncope ?Did have pacemaker in January ? ?Has stable DOE--mostly just walking up hill ?Will occasionally have fleeting chest pain--never used nitro ?No palpitations ?No edema ? ?Some left heel pain---wonders if it is his shoes ?Classic first thing on getting up--discussed proper arch support ? ?Still has some lateral right knee numbness ?No sig pain or issues since the TKR ?Uses rare aleve or tylenol ? ?No heartburn lately ?Has the omeprazole for prn use---hasn't needed ?No dysphagia ? ?Current Outpatient Medications on File Prior to Visit  ?Medication Sig Dispense Refill  ? ALPHAGAN P 0.1 % SOLN Place 1 drop into the right eye in the morning.    ? apixaban (ELIQUIS) 5 MG TABS tablet Take 1 tablet (5 mg total) by mouth 2 (two) times daily. 60 tablet 11  ? Cholecalciferol (VITAMIN D-3) 125 MCG (5000 UT) TABS Take 5,000 Units by mouth at bedtime.    ? ILEVRO 0.3 % ophthalmic suspension Place 1 drop into the right eye at bedtime.    ? losartan (COZAAR) 50 MG tablet TAKE 1 TABLET BY MOUTH EVERY DAY (Patient taking differently: Take 50 mg by mouth every evening.) 90 tablet 3  ? magnesium oxide (MAG-OX) 400 MG  tablet Take 400 mg by mouth at bedtime.    ? Multiple Vitamins-Minerals (MULTIVITAMIN WITH MINERALS) tablet Take 1 tablet by mouth at bedtime.    ? nitroGLYCERIN (NITROSTAT) 0.4 MG SL tablet Place 1 tablet (0.4 mg total) under the tongue every 5 (five) minutes as needed for chest pain (x 3 doses). 25 tablet 3  ? prednisoLONE acetate (PRED FORTE) 1 % ophthalmic suspension Place 1 drop into the right eye in the morning and at bedtime.    ? PROLENSA 0.07 % SOLN SMARTSIG:1 Drop(s) Right Eye Every Evening    ? simvastatin (ZOCOR) 40 MG tablet TAKE ONE TABLET BY MOUTH EACH EVENING 90 tablet 3  ? vitamin B-12 (CYANOCOBALAMIN) 500 MCG tablet Take 500 mcg by mouth in the morning.    ? ?No current facility-administered medications on file prior to visit.  ? ? ?Allergies  ?Allergen Reactions  ? Ibuprofen   ?  REACTION: ulcer  ? ? ?Past Medical History:  ?Diagnosis Date  ? Allergic rhinitis   ? Anginal pain ([place])   ? CAD (coronary artery disease)   ? Chest pain   ? Duodenal ulcer   ? Dyspnea   ? On exertions  ? ED (erectile dysfunction)   ? GERD (gastroesophageal reflux disease)   ? History of colonoscopy   ? Hyperlipidemia   ? Hypertension   ? Osteoarthritis   ? ? ?Past Surgical History:  ?Procedure Laterality Date  ? APPENDECTOMY  1964  ? CARDIAC CATHETERIZATION  03/2010  ?  diffuse early disease  ? CATARACT EXTRACTION Right 04/18/2016  ? INGUINAL HERNIA REPAIR Bilateral 1970  ? double hernia repair   ? LEFT HEART CATH AND CORONARY ANGIOGRAPHY N/A 10/12/2016  ? Procedure: Left Heart Cath and Coronary Angiography;  Surgeon: Burnell Blanks, MD;  Location: [facility];  Service: Cardiovascular;  Laterality: N/A;  ? PACEMAKER IMPLANT N/A 08/08/2021  ? Procedure: PACEMAKER IMPLANT;  Surgeon: Evans Lance, MD;  Location: [facility];  Service: Cardiovascular;  Laterality: N/A;  ? RETINAL DETACHMENT SURGERY Right 2003  ? TONSILLECTOMY    ? TOTAL KNEE ARTHROPLASTY Right 09/10/2020  ? Procedure: RIGHT TOTAL KNEE  ARTHROPLASTY;  Surgeon: Mcarthur Rossetti, MD;  Location: [facility];  Service: Orthopedics;  Laterality: Right;  ? ? ?Family History  ?Problem Relation Age of Onset  ? Coronary artery disease Mother   ?     CABG in 60's  ? Brain cancer Mother   ?      that was metastatic  from lung   ? Stomach cancer Father 12  ? Diabetes Sister   ? Stroke Maternal Grandmother   ? ? ?Social History  ? ?Socioeconomic History  ? Marital status: Married  ?  Spouse name: Not on file  ? Number of children: 2  ? Years of education: Not on file  ? Highest education level: Not on file  ?Occupational History  ? Occupation: Administrator, [employer]  ?  Comment: retired  ?Tobacco Use  ? Smoking status: Former  ?  Packs/day: 1.00  ?  Years: 10.00  ?  Pack years: 10.00  ?  Types: Cigars, Cigarettes  ?  Quit date: 07/18/2007  ?  Years since quitting: 14.2  ?  Passive exposure: Past  ? Smokeless tobacco: Never  ? Tobacco comments:  ?  Occ cigar - no cigarettes - stopped in 6-09  ?Vaping Use  ? Vaping Use: Never used  ?Substance and Sexual Activity  ? Alcohol use: Yes  ?  Alcohol/week: 1.0 standard drink  ?  Types: 1 Standard drinks or equivalent per week  ?  Comment: 3-4 drinks per week  ? Drug use: No  ? Sexual activity: Not on file  ?Other Topics Concern  ? Not on file  ?Social History Narrative  ? Has living will  ? Wife would serve as health care POA--then son  ? Would accept resuscitation attempts but no prolonged artificial ventilation   ? No tube feeds if cognitively unaware  ? ?Social Determinants of Health  ? ?Financial Resource Strain: Not on file  ?Food Insecurity: Not on file  ?Transportation Needs: Not on file  ?Physical Activity: Not on file  ?Stress: Not on file  ?Social Connections: Not on file  ?Intimate Partner Violence: Not on file  ? ?Review of Systems ?Appetite is variable ?Weight is stable ?Sleep is not great still--nothing new. Nap in day at times ?Wears seat belt ?Full dentures---doesn't need dentist ?Bowels move okay--no blood. Does  get gas at times ?No suspicious skin lesions ?No other joint pains ? ?   ?Objective:  ? Physical Exam ?Constitutional:   ?   Appearance: Normal appearance.  ?HENT:  ?   Mouth/Throat:  ?   Comments: No lesions ?Eyes:  ?   Conjunctiva/sclera: Conjunctivae normal.  ?   Pupils: Pupils are equal, round, and reactive to light.  ?   Comments: Left exotropia  ?Cardiovascular:  ?   Rate and Rhythm: Normal rate and regular rhythm.  ?  Pulses: Normal pulses.  ?   Heart sounds: No murmur heard. ?  No gallop.  ?Pulmonary:  ?   Effort: Pulmonary effort is normal.  ?   Breath sounds: Normal breath sounds. No wheezing or rales.  ?Abdominal:  ?   Palpations: Abdomen is soft.  ?   Tenderness: There is no abdominal tenderness.  ?Musculoskeletal:  ?   Cervical back: Neck supple.  ?   Right lower leg: No edema.  ?   Left lower leg: No edema.  ?Lymphadenopathy:  ?   Cervical: No cervical adenopathy.  ?Skin: ?   Findings: No lesion or rash.  ?Neurological:  ?   General: No focal deficit present.  ?   Mental Status: He is alert and oriented to person, place, and time.  ?   Comments: Mini-Cog--- clock not correct, recall 3/3  ?Psychiatric:     ?   Mood and Affect: Mood normal.     ?   Behavior: Behavior normal.  ?  ? ? ? ? ?   ?Assessment & Plan:  ? ?

## 2021-10-18 NOTE — Assessment & Plan Note (Signed)
See social history 

## 2021-10-18 NOTE — Assessment & Plan Note (Signed)
Better now ?Uses the omeprazole 20 daily prn ?

## 2021-10-18 NOTE — Assessment & Plan Note (Signed)
Stable angina pattern ?On simvastatin 40, losartan 50, eliquis ?Hasn't needed nitro ?

## 2021-10-18 NOTE — Assessment & Plan Note (Addendum)
I have personally reviewed the Medicare Annual Wellness questionnaire and have noted ?1. The patient's medical and social history ?2. Their use of alcohol, tobacco or illicit drugs ?3. Their current medications and supplements ?4. The patient's functional ability including ADL's, fall risks, home safety risks and hearing or visual ?            impairment. ?5. Diet and physical activities ?6. Evidence for depression or mood disorders ? ?The patients weight, height, BMI and visual acuity have been recorded in the chart ?I have made referrals, counseling and provided education to the patient based review of the above and I have provided the pt with a written personalized care plan for preventive services. ? ?I have provided you with a copy of your personalized plan for preventive services. Please take the time to review along with your updated medication list. ? ?Done with cancer screening due to age ?Not great on mini-cog but no apparent cognitive limitations ?Flu vaccine in the fall---COVID bivalent booster also ?Exercises regularly ?Needs 2nd shingrix at pharmacy ?

## 2021-11-07 ENCOUNTER — Ambulatory Visit (INDEPENDENT_AMBULATORY_CARE_PROVIDER_SITE_OTHER): Payer: PPO

## 2021-11-07 DIAGNOSIS — I495 Sick sinus syndrome: Secondary | ICD-10-CM | POA: Diagnosis not present

## 2021-11-08 ENCOUNTER — Ambulatory Visit: Payer: PPO | Admitting: Internal Medicine

## 2021-11-08 VITALS — BP 132/72 | HR 60 | Ht 65.0 in | Wt 186.4 lb

## 2021-11-08 DIAGNOSIS — I480 Paroxysmal atrial fibrillation: Secondary | ICD-10-CM

## 2021-11-08 DIAGNOSIS — I459 Conduction disorder, unspecified: Secondary | ICD-10-CM | POA: Diagnosis not present

## 2021-11-08 DIAGNOSIS — Z950 Presence of cardiac pacemaker: Secondary | ICD-10-CM

## 2021-11-08 DIAGNOSIS — I10 Essential (primary) hypertension: Secondary | ICD-10-CM | POA: Diagnosis not present

## 2021-11-08 LAB — CUP PACEART REMOTE DEVICE CHECK
Date Time Interrogation Session: 20230425073211
Implantable Lead Implant Date: 20230123
Implantable Lead Implant Date: 20230123
Implantable Lead Location: 753859
Implantable Lead Location: 753860
Implantable Lead Model: 377
Implantable Lead Model: 377
Implantable Lead Serial Number: 7000393970
Implantable Lead Serial Number: 8000680133
Implantable Pulse Generator Implant Date: 20230123
Pulse Gen Model: 407145
Pulse Gen Serial Number: 70294843

## 2021-11-08 NOTE — Patient Instructions (Signed)
Medication Instructions:  ?Your physician recommends that you continue on your current medications as directed. Please refer to the Current Medication list given to you today. ? ?Labwork: ?None ordered. ? ?Testing/Procedures: ?None ordered. ? ?Follow-Up: ?Your physician wants you to follow-up in: one year with [name], MD or one of the following Advanced Practice Providers on your designated Care Team:   ?Tommye Standard, PA-C ?Legrand Como "Jonni Sanger" [place], PA-C ? ?Remote monitoring is used to monitor your Pacemaker from home. This monitoring reduces the number of office visits required to check your device to one time per year. It allows [place] to keep an eye on the functioning of your device to ensure it is working properly. You are scheduled for a device check from home on 02/06/2022. You may send your transmission at any time that day. If you have a wireless device, the transmission will be sent automatically. After your physician reviews your transmission, you will receive a postcard with your next transmission date. ? ?Any Other Special Instructions Will Be Listed Below (If Applicable). ? ?If you need a refill on your cardiac medications before your next appointment, please call your pharmacy.  ? ?Important Information About Sugar ? ? ? ? ? ? ? ?

## 2021-11-08 NOTE — Progress Notes (Signed)
? ? ? ? ?HPI ?[name] returns today for ongoing evaluation of syncope, s/p PPM insertion. He is a pleasant 81 yo man with a h/o atrial fib and developed a syncopal episode and underwent PPM insertion on 08/08/21 after an ILR demonstrated 4 second daytime pauses.  In the interim, he notes no additional syncope.  ?Allergies  ?Allergen Reactions  ? Ibuprofen   ?  REACTION: ulcer  ? ? ? ?Current Outpatient Medications  ?Medication Sig Dispense Refill  ? ALPHAGAN P 0.1 % SOLN Place 1 drop into the right eye in the morning.    ? apixaban (ELIQUIS) 5 MG TABS tablet Take 1 tablet (5 mg total) by mouth 2 (two) times daily. 60 tablet 11  ? Cholecalciferol (VITAMIN D-3) 125 MCG (5000 UT) TABS Take 5,000 Units by mouth at bedtime.    ? ILEVRO 0.3 % ophthalmic suspension Place 1 drop into the right eye at bedtime.    ? losartan (COZAAR) 50 MG tablet TAKE 1 TABLET BY MOUTH EVERY DAY (Patient taking differently: Take 50 mg by mouth every evening.) 90 tablet 3  ? magnesium oxide (MAG-OX) 400 MG tablet Take 400 mg by mouth at bedtime.    ? Multiple Vitamins-Minerals (MULTIVITAMIN WITH MINERALS) tablet Take 1 tablet by mouth at bedtime.    ? nitroGLYCERIN (NITROSTAT) 0.4 MG SL tablet Place 1 tablet (0.4 mg total) under the tongue every 5 (five) minutes as needed for chest pain (x 3 doses). 25 tablet 3  ? omeprazole (PRILOSEC) 20 MG capsule Take 1 capsule (20 mg total) by mouth daily as needed. On an empty stomach 1 capsule 0  ? prednisoLONE acetate (PRED FORTE) 1 % ophthalmic suspension Place 1 drop into the right eye in the morning and at bedtime.    ? PROLENSA 0.07 % SOLN SMARTSIG:1 Drop(s) Right Eye Every Evening    ? simvastatin (ZOCOR) 40 MG tablet TAKE ONE TABLET BY MOUTH EACH EVENING 90 tablet 3  ? vitamin B-12 (CYANOCOBALAMIN) 500 MCG tablet Take 500 mcg by mouth in the morning.    ? ?No current facility-administered medications for this visit.  ? ? ? ?Past Medical History:  ?Diagnosis Date  ? Allergic rhinitis   ? Anginal  pain ([place])   ? CAD (coronary artery disease)   ? Chest pain   ? Duodenal ulcer   ? Dyspnea   ? On exertions  ? ED (erectile dysfunction)   ? GERD (gastroesophageal reflux disease)   ? History of colonoscopy   ? Hyperlipidemia   ? Hypertension   ? Osteoarthritis   ? ? ?ROS: ? ? All systems reviewed and negative except as noted in the HPI. ? ? ?Past Surgical History:  ?Procedure Laterality Date  ? APPENDECTOMY  1964  ? CARDIAC CATHETERIZATION  03/2010  ? diffuse early disease  ? CATARACT EXTRACTION Right 04/18/2016  ? INGUINAL HERNIA REPAIR Bilateral 1970  ? double hernia repair   ? LEFT HEART CATH AND CORONARY ANGIOGRAPHY N/A 10/12/2016  ? Procedure: Left Heart Cath and Coronary Angiography;  Surgeon: Burnell Blanks, MD;  Location: [facility];  Service: Cardiovascular;  Laterality: N/A;  ? PACEMAKER IMPLANT N/A 08/08/2021  ? Procedure: PACEMAKER IMPLANT;  Surgeon: Evans Lance, MD;  Location: [facility];  Service: Cardiovascular;  Laterality: N/A;  ? RETINAL DETACHMENT SURGERY Right 2003  ? TONSILLECTOMY    ? TOTAL KNEE ARTHROPLASTY Right 09/10/2020  ? Procedure: RIGHT TOTAL KNEE ARTHROPLASTY;  Surgeon: Mcarthur Rossetti, MD;  Location: [facility];  Service: Orthopedics;  Laterality: Right;  ? ? ? ?Family History  ?Problem Relation Age of Onset  ? Coronary artery disease Mother   ?     CABG in 60's  ? Brain cancer Mother   ?      that was metastatic  from lung   ? Stomach cancer Father 95  ? Diabetes Sister   ? Stroke Maternal Grandmother   ? ? ? ?Social History  ? ?Socioeconomic History  ? Marital status: Married  ?  Spouse name: Not on file  ? Number of children: 2  ? Years of education: Not on file  ? Highest education level: Not on file  ?Occupational History  ? Occupation: Administrator, [employer]  ?  Comment: retired  ?Tobacco Use  ? Smoking status: Former  ?  Packs/day: 1.00  ?  Years: 10.00  ?  Pack years: 10.00  ?  Types: Cigars, Cigarettes  ?  Quit date: 07/18/2007  ?  Years since quitting: 14.3   ?  Passive exposure: Past  ? Smokeless tobacco: Never  ? Tobacco comments:  ?  Occ cigar - no cigarettes - stopped in 6-09  ?Vaping Use  ? Vaping Use: Never used  ?Substance and Sexual Activity  ? Alcohol use: Yes  ?  Alcohol/week: 1.0 standard drink  ?  Types: 1 Standard drinks or equivalent per week  ?  Comment: 3-4 drinks per week  ? Drug use: No  ? Sexual activity: Not on file  ?Other Topics Concern  ? Not on file  ?Social History Narrative  ? Has living will  ? Wife would serve as health care POA--then son  ? Would accept resuscitation attempts but no prolonged artificial ventilation   ? No tube feeds if cognitively unaware  ? ?Social Determinants of Health  ? ?Financial Resource Strain: Not on file  ?Food Insecurity: Not on file  ?Transportation Needs: Not on file  ?Physical Activity: Not on file  ?Stress: Not on file  ?Social Connections: Not on file  ?Intimate Partner Violence: Not on file  ? ? ? ?BP 132/72   Pulse 60   Ht '5\' 5"'$  (1.651 m)   Wt 186 lb 6.4 oz (84.6 kg)   SpO2 97%   BMI 31.02 kg/m?  ? ?Physical Exam: ? ?Well appearing NAD ?HEENT: Unremarkable ?Neck:  No JVD, no thyromegally ?Lymphatics:  No adenopathy ?Back:  No CVA tenderness ?Lungs:  Clear with no wheezes ?HEART:  Regular rate rhythm, no murmurs, no rubs, no clicks ?Abd:  soft, positive bowel sounds, no organomegally, no rebound, no guarding ?Ext:  2 plus pulses, no edema, no cyanosis, no clubbing ?Skin:  No rashes no nodules ?Neuro:  CN II through XII intact, motor grossly intact ? ?EKG - atrial fib with ventricular pacing ? ?DEVICE  ?Normal device function.  See PaceArt for details.  ? ?Assess/Plan:  ? ?Syncope - he had [place] attacks and has undergone PPM insertion. Since his PPM insertion, he has done well with no syncope ?Atrial fib - his rate is well controlled on no drugs.  ?Coags - he will hold his eliquis after today. ?HTN - his bp is fairly well controlled. No indication for additional medical therapy. ?  ?Carleene Overlie  ,MD ?

## 2021-11-21 ENCOUNTER — Encounter (INDEPENDENT_AMBULATORY_CARE_PROVIDER_SITE_OTHER): Payer: PPO | Admitting: Ophthalmology

## 2021-11-21 DIAGNOSIS — H338 Other retinal detachments: Secondary | ICD-10-CM | POA: Diagnosis not present

## 2021-11-21 DIAGNOSIS — H35371 Puckering of macula, right eye: Secondary | ICD-10-CM

## 2021-11-21 DIAGNOSIS — H43813 Vitreous degeneration, bilateral: Secondary | ICD-10-CM | POA: Diagnosis not present

## 2021-11-21 DIAGNOSIS — H59031 Cystoid macular edema following cataract surgery, right eye: Secondary | ICD-10-CM

## 2021-11-21 DIAGNOSIS — I10 Essential (primary) hypertension: Secondary | ICD-10-CM

## 2021-11-21 DIAGNOSIS — H35033 Hypertensive retinopathy, bilateral: Secondary | ICD-10-CM | POA: Diagnosis not present

## 2021-11-22 NOTE — Progress Notes (Signed)
Remote pacemaker transmission.   

## 2022-02-01 ENCOUNTER — Other Ambulatory Visit: Payer: Self-pay | Admitting: Internal Medicine

## 2022-02-06 ENCOUNTER — Ambulatory Visit (INDEPENDENT_AMBULATORY_CARE_PROVIDER_SITE_OTHER): Payer: PPO

## 2022-02-06 DIAGNOSIS — I459 Conduction disorder, unspecified: Secondary | ICD-10-CM | POA: Diagnosis not present

## 2022-02-06 LAB — CUP PACEART REMOTE DEVICE CHECK
Date Time Interrogation Session: 20230724124325
Implantable Lead Implant Date: 20230123
Implantable Lead Implant Date: 20230123
Implantable Lead Location: 753859
Implantable Lead Location: 753860
Implantable Lead Model: 377
Implantable Lead Model: 377
Implantable Lead Serial Number: 7000393970
Implantable Lead Serial Number: 8000680133
Implantable Pulse Generator Implant Date: 20230123
Pulse Gen Model: 407145
Pulse Gen Serial Number: 70294843

## 2022-02-21 ENCOUNTER — Telehealth: Payer: Self-pay | Admitting: Internal Medicine

## 2022-02-21 NOTE — Telephone Encounter (Signed)
Patient called in and stated that he needed to talk to someone regarding apixaban (ELIQUIS) 5 MG TABS tablet. Patient stated that the medication is costing to much and continues to go up. He wants to know if there is something else he do or take. Please advise. Thank you!

## 2022-02-22 NOTE — Telephone Encounter (Signed)
Left message on voicemail for patient to call the office back. Need to confirm medication that he called back about.

## 2022-02-22 NOTE — Telephone Encounter (Signed)
Spoke to patient by telephone and was advised that he is in the donut hole for the rest of the year. Patient stated that the cost of Eliquis was $45  a month and last time he got it filled it was $137.62. Patient stated that he talked with his insurance company and was advised that it may be cheaper for him to be on Warfarin or Jantoven. Patient stated the insurance company could not tell him how much that would cost him. Patient wants to know what Dr. Silvio Pate thinks about an alternative or if he has any suggestions since the Eliquis is so expensive now.

## 2022-02-22 NOTE — Telephone Encounter (Signed)
Patient called and stated [place] medication and he don't know if it is expensive or not.Call back number [phone].

## 2022-02-22 NOTE — Telephone Encounter (Signed)
Spoke to patient by telephone and was advised that he is in the donut hole now. Patient is going to check with his insurance company and see if they cover a medication that his comparable to Eliquis that is lest expensive for him. Patient will call the office back with information.

## 2022-02-23 NOTE — Telephone Encounter (Signed)
Spoke to him and told him he should not switch to warfarin. He can afford the eliquis--just not happy about the price in the donut hole. He understands and will stick with it

## 2022-02-24 NOTE — Telephone Encounter (Signed)
I spoke to pt about Eliquis Pt Asst program for pts in the donut hole. He will gather all the information needed and bring it next week. I have a folder below my computer monitor that says waiting on pt. Form will be in there. He can either fill it out here or take it home and bring it back. Dr Silvio Pate will need to sign it as well.

## 2022-03-08 NOTE — Progress Notes (Signed)
Remote pacemaker transmission.   

## 2022-03-17 DIAGNOSIS — L814 Other melanin hyperpigmentation: Secondary | ICD-10-CM | POA: Diagnosis not present

## 2022-03-17 DIAGNOSIS — L570 Actinic keratosis: Secondary | ICD-10-CM | POA: Diagnosis not present

## 2022-03-17 DIAGNOSIS — Z85828 Personal history of other malignant neoplasm of skin: Secondary | ICD-10-CM | POA: Diagnosis not present

## 2022-03-17 DIAGNOSIS — L821 Other seborrheic keratosis: Secondary | ICD-10-CM | POA: Diagnosis not present

## 2022-03-17 DIAGNOSIS — L578 Other skin changes due to chronic exposure to nonionizing radiation: Secondary | ICD-10-CM | POA: Diagnosis not present

## 2022-03-27 DIAGNOSIS — H35373 Puckering of macula, bilateral: Secondary | ICD-10-CM | POA: Diagnosis not present

## 2022-03-27 DIAGNOSIS — H43813 Vitreous degeneration, bilateral: Secondary | ICD-10-CM | POA: Diagnosis not present

## 2022-03-27 DIAGNOSIS — H35033 Hypertensive retinopathy, bilateral: Secondary | ICD-10-CM | POA: Diagnosis not present

## 2022-03-27 DIAGNOSIS — H5010 Unspecified exotropia: Secondary | ICD-10-CM | POA: Diagnosis not present

## 2022-03-27 DIAGNOSIS — H524 Presbyopia: Secondary | ICD-10-CM | POA: Diagnosis not present

## 2022-03-27 DIAGNOSIS — H35353 Cystoid macular degeneration, bilateral: Secondary | ICD-10-CM | POA: Diagnosis not present

## 2022-04-24 ENCOUNTER — Encounter (INDEPENDENT_AMBULATORY_CARE_PROVIDER_SITE_OTHER): Payer: PPO | Admitting: Ophthalmology

## 2022-04-24 DIAGNOSIS — I10 Essential (primary) hypertension: Secondary | ICD-10-CM | POA: Diagnosis not present

## 2022-04-24 DIAGNOSIS — H338 Other retinal detachments: Secondary | ICD-10-CM

## 2022-04-24 DIAGNOSIS — H59031 Cystoid macular edema following cataract surgery, right eye: Secondary | ICD-10-CM | POA: Diagnosis not present

## 2022-04-24 DIAGNOSIS — H43813 Vitreous degeneration, bilateral: Secondary | ICD-10-CM | POA: Diagnosis not present

## 2022-04-24 DIAGNOSIS — H35033 Hypertensive retinopathy, bilateral: Secondary | ICD-10-CM

## 2022-04-24 DIAGNOSIS — H33103 Unspecified retinoschisis, bilateral: Secondary | ICD-10-CM

## 2022-04-24 DIAGNOSIS — H35373 Puckering of macula, bilateral: Secondary | ICD-10-CM

## 2022-05-08 ENCOUNTER — Ambulatory Visit (INDEPENDENT_AMBULATORY_CARE_PROVIDER_SITE_OTHER): Payer: PPO

## 2022-05-08 DIAGNOSIS — I495 Sick sinus syndrome: Secondary | ICD-10-CM

## 2022-05-09 LAB — CUP PACEART REMOTE DEVICE CHECK
Date Time Interrogation Session: 20231023101945
Implantable Lead Connection Status: 753985
Implantable Lead Connection Status: 753985
Implantable Lead Implant Date: 20230123
Implantable Lead Implant Date: 20230123
Implantable Lead Location: 753859
Implantable Lead Location: 753860
Implantable Lead Model: 377
Implantable Lead Model: 377
Implantable Lead Serial Number: 7000393970
Implantable Lead Serial Number: 8000680133
Implantable Pulse Generator Implant Date: 20230123
Pulse Gen Model: 407145
Pulse Gen Serial Number: 70294843

## 2022-05-20 ENCOUNTER — Other Ambulatory Visit: Payer: Self-pay | Admitting: Cardiovascular Disease

## 2022-05-22 ENCOUNTER — Other Ambulatory Visit: Payer: Self-pay | Admitting: Internal Medicine

## 2022-05-22 MED ORDER — APIXABAN 5 MG PO TABS: 5.0000 mg | ORAL_TABLET | Freq: Two times a day (BID) | ORAL | 11 refills | Status: DC

## 2022-05-22 NOTE — Telephone Encounter (Signed)
  Encourage patient to contact the pharmacy for refills or they can request refills through [facility]  Did the patient contact the pharmacy: No  LAST APPOINTMENT DATE: 10/18/2021  NEXT APPOINTMENT DATE: 10/24/2022  MEDICATION: apixaban (ELIQUIS) 5 MG TABS tablet  Is the patient out of medication? No  If not, how much is left? Couple days  Is this a 90 day supply: No, but patient would like this to be sent in as a 90 day supply.   PHARMACY: [id number] [place], [place] [address]  Let patient know to contact pharmacy at the end of the day to make sure medication is ready.  Please notify patient to allow 48-72 hours to process

## 2022-05-29 NOTE — Progress Notes (Signed)
Remote pacemaker transmission.   

## 2022-06-11 NOTE — Progress Notes (Signed)
Chief Complaint  Patient presents with   Follow-up    CAD   History of Present Illness: 81 yo Griffith with history of CAD, HTN, hyperlipidemia, GERD, atrial fibrillation and sick sinus syndrome s/p pacemaker implant in January 2023 who is here today for cardiac follow up. He is known to have mild to moderate CAD with last cath in March 2018 showing 40% ostial LAD stenosis and mild disease in the RCA and intermediate Tippetts. LV function was normal. He has been seen in the Pulmonary clinic for workup of dyspnea and is not felt to have significant lung disease. He has described mild chest pain with heavy exertion for years. He did not tolerate Imdur and has not been willing to try Ranexa. Echo 06/28/21 with LVEF=60-65%, no significant valve disease. He recently had a fall and was found to have a rib fracture on the left. Seen in primary care by Dr. Silvio Pate 07/19/20 and atrial fib noted. He was started on Eliquis. Cardiac monitor with sinus pause in January 2023. Given near syncope, pacemaker implanted January 2023.   He is here today for follow up. The patient denies any chest pain, dyspnea, palpitations, lower extremity edema, orthopnea, PND, dizziness, near syncope or syncope.   Primary Care Physician: Venia Carbon, MD  Past Medical History:  Diagnosis Date   Allergic rhinitis    Anginal pain (HCC)    CAD (coronary artery disease)    Chest pain    Duodenal ulcer    Dyspnea    On exertions   ED (erectile dysfunction)    GERD (gastroesophageal reflux disease)    History of colonoscopy    Hyperlipidemia    Hypertension    Osteoarthritis     Past Surgical History:  Procedure Laterality Date   APPENDECTOMY  1964   CARDIAC CATHETERIZATION  03/2010   diffuse early disease   CATARACT EXTRACTION Right 04/18/2016   INGUINAL HERNIA REPAIR Bilateral 1970   double hernia repair    LEFT HEART CATH AND CORONARY ANGIOGRAPHY N/A 10/12/2016   Procedure: Left Heart Cath and Coronary  Angiography;  Surgeon: Burnell Blanks, MD;  Location: [facility];  Service: Cardiovascular;  Laterality: N/A;   PACEMAKER IMPLANT N/A 08/08/2021   Procedure: PACEMAKER IMPLANT;  Surgeon: Evans Lance, MD;  Location: [facility];  Service: Cardiovascular;  Laterality: N/A;   RETINAL DETACHMENT SURGERY Right 2003   TONSILLECTOMY     TOTAL KNEE ARTHROPLASTY Right 09/10/2020   Procedure: RIGHT TOTAL KNEE ARTHROPLASTY;  Surgeon: Mcarthur Rossetti, MD;  Location: [facility];  Service: Orthopedics;  Laterality: Right;    Current Outpatient Medications  Medication Sig Dispense Refill   ALPHAGAN P 0.1 % SOLN Place 1 drop into the right eye in the morning.     apixaban (ELIQUIS) 5 MG TABS tablet Take 1 tablet (5 mg total) by mouth 2 (two) times daily. 60 tablet 11   Cholecalciferol (VITAMIN D-3) 125 MCG (5000 UT) TABS Take 5,000 Units by mouth at bedtime.     ILEVRO 0.3 % ophthalmic suspension Place 1 drop into the right eye at bedtime.     losartan (COZAAR) 50 MG tablet TAKE 1 TABLET BY MOUTH EVERY DAY 90 tablet 0   magnesium oxide (MAG-OX) 400 MG tablet Take 400 mg by mouth at bedtime.     Multiple Vitamins-Minerals (MULTIVITAMIN WITH MINERALS) tablet Take 1 tablet by mouth at bedtime.     nitroGLYCERIN (NITROSTAT) 0.4 MG SL tablet Place  1 tablet (0.4 mg total) under the tongue every 5 (five) minutes as needed for chest pain (x 3 doses). 25 tablet 3   omeprazole (PRILOSEC) 20 MG capsule Take 1 capsule (20 mg total) by mouth daily as needed. On an empty stomach 1 capsule 0   prednisoLONE acetate (PRED FORTE) 1 % ophthalmic suspension Place 1 drop into the right eye in the morning and at bedtime.     PROLENSA 0.07 % SOLN SMARTSIG:1 Drop(s) Right Eye Every Evening     simvastatin (ZOCOR) 40 MG tablet TAKE ONE TABLET BY MOUTH EACH EVENING 90 tablet 3   vitamin B-12 (CYANOCOBALAMIN) 500 MCG tablet Take 500 mcg by mouth in the morning.     No current facility-administered  medications for this visit.    Allergies  Allergen Reactions   Ibuprofen     REACTION: ulcer    Social History   Socioeconomic History   Marital status: Married    Spouse name: Not on file   Number of children: 2   Years of education: Not on file   Highest education level: Not on file  Occupational History   Occupation: Administrator, [employer]    Comment: retired  Tobacco Use   Smoking status: Former    Packs/day: 1.00    Years: 10.00    Total pack years: 10.00    Types: Cigars, Cigarettes    Quit date: 07/18/2007    Years since quitting: 14.9    Passive exposure: Past   Smokeless tobacco: Never   Tobacco comments:    Occ cigar - no cigarettes - stopped in 6-09  Vaping Use   Vaping Use: Never used  Substance and Sexual Activity   Alcohol use: Yes    Alcohol/week: 1.0 standard drink of alcohol    Types: 1 Standard drinks or equivalent per week    Comment: 3-4 drinks per week   Drug use: No   Sexual activity: Not on file  Other Topics Concern   Not on file  Social History Narrative   Has living will   Wife would serve as health care POA--then son   Would accept resuscitation attempts but no prolonged artificial ventilation    No tube feeds if cognitively unaware   Social Determinants of Health   Financial Resource Strain: Not on file  Food Insecurity: Not on file  Transportation Needs: Not on file  Physical Activity: Not on file  Stress: Not on file  Social Connections: Not on file  Intimate Partner Violence: Not on file    Family History  Problem Relation Age of Onset   Coronary artery disease Mother        CABG in 70's   Brain cancer Mother         that was metastatic  from lung    Stomach cancer Father 9   Diabetes Sister    Stroke Maternal Grandmother     Review of Systems:  As stated in the HPI and otherwise negative.   BP (!) 148/80   Pulse 60   Ht '5\' 5"'$  (1.651 m)   Wt 190 lb 3.2 oz (86.3 kg)   SpO2 Jonathan%   BMI 31.65 kg/m   Physical  Examination:  General: Well developed, well nourished, NAD  HEENT: OP clear, mucus membranes moist  SKIN: warm, dry. No rashes. Neuro: No focal deficits  Musculoskeletal: Muscle strength 5/5 all ext  Psychiatric: Mood and affect normal  Neck: No JVD, no carotid bruits, no thyromegaly, no lymphadenopathy.  Lungs:Clear bilaterally, no wheezes, rhonci, crackles Cardiovascular: Regular rate and rhythm. No murmurs, gallops or rubs. Abdomen:Soft. Bowel sounds present. Non-tender.  Extremities: No lower extremity edema. Pulses are 2 + in the bilateral DP/PT.  EKG:  EKG is ordered today. The ekg ordered today demonstrates V paced, atrial fib  Echo 06/28/21:  1. Left ventricular ejection fraction, by estimation, is 60 to 65%. The  left ventricle has normal function. The left ventricle has no regional  wall motion abnormalities. There is mild concentric left ventricular  hypertrophy. Left ventricular diastolic  parameters are indeterminate.   2. Right ventricular systolic function is normal. The right ventricular  size is normal. Tricuspid regurgitation signal is inadequate for assessing  PA pressure.   3. Left atrial size was mildly dilated.   4. The mitral valve is normal in structure. Trivial mitral valve  regurgitation.   5. The aortic valve is tricuspid. There is mild calcification of the  aortic valve. There is mild thickening of the aortic valve. Aortic valve  regurgitation is trivial. Aortic valve sclerosis/calcification is present,  without any evidence of aortic  stenosis.   6. Aortic dilatation noted. There is mild dilatation of the aortic root,  measuring 39 mm.   7. The inferior vena cava is normal in size with greater than 50%  respiratory variability, suggesting right atrial pressure of 3 mmHg.   Recent Labs: 10/18/2021: ALT 12; BUN 26; Creatinine, Ser 1.27; Hemoglobin 15.8; Platelets 223.0; Potassium 4.6; Sodium 139   Lipid Panel    Component Value Date/Time   CHOL  147 10/18/2021 0803   TRIG 175.0 (H) 10/18/2021 0803   HDL 49.30 10/18/2021 0803   CHOLHDL 3 10/18/2021 0803   VLDL 35.0 10/18/2021 0803   LDLCALC 63 10/18/2021 0803   LDLDIRECT 131.3 04/06/2009 0922     Wt Readings from Last 3 Encounters:  06/12/22 190 lb 3.2 oz (86.3 kg)  11/08/21 186 lb 6.4 oz (84.6 kg)  10/18/21 189 lb (85.7 kg)    Assessment and Plan:   1. CAD with stable angina:  He has chronic chest pain. No recent change in his baseline chest pains. His angina is felt to be due to microvascular disease. Cardiac cath in March 2018 with stable mild CAD. He never tried Ranexa. He did not tolerate Imdur. Continue statin. He is off of ASA since he is now on Eliquis.   2. HTN: BP is controlled  at home. No changes today  3. Hyperlipidemia: Lipids followed in primary care. LDL 63 in April 2023. Continue statin.   4. Persistent atrial fibrillation: Sinus today. Continue Eliquis.    5. Sick sinus syndrome: Pacemaker in place.   Labs/ tests ordered today include:   Orders Placed This Encounter  Procedures   EKG 12-Lead   Disposition:   F/U with me in 12 months.    Signed, Lauree Chandler, MD 06/12/2022 10:27 AM    [facility] [address], [place], [place]  [address] Phone: [phone]; Fax: [phone]

## 2022-06-12 ENCOUNTER — Encounter: Payer: Self-pay | Admitting: Cardiovascular Disease

## 2022-06-12 ENCOUNTER — Ambulatory Visit: Payer: PPO | Attending: Cardiovascular Disease | Admitting: Cardiovascular Disease

## 2022-06-12 VITALS — BP 148/80 | HR 60 | Ht 65.0 in | Wt 190.2 lb

## 2022-06-12 DIAGNOSIS — I4819 Other persistent atrial fibrillation: Secondary | ICD-10-CM

## 2022-06-12 DIAGNOSIS — I10 Essential (primary) hypertension: Secondary | ICD-10-CM

## 2022-06-12 DIAGNOSIS — I25119 Atherosclerotic heart disease of native coronary artery with unspecified angina pectoris: Secondary | ICD-10-CM | POA: Diagnosis not present

## 2022-06-12 DIAGNOSIS — I495 Sick sinus syndrome: Secondary | ICD-10-CM

## 2022-06-12 DIAGNOSIS — E7800 Pure hypercholesterolemia, unspecified: Secondary | ICD-10-CM

## 2022-06-12 NOTE — Patient Instructions (Signed)
Medication Instructions:  No changes *If you need a refill on your cardiac medications before your next appointment, please call your pharmacy*   Lab Work: none If you have labs (blood work) drawn today and your tests are completely normal, you will receive your results only by: [place] (if you have MyChart) OR A paper copy in the mail If you have any lab test that is abnormal or we need to change your treatment, we will call you to review the results.   Testing/Procedures: none   Follow-Up: At [facility], you and your health needs are our priority.  As part of our continuing mission to provide you with exceptional heart care, we have created designated Provider Care Teams.  These Care Teams include your primary Cardiologist (physician) and Advanced Practice Providers (APPs -  Physician Assistants and Nurse Practitioners) who all work together to provide you with the care you need, when you need it.  We recommend signing up for the patient portal called "MyChart".  Sign up information is provided on this After Visit Summary.  MyChart is used to connect with patients for Virtual Visits (Telemedicine).  Patients are able to view lab/test results, encounter notes, upcoming appointments, etc.  Non-urgent messages can be sent to your provider as well.   To learn more about what you can do with MyChart, go to [email or web address].    Your next appointment:   12 month(s)  The format for your next appointment:   In Person  Provider:   Lauree Chandler, MD      Important Information About Sugar

## 2022-08-07 ENCOUNTER — Ambulatory Visit: Payer: PPO | Attending: Internal Medicine

## 2022-08-07 DIAGNOSIS — I495 Sick sinus syndrome: Secondary | ICD-10-CM

## 2022-08-08 LAB — CUP PACEART REMOTE DEVICE CHECK
Battery Voltage: 95
Date Time Interrogation Session: 20240123105105
Implantable Lead Connection Status: 753985
Implantable Lead Connection Status: 753985
Implantable Lead Implant Date: 20230123
Implantable Lead Implant Date: 20230123
Implantable Lead Location: 753859
Implantable Lead Location: 753860
Implantable Lead Model: 377
Implantable Lead Model: 377
Implantable Lead Serial Number: 7000393970
Implantable Lead Serial Number: 8000680133
Implantable Pulse Generator Implant Date: 20230123
Pulse Gen Model: 407145
Pulse Gen Serial Number: 70294843

## 2022-09-21 NOTE — Progress Notes (Signed)
Remote pacemaker transmission.   

## 2022-09-25 ENCOUNTER — Encounter (INDEPENDENT_AMBULATORY_CARE_PROVIDER_SITE_OTHER): Payer: HMO | Admitting: Ophthalmology

## 2022-09-25 DIAGNOSIS — I10 Essential (primary) hypertension: Secondary | ICD-10-CM

## 2022-09-25 DIAGNOSIS — H59031 Cystoid macular edema following cataract surgery, right eye: Secondary | ICD-10-CM | POA: Diagnosis not present

## 2022-09-25 DIAGNOSIS — H35033 Hypertensive retinopathy, bilateral: Secondary | ICD-10-CM | POA: Diagnosis not present

## 2022-09-25 DIAGNOSIS — H43813 Vitreous degeneration, bilateral: Secondary | ICD-10-CM | POA: Diagnosis not present

## 2022-09-25 DIAGNOSIS — H35371 Puckering of macula, right eye: Secondary | ICD-10-CM | POA: Diagnosis not present

## 2022-09-25 DIAGNOSIS — H338 Other retinal detachments: Secondary | ICD-10-CM | POA: Diagnosis not present

## 2022-10-05 ENCOUNTER — Ambulatory Visit (INDEPENDENT_AMBULATORY_CARE_PROVIDER_SITE_OTHER): Payer: HMO | Admitting: Internal Medicine

## 2022-10-05 ENCOUNTER — Encounter: Payer: Self-pay | Admitting: Internal Medicine

## 2022-10-05 VITALS — BP 130/80 | HR 72 | Temp 97.6°F | Ht 65.0 in | Wt 192.0 lb

## 2022-10-05 DIAGNOSIS — N503 Cyst of epididymis: Secondary | ICD-10-CM | POA: Diagnosis not present

## 2022-10-05 NOTE — Progress Notes (Signed)
Subjective:    Patient ID: Jonathan Griffith, male    DOB: 01/13/1941, 82 y.o.   MRN: [id number]  HPI Here due to left testicle swelling  Noticed that his left testicle was larger than the right--when showering last night No pain No swelling in sac  No trauma or injury [facility]  Current Outpatient Medications on File Prior to Visit  Medication Sig Dispense Refill   ALPHAGAN P 0.1 % SOLN Place 1 drop into the right eye in the morning.     apixaban (ELIQUIS) 5 MG TABS tablet Take 1 tablet (5 mg total) by mouth 2 (two) times daily. 60 tablet 11   Cholecalciferol (VITAMIN D-3) 125 MCG (5000 UT) TABS Take 5,000 Units by mouth at bedtime.     ILEVRO 0.3 % ophthalmic suspension Place 1 drop into the right eye at bedtime.     losartan (COZAAR) 50 MG tablet TAKE 1 TABLET BY MOUTH EVERY DAY 90 tablet 0   magnesium oxide (MAG-OX) 400 MG tablet Take 400 mg by mouth at bedtime.     Multiple Vitamins-Minerals (MULTIVITAMIN WITH MINERALS) tablet Take 1 tablet by mouth at bedtime.     nitroGLYCERIN (NITROSTAT) 0.4 MG SL tablet Place 1 tablet (0.4 mg total) under the tongue every 5 (five) minutes as needed for chest pain (x 3 doses). 25 tablet 3   omeprazole (PRILOSEC) 20 MG capsule Take 1 capsule (20 mg total) by mouth daily as needed. On an empty stomach 1 capsule 0   prednisoLONE acetate (PRED FORTE) 1 % ophthalmic suspension Place 1 drop into the right eye in the morning and at bedtime.     PROLENSA 0.07 % SOLN SMARTSIG:1 Drop(s) Right Eye Every Evening     simvastatin (ZOCOR) 40 MG tablet TAKE ONE TABLET BY MOUTH EACH EVENING 90 tablet 3   vitamin B-12 (CYANOCOBALAMIN) 500 MCG tablet Take 500 mcg by mouth in the morning.     No current facility-administered medications on file prior to visit.    Allergies  Allergen Reactions   Ibuprofen     REACTION: ulcer    Past Medical History:  Diagnosis Date   Allergic rhinitis    Anginal pain (HCC)    CAD (coronary  artery disease)    Chest pain    Duodenal ulcer    Dyspnea    On exertions   ED (erectile dysfunction)    GERD (gastroesophageal reflux disease)    History of colonoscopy    Hyperlipidemia    Hypertension    Osteoarthritis     Past Surgical History:  Procedure Laterality Date   APPENDECTOMY  1964   CARDIAC CATHETERIZATION  03/2010   diffuse early disease   CATARACT EXTRACTION Right 04/18/2016   INGUINAL HERNIA REPAIR Bilateral 1970   double hernia repair    LEFT HEART CATH AND CORONARY ANGIOGRAPHY N/A 10/12/2016   Procedure: Left Heart Cath and Coronary Angiography;  Surgeon: Burnell Blanks, MD;  Location: [facility];  Service: Cardiovascular;  Laterality: N/A;   PACEMAKER IMPLANT N/A 08/08/2021   Procedure: PACEMAKER IMPLANT;  Surgeon: Evans Lance, MD;  Location: [facility];  Service: Cardiovascular;  Laterality: N/A;   RETINAL DETACHMENT SURGERY Right 2003   TONSILLECTOMY     TOTAL KNEE ARTHROPLASTY Right 09/10/2020   Procedure: RIGHT TOTAL KNEE ARTHROPLASTY;  Surgeon: Mcarthur Rossetti, MD;  Location: [facility];  Service: Orthopedics;  Laterality: Right;    Family History  Problem Relation Age of Onset   Coronary artery disease Mother        CABG in 27's   Brain cancer Mother         that was metastatic  from lung    Stomach cancer Father 25   Diabetes Sister    Stroke Maternal Grandmother     Social History   Socioeconomic History   Marital status: Married    Spouse name: Not on file   Number of children: 2   Years of education: Not on file   Highest education level: Not on file  Occupational History   Occupation: Administrator, [employer]    Comment: retired  Tobacco Use   Smoking status: Former    Packs/day: 1.00    Years: 10.00    Additional pack years: 0.00    Total pack years: 10.00    Types: Cigars, Cigarettes    Quit date: 07/18/2007    Years since quitting: 15.2    Passive exposure: Past   Smokeless tobacco: Never   Tobacco  comments:    Occ cigar - no cigarettes - stopped in 6-09  Vaping Use   Vaping Use: Never used  Substance and Sexual Activity   Alcohol use: Yes    Alcohol/week: 1.0 standard drink of alcohol    Types: 1 Standard drinks or equivalent per week    Comment: 3-4 drinks per week   Drug use: No   Sexual activity: Not on file  Other Topics Concern   Not on file  Social History Narrative   Has living will   Wife would serve as health care POA--then son   Would accept resuscitation attempts but no prolonged artificial ventilation    No tube feeds if cognitively unaware   Social Determinants of Health   Financial Resource Strain: Not on file  Food Insecurity: Not on file  Transportation Needs: Not on file  Physical Activity: Not on file  Stress: Not on file  Social Connections: Not on file  Intimate Partner Violence: Not on file   Review of Systems No fever Voids okay    Objective:   Physical Exam Constitutional:      Appearance: Normal appearance.  Genitourinary:    Comments: Testes appear to be similar sized and no masses Has epididymal cyst on left ----slightly tender Neurological:     Mental Status: He is alert.            Assessment & Plan:

## 2022-10-05 NOTE — Assessment & Plan Note (Signed)
Reassured that testes appear normal Generally, epididymal cysts are not a concern He does have a physical next month--will recheck then If grows, or becomes painful---will refer to urology

## 2022-10-13 ENCOUNTER — Other Ambulatory Visit: Payer: Self-pay | Admitting: Cardiovascular Disease

## 2022-10-21 IMAGING — DX DG CHEST 2V
2 series · 2 of 2 positions shown · non-contrast
Comparison: 05/07/2020

CLINICAL DATA: Pacemaker

EXAM:
CHEST - 2 VIEW

[w chest pa]
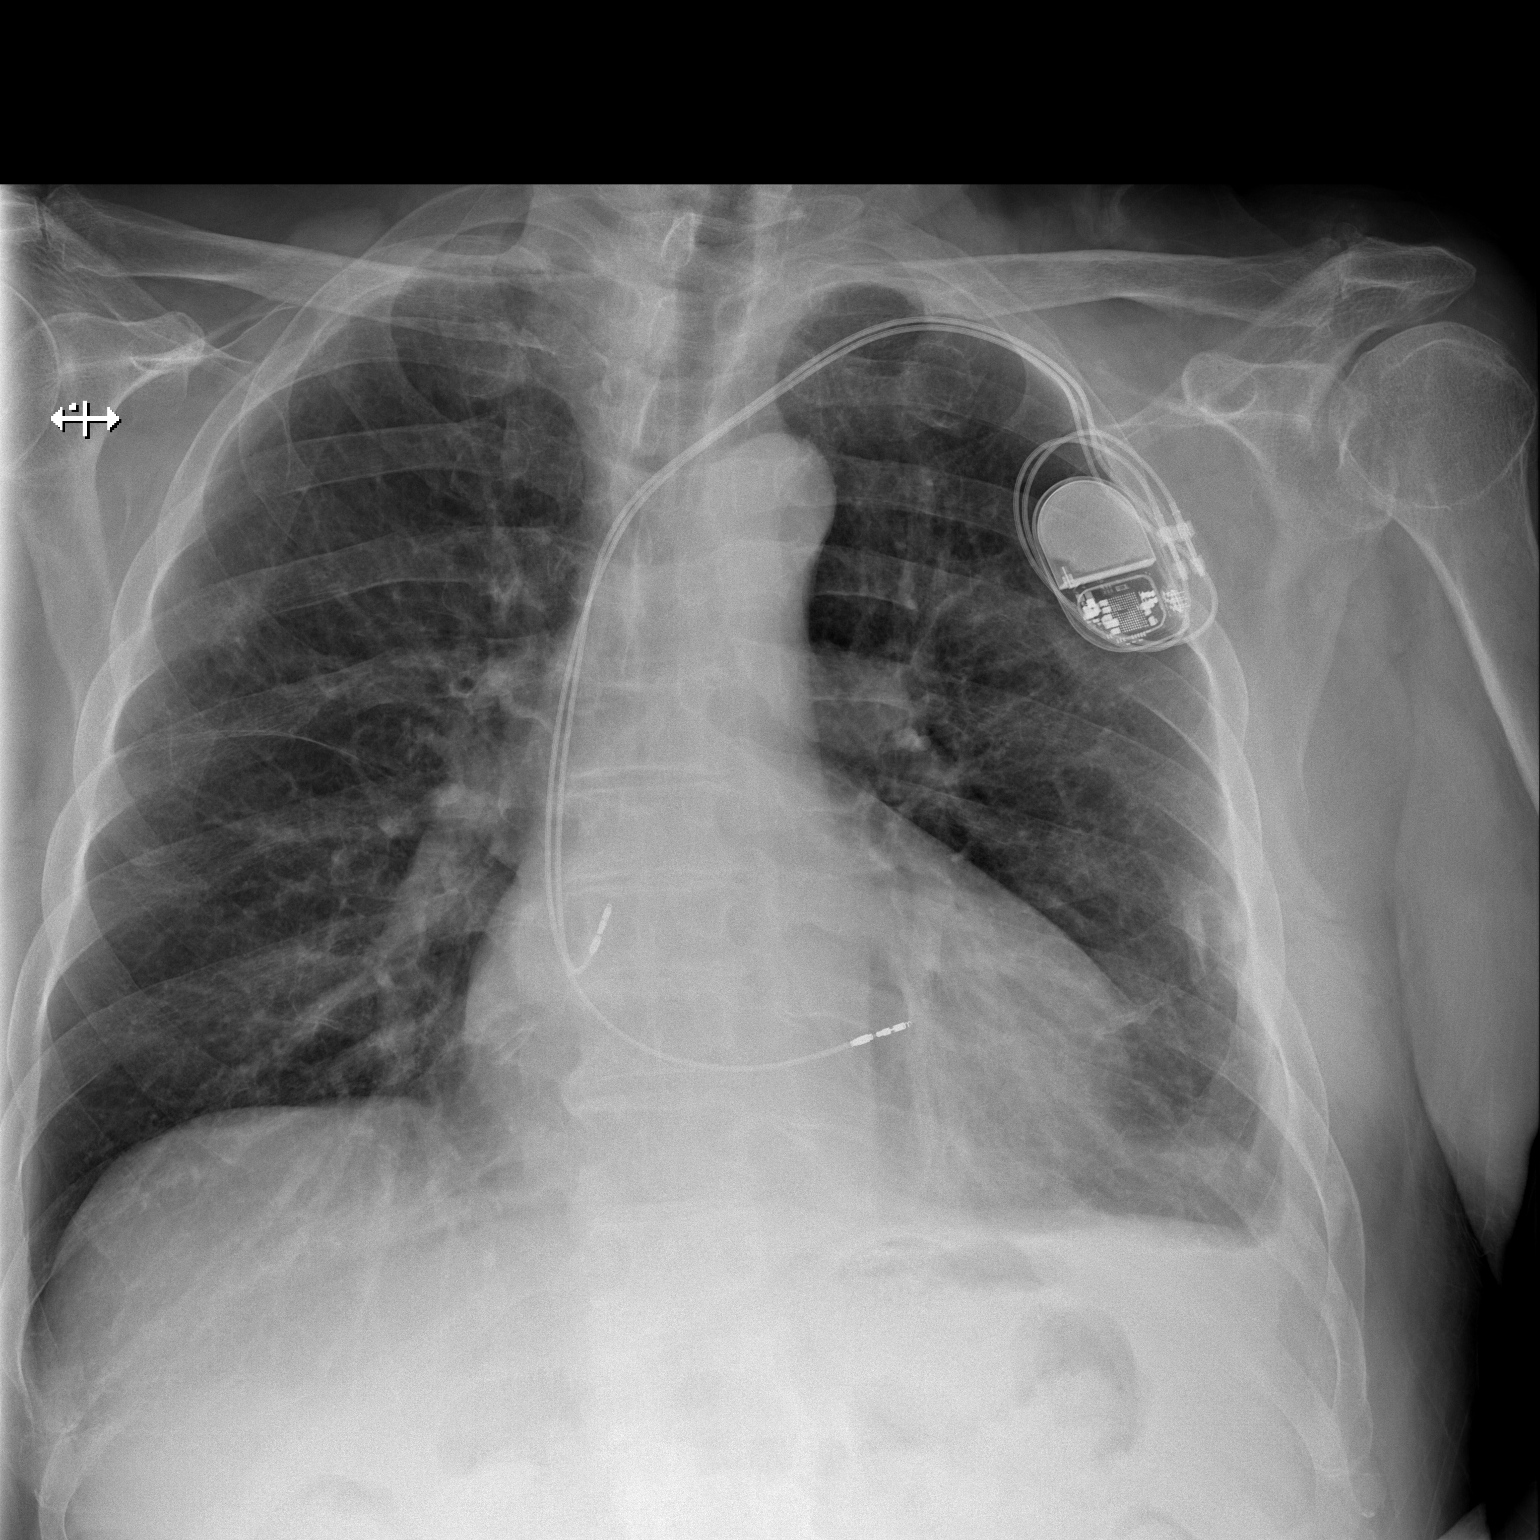

[w chest lat]
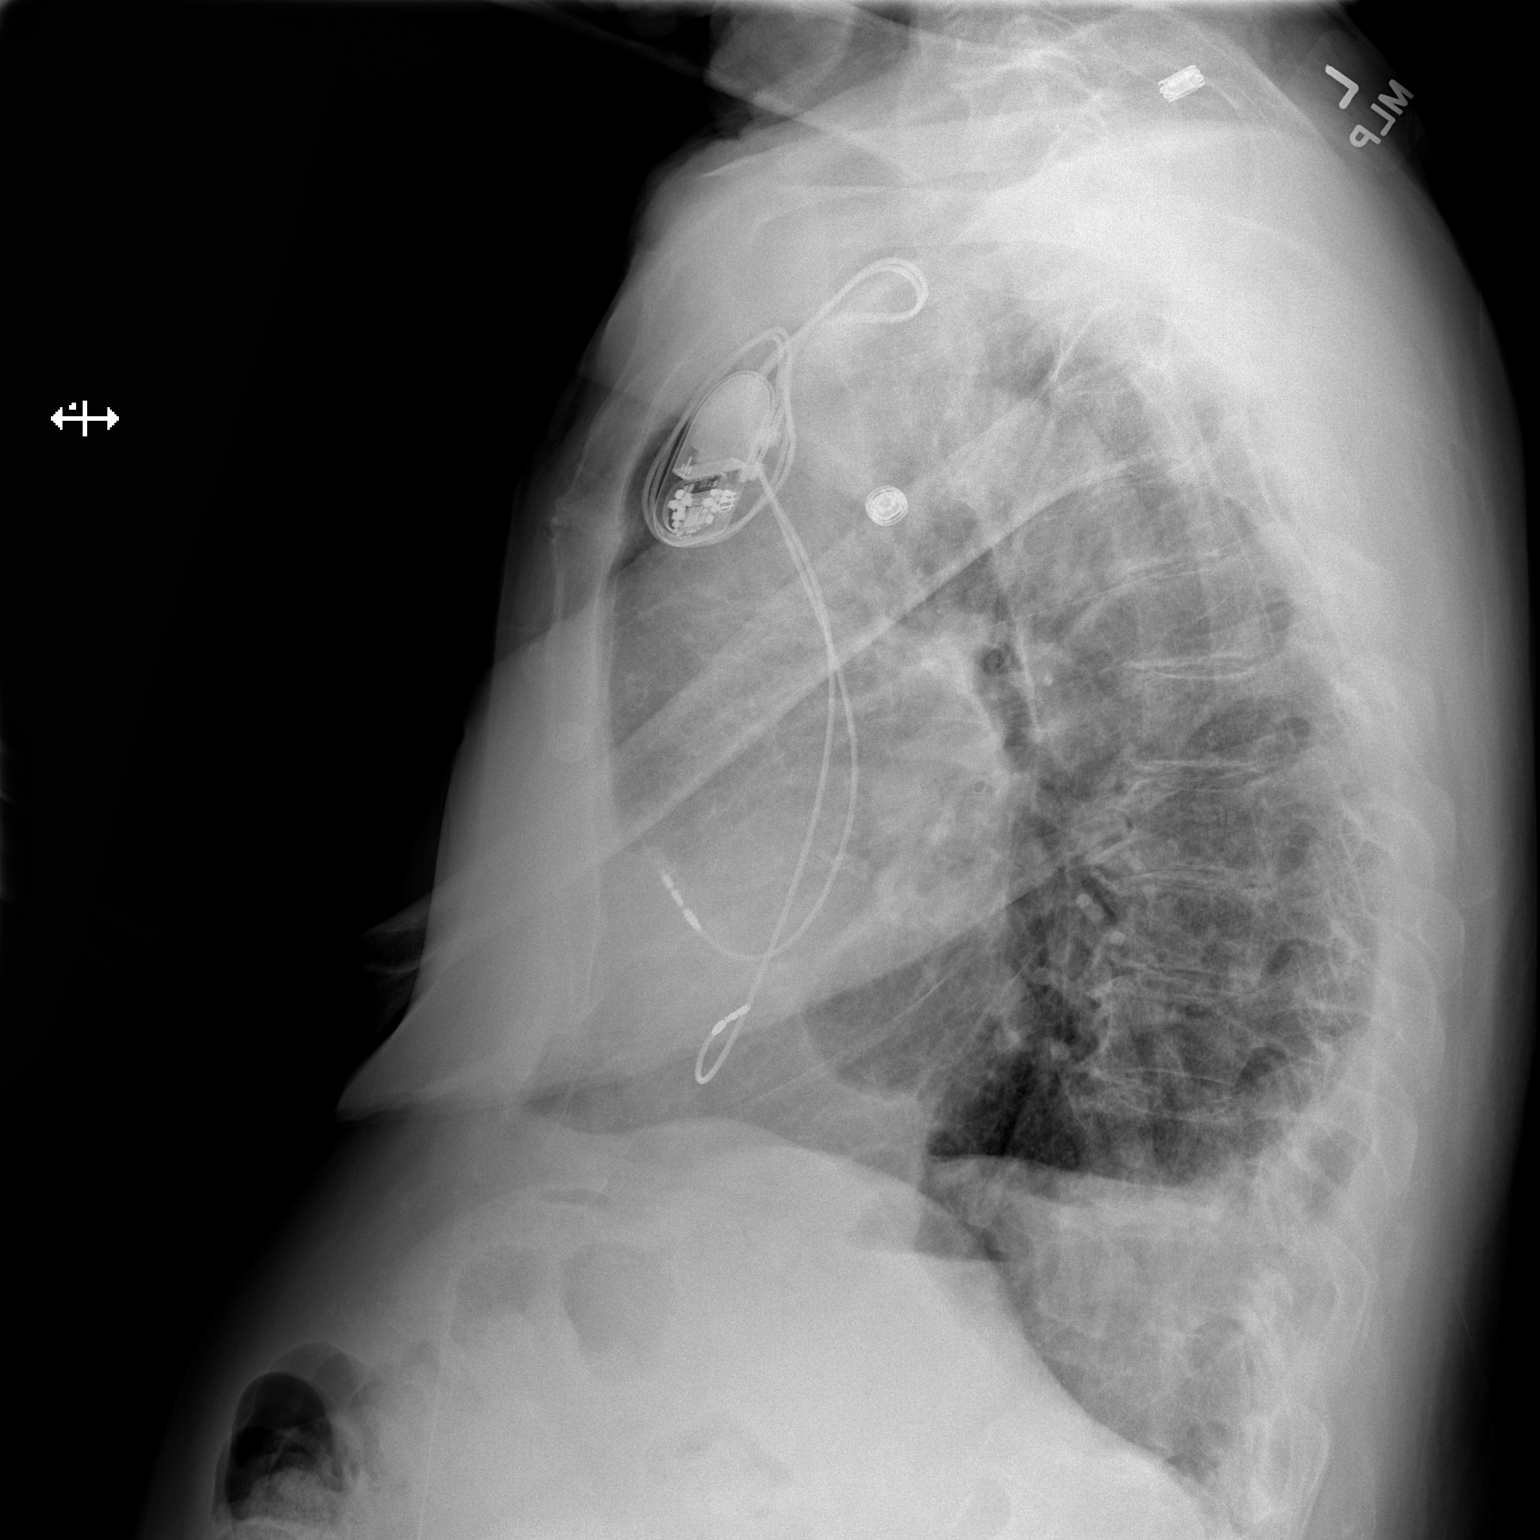

[2 of 2 positions shown; findings below may reference images not displayed]

FINDINGS: Interim insertion of left-sided pacing device with leads over right
atrium and right ventricle. No pneumothorax. Linear scarring or
atelectasis left base. Small left effusion.
IMPRESSION: 1. Interim placement of left-sided pacing device without visible
left pneumothorax
2. Small left effusion with atelectasis or scarring in the left
lower lung.

## 2022-10-24 ENCOUNTER — Ambulatory Visit (INDEPENDENT_AMBULATORY_CARE_PROVIDER_SITE_OTHER): Payer: HMO | Admitting: Internal Medicine

## 2022-10-24 ENCOUNTER — Encounter: Payer: Self-pay | Admitting: Internal Medicine

## 2022-10-24 VITALS — BP 136/80 | HR 60 | Temp 97.4°F | Ht 65.75 in | Wt 189.0 lb

## 2022-10-24 DIAGNOSIS — G479 Sleep disorder, unspecified: Secondary | ICD-10-CM

## 2022-10-24 DIAGNOSIS — I480 Paroxysmal atrial fibrillation: Secondary | ICD-10-CM | POA: Diagnosis not present

## 2022-10-24 DIAGNOSIS — N1831 Chronic kidney disease, stage 3a: Secondary | ICD-10-CM | POA: Diagnosis not present

## 2022-10-24 DIAGNOSIS — K219 Gastro-esophageal reflux disease without esophagitis: Secondary | ICD-10-CM

## 2022-10-24 DIAGNOSIS — E785 Hyperlipidemia, unspecified: Secondary | ICD-10-CM | POA: Diagnosis not present

## 2022-10-24 DIAGNOSIS — I25119 Atherosclerotic heart disease of native coronary artery with unspecified angina pectoris: Secondary | ICD-10-CM | POA: Diagnosis not present

## 2022-10-24 DIAGNOSIS — Z0000 Encounter for general adult medical examination without abnormal findings: Secondary | ICD-10-CM | POA: Diagnosis not present

## 2022-10-24 LAB — CBC
HCT: 49.1 % (ref 39.0–52.0)
Hemoglobin: 16.7 g/dL (ref 13.0–17.0)
MCHC: 34 g/dL (ref 30.0–36.0)
MCV: 90.7 fl (ref 78.0–100.0)
Platelets: 228 10*3/uL (ref 150.0–400.0)
RBC: 5.42 Mil/uL (ref 4.22–5.81)
RDW: 14.3 % (ref 11.5–15.5)
WBC: 7.8 10*3/uL (ref 4.0–10.5)

## 2022-10-24 LAB — COMPREHENSIVE METABOLIC PANEL
ALT: 13 U/L (ref 0–53)
AST: 20 U/L (ref 0–37)
Albumin: 4.3 g/dL (ref 3.5–5.2)
Alkaline Phosphatase: 88 U/L (ref 39–117)
BUN: 22 mg/dL (ref 6–23)
CO2: 26 mEq/L (ref 19–32)
Calcium: 9.6 mg/dL (ref 8.4–10.5)
Chloride: 102 mEq/L (ref 96–112)
Creatinine, Ser: 1.27 mg/dL (ref 0.40–1.50)
GFR: 52.36 mL/min — ABNORMAL LOW (ref >60.00)
Glucose, Bld: 101 mg/dL — ABNORMAL HIGH (ref 70–99)
Potassium: 4.5 mEq/L (ref 3.5–5.1)
Sodium: 139 mEq/L (ref 135–145)
Total Bilirubin: 1 mg/dL (ref 0.2–1.2)
Total Protein: 7 g/dL (ref 6.0–8.3)

## 2022-10-24 LAB — LIPID PANEL
Cholesterol: 129 mg/dL (ref 0–200)
HDL: 46.7 mg/dL (ref >39.00)
LDL Cholesterol: 51 mg/dL (ref 0–99)
NonHDL: 82.27
Total CHOL/HDL Ratio: 3
Triglycerides: 154 mg/dL — ABNORMAL HIGH (ref 0.0–149.0)
VLDL: 30.8 mg/dL (ref 0.0–40.0)

## 2022-10-24 MED ORDER — NITROGLYCERIN 0.4 MG SL SUBL: 0.4000 mg | SUBLINGUAL_TABLET | SUBLINGUAL | 3 refills | Status: AC | PRN

## 2022-10-24 NOTE — Progress Notes (Signed)
Hearing Screening - Comments:: Passed whisper test Vision Screening - Comments:: March 2024  

## 2022-10-24 NOTE — Assessment & Plan Note (Signed)
Paced No palpitaitons Is on the eliquis 5 bid

## 2022-10-24 NOTE — Assessment & Plan Note (Signed)
No problems with statin 

## 2022-10-24 NOTE — Assessment & Plan Note (Signed)
Chronic but used to it No meds

## 2022-10-24 NOTE — Assessment & Plan Note (Signed)
Slightly low GFR Is on losartan Will recheck

## 2022-10-24 NOTE — Assessment & Plan Note (Signed)
Better Only uses the omeprazole once in a while

## 2022-10-24 NOTE — Assessment & Plan Note (Signed)
I have personally reviewed the Medicare Annual Wellness questionnaire and have noted 1. The patient's medical and social history 2. Their use of alcohol, tobacco or illicit drugs 3. Their current medications and supplements 4. The patient's functional ability including ADL's, fall risks, home safety risks and hearing or visual             impairment. 5. Diet and physical activities 6. Evidence for depression or mood disorders  The patients weight, height, BMI and visual acuity have been recorded in the chart I have made referrals, counseling and provided education to the patient based review of the above and I have provided the pt with a written personalized care plan for preventive services.  I have provided you with a copy of your personalized plan for preventive services. Please take the time to review along with your updated medication list.  Healthy Stays fit No cancer screening due to age Prefers no more COVID vaccines Flu/RSV vaccines in the fall Will consider prevnar 20

## 2022-10-24 NOTE — Progress Notes (Addendum)
Subjective:    Patient ID: Jonathan Griffith, male    DOB: March 05, 1941, 82 y.o.   MRN: [id number]  HPI Here for Medicare wellness visit and follow up of chronic health conditions Reviewed advanced directives Reviewed other doctors--Dr McAlhany--cardiologist, Dr Ladona Ridgel, EP cardiology, Dr Lanny Cramp, Dr Madaline Brilliant, Dr Marlou Porch, Ms Haverstock--derm No hospitalizations or surgery in the past year Vision is fine--but just in right eye Hearing is good Enjoys whiskey daily No tobacco Regular exercise No falls No depression or anhedonia Independent with instrumental ADLs No sig memory problems  Does notice some "leg getting wobbly" when he first gets up Has to sit on the side of the bed for a while---swimmy headed Does check BP at home--runs fine No change in exercise tolerance--goes to gym regularly (bike, weights) Still gets slight chest pain--like walking up hill Also will gets brief DOE walking around house--not really in the yard No palpitations No edema  No heartburn --only occasionally needs the omeprazole No dysphagia  Still has hand pain---worst around MCPs Uses the diclofenac gel--and it helps Some tylenol also  Last GFR slightly low at 52  Current Outpatient Medications on File Prior to Visit  Medication Sig Dispense Refill   ALPHAGAN P 0.1 % SOLN Place 1 drop into the right eye in the morning.     apixaban (ELIQUIS) 5 MG TABS tablet Take 1 tablet (5 mg total) by mouth 2 (two) times daily. 60 tablet 11   Cholecalciferol (VITAMIN D-3) 125 MCG (5000 UT) TABS Take 5,000 Units by mouth at bedtime.     ILEVRO 0.3 % ophthalmic suspension Place 1 drop into the right eye at bedtime.     losartan (COZAAR) 50 MG tablet TAKE 1 TABLET BY MOUTH EVERY DAY 90 tablet 0   magnesium oxide (MAG-OX) 400 MG tablet Take 400 mg by mouth at bedtime.     Multiple Vitamins-Minerals (MULTIVITAMIN WITH MINERALS) tablet Take 1 tablet by mouth at bedtime.     nitroGLYCERIN  (NITROSTAT) 0.4 MG SL tablet Place 1 tablet (0.4 mg total) under the tongue every 5 (five) minutes as needed for chest pain (x 3 doses). 25 tablet 3   omeprazole (PRILOSEC) 20 MG capsule Take 1 capsule (20 mg total) by mouth daily as needed. On an empty stomach 1 capsule 0   prednisoLONE acetate (PRED FORTE) 1 % ophthalmic suspension Place 1 drop into the right eye in the morning and at bedtime.     PROLENSA 0.07 % SOLN SMARTSIG:1 Drop(s) Right Eye Every Evening     simvastatin (ZOCOR) 40 MG tablet TAKE ONE TABLET BY MOUTH EACH EVENING 90 tablet 3   vitamin B-12 (CYANOCOBALAMIN) 500 MCG tablet Take 500 mcg by mouth in the morning.     No current facility-administered medications on file prior to visit.    Allergies  Allergen Reactions   Ibuprofen     REACTION: ulcer    Past Medical History:  Diagnosis Date   Allergic rhinitis    Anginal pain    CAD (coronary artery disease)    Chest pain    Duodenal ulcer    Dyspnea    On exertions   ED (erectile dysfunction)    GERD (gastroesophageal reflux disease)    History of colonoscopy    Hyperlipidemia    Hypertension    Osteoarthritis     Past Surgical History:  Procedure Laterality Date   APPENDECTOMY  1964   CARDIAC CATHETERIZATION  03/2010   diffuse early disease   CATARACT EXTRACTION  Right 04/18/2016   INGUINAL HERNIA REPAIR Bilateral 1970   double hernia repair    LEFT HEART CATH AND CORONARY ANGIOGRAPHY N/A 10/12/2016   Procedure: Left Heart Cath and Coronary Angiography;  Surgeon: Kathleene Hazelhristopher D McAlhany, MD;  [facility];  Service: Cardiovascular;  Laterality: N/A;   PACEMAKER IMPLANT N/A 08/08/2021   Procedure: PACEMAKER IMPLANT;  Surgeon: Marinus Mawaylor, Gregg W, MD;  Location: [facility] INVASIVE CV LAB;  Service: Cardiovascular;  Laterality: N/A;   RETINAL DETACHMENT SURGERY Right 2003   TONSILLECTOMY     TOTAL KNEE ARTHROPLASTY Right 09/10/2020   Procedure: RIGHT TOTAL KNEE ARTHROPLASTY;  Surgeon: Kathryne HitchBlackman,  Christopher Y, MD;  Location: [facility];  Service: Orthopedics;  Laterality: Right;    Family History  Problem Relation Age of Onset   Coronary artery disease Mother        CABG in 2860's   Brain cancer Mother         that was metastatic  from lung    Stomach cancer Father [id number]   Diabetes Sister    Stroke Maternal Grandmother     Social History   Socioeconomic History   Marital status: Married    Spouse name: Not on file   Number of children: 2   Years of education: Not on file   Highest education level: Not on file  Occupational History   Occupation: Haematologistteel worker    Comment: retired  Tobacco Use   Smoking status: Former    Packs/day: 1.00    Years: 10.00    Additional pack years: 0.00    Total pack years: 10.00    Types: Cigars, Cigarettes    Quit date: 07/18/2007    Years since quitting: 15.2    Passive exposure: Past   Smokeless tobacco: Never   Tobacco comments:    Occ cigar - no cigarettes - stopped in 6-09  Vaping Use   Vaping Use: Never used  Substance and Sexual Activity   Alcohol use: Yes    Alcohol/week: 1.0 standard drink of alcohol    Types: 1 Standard drinks or equivalent per week    Comment: 3-4 drinks per week   Drug use: No   Sexual activity: Not on file  Other Topics Concern   Not on file  Social History Narrative   Has living will   Wife would serve as health care POA--then son   Would accept resuscitation attempts but no prolonged artificial ventilation    No tube feeds if cognitively unaware   Social Determinants of Health   Financial Resource Strain: Not on file  Food Insecurity: Not on file  Transportation Needs: Not on file  Physical Activity: Not on file  Stress: Not on file  Social Connections: Not on file  Intimate Partner Violence: Not on file   Review of Systems Appetite is variable---not much at times Weight is stable Sleep is still not great---will nap in day (refreshes him) Wears seat belt Dentures---no problems No  suspicious skin lesions Bowels move fine Voids with good stream. Nocturia x 2. Empties okay    Objective:   Physical Exam Constitutional:      Appearance: Normal appearance.  HENT:     Mouth/Throat:     Pharynx: No oropharyngeal exudate or posterior oropharyngeal erythema.  Eyes:     Conjunctiva/sclera: Conjunctivae normal.     Comments: Left exophoria  Cardiovascular:     Rate and Rhythm: Normal rate and regular rhythm.     Heart sounds:  No gallop.     Comments: Normal pulse right foot, faint on right Pulmonary:     Effort: Pulmonary effort is normal.     Breath sounds: Normal breath sounds. No wheezing or rales.  Abdominal:     Palpations: Abdomen is soft.     Tenderness: There is no abdominal tenderness.  Musculoskeletal:     Cervical back: Neck supple.     Right lower leg: No edema.     Left lower leg: No edema.  Lymphadenopathy:     Cervical: No cervical adenopathy.  Skin:    Findings: No lesion or rash.  Neurological:     General: No focal deficit present.     Mental Status: He is alert and oriented to person, place, and time.     Comments: Word naming--- 5 quick then froze Recall 3/3  Psychiatric:        Mood and Affect: Mood normal.        Behavior: Behavior normal.            Assessment & Plan:

## 2022-10-24 NOTE — Assessment & Plan Note (Signed)
Stable angina pattern over many years Slight orthostatic dizziness as well On losartan 50 and simvastatin Hasn't needed nitro

## 2022-11-06 ENCOUNTER — Ambulatory Visit (INDEPENDENT_AMBULATORY_CARE_PROVIDER_SITE_OTHER): Payer: HMO

## 2022-11-06 DIAGNOSIS — I495 Sick sinus syndrome: Secondary | ICD-10-CM

## 2022-11-06 LAB — CUP PACEART REMOTE DEVICE CHECK
Battery Remaining Percentage: 90 %
Brady Statistic RV Percent Paced: 73 %
Date Time Interrogation Session: 20240422130646
Implantable Lead Connection Status: 753985
Implantable Lead Connection Status: 753985
Implantable Lead Implant Date: 20230123
Implantable Lead Implant Date: 20230123
Implantable Lead Location: 753859
Implantable Lead Location: 753860
Implantable Lead Model: 377
Implantable Lead Model: 377
Implantable Lead Serial Number: 7000393970
Implantable Lead Serial Number: 8000680133
Implantable Pulse Generator Implant Date: 20230123
Lead Channel Impedance Value: 585 Ohm
Lead Channel Pacing Threshold Amplitude: 0.8 V
Lead Channel Pacing Threshold Pulse Width: 0.4 ms
Lead Channel Sensing Intrinsic Amplitude: 19.2 mV
Lead Channel Setting Pacing Amplitude: 2.6 V
Lead Channel Setting Pacing Pulse Width: 0.4 ms
Pulse Gen Model: 407145
Pulse Gen Serial Number: 70294843

## 2022-11-08 NOTE — Progress Notes (Unsigned)
Cardiology Office Note Date:  11/09/2022  Patient ID:  Jonathan Griffith, Jonathan Griffith [birth year], MRN [id number] PCP:  Karie Schwalbe, MD  Cardiologist:  Dr. Clifton Emillio Electrophysiologist: Dr. Ladona Ridgel    Chief Complaint:  annual visit  History of Present Illness: Jonathan Griffith is a 82 y.o. male with history of AFib, tachy-brady w/PPM, HTN, HLD, mod nonobstructive CAD by cath 2018  Saw Dr. Ladona Ridgel 11/08/21, doing well, no syncope since PPM , AFib controlled, no changes made  Saw Dr. Clifton Geofrey 06/12/22, again, doing well, reported no symptoms, no changes made.  TODAY He is doing well but mentions gets winded with increased activity. Walking with a s light incline or faster paced, will get a little winded. But also going to the gym 3d/week, bike, some resistance exercises as well No CP Mentions a momentary but repetitive poking type pain to his L axilla, non radiating, random, not exertional, lasts a second when it happens No rest SOB No near syncope or syncope No bleeding or signs of bleeding No palpitations or cardiac awareness of any kind   Device information Biotronik dual chamber PPM implanted 08/08/21 RV septal position  Past Medical History:  Diagnosis Date   Allergic rhinitis    Anginal pain    CAD (coronary artery disease)    Chest pain    Duodenal ulcer    Dyspnea    On exertions   ED (erectile dysfunction)    GERD (gastroesophageal reflux disease)    History of colonoscopy    Hyperlipidemia    Hypertension    Osteoarthritis     Past Surgical History:  Procedure Laterality Date   APPENDECTOMY  1964   CARDIAC CATHETERIZATION  03/2010   diffuse early disease   CATARACT EXTRACTION Right 04/18/2016   INGUINAL HERNIA REPAIR Bilateral 1970   double hernia repair    LEFT HEART CATH AND CORONARY ANGIOGRAPHY N/A 10/12/2016   Procedure: Left Heart Cath and Coronary Angiography;  Surgeon: Kathleene Hazel, MD;  [facility] INVASIVE CV LAB;  Service: Cardiovascular;   Laterality: N/A;   PACEMAKER IMPLANT N/A 08/08/2021   Procedure: PACEMAKER IMPLANT;  Surgeon: Marinus Maw, MD;  Location: [facility] INVASIVE CV LAB;  Service: Cardiovascular;  Laterality: N/A;   RETINAL DETACHMENT SURGERY Right 2003   TONSILLECTOMY     TOTAL KNEE ARTHROPLASTY Right 09/10/2020   Procedure: RIGHT TOTAL KNEE ARTHROPLASTY;  Surgeon: Kathryne Hitch, MD;  Location: [facility];  Service: Orthopedics;  Laterality: Right;    Current Outpatient Medications  Medication Sig Dispense Refill   ALPHAGAN P 0.1 % SOLN Place 1 drop into the right eye in the morning.     apixaban (ELIQUIS) 5 MG TABS tablet Take 1 tablet (5 mg total) by mouth 2 (two) times daily. 60 tablet 11   Cholecalciferol (VITAMIN D-3) 125 MCG (5000 UT) TABS Take 5,000 Units by mouth at bedtime.     ILEVRO 0.3 % ophthalmic suspension Place 1 drop into the right eye at bedtime.     losartan (COZAAR) 50 MG tablet TAKE 1 TABLET BY MOUTH EVERY DAY 90 tablet 0   magnesium oxide (MAG-OX) 400 MG tablet Take 400 mg by mouth at bedtime.     Multiple Vitamins-Minerals (MULTIVITAMIN WITH MINERALS) tablet Take 1 tablet by mouth at bedtime.     nitroGLYCERIN (NITROSTAT) 0.4 MG SL tablet Place 1 tablet (0.4 mg total) under the tongue every 5 (five) minutes as needed for chest pain (x 3 doses). 25 tablet 3  omeprazole (PRILOSEC) 20 MG capsule Take 1 capsule (20 mg total) by mouth daily as needed. On an empty stomach 1 capsule 0   prednisoLONE acetate (PRED FORTE) 1 % ophthalmic suspension Place 1 drop into the right eye in the morning and at bedtime.     PROLENSA 0.07 % SOLN SMARTSIG:1 Drop(s) Right Eye Every Evening     simvastatin (ZOCOR) 40 MG tablet TAKE ONE TABLET BY MOUTH EACH EVENING 90 tablet 3   vitamin B-12 (CYANOCOBALAMIN) 500 MCG tablet Take 500 mcg by mouth in the morning.     No current facility-administered medications for this visit.    Allergies:   Ibuprofen   Social History:  The patient  reports that he quit  smoking about 15 years ago. His smoking use included cigars and cigarettes. He has a 10.00 pack-year smoking history. He has been exposed to tobacco smoke. He has never used smokeless tobacco. He reports current alcohol use of about 1.0 standard drink of alcohol per week. He reports that he does not use drugs.   Family History:  The patient's family history includes Brain cancer in his mother; Coronary artery disease in his mother; Diabetes in his sister; Stomach cancer (age of onset: 80) in his father; Stroke in his maternal grandmother.  ROS:  Please see the history of present illness.    All other systems are reviewed and otherwise negative.   PHYSICAL EXAM:  VS:  BP 124/82   Pulse 65   Ht 5' 5.75" (1.67 m)   Wt 190 lb 9.6 oz (86.5 kg)   SpO2 98%   BMI 31.00 kg/m  BMI: Body mass index is 31 kg/m. Well nourished, well developed, in no acute distress HEENT: normocephalic, atraumatic Neck: no JVD, carotid bruits or masses Cardiac:  RRR (paced); no significant murmurs, no rubs, or gallops Lungs:  CTA b/l, no wheezing, rhonchi or rales Abd: soft, nontender MS: no deformity or  atrophy Ext: no edema Skin: warm and dry, no rash Neuro:  No gross deficits appreciated Psych: euthymic mood, full affect   PPM site is stable, no tethering or discomfort   EKG:  Done today and reviewed by myself shows  AFib 65bpm/pacing/fusing some beats, QRS  Device interrogation done today and reviewed by myself:  Battery and lead measurements are good VVI 609 Histograms pretty stuck 60's and 72% VP  CLS turned on 60/120, medium (+20)   Echo 06/28/21:  1. Left ventricular ejection fraction, by estimation, is 60 to 65%. The  left ventricle has normal function. The left ventricle has no regional  wall motion abnormalities. There is mild concentric left ventricular  hypertrophy. Left ventricular diastolic  parameters are indeterminate.   2. Right ventricular systolic function is normal. The  right ventricular  size is normal. Tricuspid regurgitation signal is inadequate for assessing  PA pressure.   3. Left atrial size was mildly dilated.   4. The mitral valve is normal in structure. Trivial mitral valve  regurgitation.   5. The aortic valve is tricuspid. There is mild calcification of the  aortic valve. There is mild thickening of the aortic valve. Aortic valve  regurgitation is trivial. Aortic valve sclerosis/calcification is present,  without any evidence of aortic  stenosis.   6. Aortic dilatation noted. There is mild dilatation of the aortic root,  measuring 39 mm.   7. The inferior vena cava is normal in size with greater than 50%  respiratory variability, suggesting right atrial pressure of 3 mmHg.  10/12/2016: LHC Ost RCA to Mid RCA lesion, 10 %stenosed. Ost Ramus to Ramus lesion, 20 %stenosed. Ost LAD to Prox LAD lesion, 40 %stenosed. Dist LAD lesion, 30 %stenosed. The left ventricular systolic function is normal. LV end diastolic pressure is normal. The left ventricular ejection fraction is greater than 65% by visual estimate. There is no mitral valve regurgitation.   1. Stable mild to moderate CAD 2. Normal LV function    Recent Labs: 10/24/2022: ALT 13; BUN 22; Creatinine, Ser 1.27; Hemoglobin 16.7; Platelets 228.0; Potassium 4.5; Sodium 139  10/24/2022: Cholesterol 129; HDL 46.70; LDL Cholesterol 51; Total CHOL/HDL Ratio 3; Triglycerides 154.0; VLDL 30.8   Estimated Creatinine Clearance: 45.2 mL/min (by C-G formula based on SCr of 1.27 mg/dL).   Wt Readings from Last 3 Encounters:  11/09/22 190 lb 9.6 oz (86.5 kg)  10/24/22 189 lb (85.7 kg)  10/05/22 192 lb (87.1 kg)     Other studies reviewed: Additional studies/records reviewed today include: summarized above  ASSESSMENT AND PLAN:  PPM Intact function Programmed as above to try and provide better HR histogram, better exertional capacity  Will schedule a f/u visit 6-8 weeks if needed,  pending his response to programming changes If feeling well can move out to a year  Afib, suspect permanent at this point CHA2DS2Vasc is 4, on Eliquis, appropriately dosed No symptoms  HTN Looks goo  CAD Non-obstructive disease 2018 No anginal sounding symptoms C/w dr. McAlhany/team   Disposition: F/u with EP as above  Current medicines are reviewed at length with the patient today.  The patient did not have any concerns regarding medicines.  Norma Fredrickson, PA-C 11/09/2022 8:49 AM     [facility] [address] [place] [address] [phone] (office)  [phone] (fax)

## 2022-11-09 ENCOUNTER — Ambulatory Visit: Payer: HMO | Attending: Physician Assistant | Admitting: Physician Assistant

## 2022-11-09 ENCOUNTER — Encounter: Payer: Self-pay | Admitting: Physician Assistant

## 2022-11-09 VITALS — BP 124/82 | HR 65 | Ht 65.75 in | Wt 190.6 lb

## 2022-11-09 DIAGNOSIS — D6869 Other thrombophilia: Secondary | ICD-10-CM | POA: Diagnosis not present

## 2022-11-09 DIAGNOSIS — I495 Sick sinus syndrome: Secondary | ICD-10-CM

## 2022-11-09 DIAGNOSIS — I10 Essential (primary) hypertension: Secondary | ICD-10-CM | POA: Diagnosis not present

## 2022-11-09 DIAGNOSIS — I4821 Permanent atrial fibrillation: Secondary | ICD-10-CM

## 2022-11-09 DIAGNOSIS — Z950 Presence of cardiac pacemaker: Secondary | ICD-10-CM

## 2022-11-09 LAB — CUP PACEART INCLINIC DEVICE CHECK
Date Time Interrogation Session: 20240425122027
Implantable Lead Connection Status: 753985
Implantable Lead Connection Status: 753985
Implantable Lead Implant Date: 20230123
Implantable Lead Implant Date: 20230123
Implantable Lead Location: 753859
Implantable Lead Location: 753860
Implantable Lead Model: 377
Implantable Lead Model: 377
Implantable Lead Serial Number: 7000393970
Implantable Lead Serial Number: 8000680133
Implantable Pulse Generator Implant Date: 20230123
Lead Channel Pacing Threshold Amplitude: 0.8 V
Lead Channel Pacing Threshold Pulse Width: 0.4 ms
Lead Channel Sensing Intrinsic Amplitude: 1.8 mV
Lead Channel Sensing Intrinsic Amplitude: 24.6 mV
Pulse Gen Model: 407145
Pulse Gen Serial Number: 70294843

## 2022-11-09 NOTE — Patient Instructions (Signed)
Medication Instructions:   Your physician recommends that you continue on your current medications as directed. Please refer to the Current Medication list given to you today.  *If you need a refill on your cardiac medications before your next appointment, please call your pharmacy*   Lab Work:  NONE ORDERED  TODAY   If you have labs (blood work) drawn today and your tests are completely normal, you will receive your results only by: MyChart Message (if you have MyChart) OR A paper copy in the mail If you have any lab test that is abnormal or we need to change your treatment, we will call you to review the results.   Testing/Procedures: NONE ORDERED  TODAY    Follow-Up: [facility], you and your health needs are our priority.  As part of our continuing mission to provide you with exceptional heart care, we have created designated Provider Care Teams.  These Care Teams include your primary Cardiologist (physician) and Advanced Practice Providers (APPs -  Physician Assistants and Nurse Practitioners) who all work together to provide you with the care you need, when you need it.  We recommend signing up for the patient portal called "MyChart".  Sign up information is provided on this After Visit Summary.  MyChart is used to connect with patients for Virtual Visits (Telemedicine).  Patients are able to view lab/test results, encounter notes, upcoming appointments, etc.  Non-urgent messages can be sent to your provider as well.   To learn more about what you can do with MyChart, go to [email or web address].    Your next appointment:   4 -6 week(s)  Provider:   Francis Dowse, PA-C   Other Instructions

## 2022-11-24 ENCOUNTER — Encounter: Payer: Self-pay | Admitting: Primary Care

## 2022-11-24 ENCOUNTER — Ambulatory Visit (INDEPENDENT_AMBULATORY_CARE_PROVIDER_SITE_OTHER): Payer: HMO | Admitting: Primary Care

## 2022-11-24 VITALS — BP 142/86 | HR 60 | Temp 98.1°F | Ht 65.75 in | Wt 189.0 lb

## 2022-11-24 DIAGNOSIS — J302 Other seasonal allergic rhinitis: Secondary | ICD-10-CM | POA: Diagnosis not present

## 2022-11-24 DIAGNOSIS — R051 Acute cough: Secondary | ICD-10-CM | POA: Diagnosis not present

## 2022-11-24 LAB — POC COVID19 BINAXNOW: SARS Coronavirus 2 Ag: NEGATIVE

## 2022-11-24 MED ORDER — CETIRIZINE HCL 10 MG PO TABS: 10.0000 mg | ORAL_TABLET | Freq: Every day | ORAL | 0 refills | Status: AC

## 2022-11-24 NOTE — Assessment & Plan Note (Signed)
Symptoms and presentation today representative of allergy vs viral etiology.  Start with Zyrtec 10 mg HS. Also recommended Flonase BID.  He will update next week if he develops sick symptoms including worsening cough, fevers, fatigue, etc. Return precautions provided.   Rapid Covid-19 test today negative.

## 2022-11-24 NOTE — Patient Instructions (Signed)
Start Zyrtec 10 mg for allergies. Take 1 tablet by mouth at bedtime.  Try using Flonase (fluticasone) nasal spray for the runny nose. Instill 1 spray in each nostril twice daily.   Please call us if you develop fevers, begin to feel sick, or if you cough doesn't improve.  It was a pleasure meeting you!

## 2022-11-24 NOTE — Addendum Note (Signed)
Addended by: Lonia Blood on: 11/24/2022 02:38 PM   Modules accepted: Orders

## 2022-11-24 NOTE — Progress Notes (Signed)
Subjective:    Patient ID: Jonathan Griffith, male    DOB: 03-08-1941, 82 y.o.   MRN: [id number]  HPI  Jonathan Griffith is a very pleasant 82 y.o. male patient of Dr. Alphonsus Sias with a history of CAD, atrial fibrillation, hypertension, CKD, tobacco use (quit in 2009) who presents today to discuss cough.  Symptom onset 3-4 days ago with sneezing. He then developed rhinorrhea, watery eyes, mild headache, cough and chest congestion, but cough his mostly dry.   He has not tested for Covid-19 infection. He denies fevers, body aches, chills. Overall he feels well.   He's taken Tylenol and a "Cough and Cold Relief" with some improvement. He took "an allergy pill" last night.   Review of Systems  Constitutional:  Negative for chills and fever.  HENT:  Positive for congestion, rhinorrhea and sneezing. Negative for postnasal drip.   Respiratory:  Positive for cough.   Neurological:  Positive for headaches.         Past Medical History:  Diagnosis Date   Allergic rhinitis    Anginal pain (HCC)    CAD (coronary artery disease)    Chest pain    Duodenal ulcer    Dyspnea    On exertions   ED (erectile dysfunction)    GERD (gastroesophageal reflux disease)    History of colonoscopy    Hyperlipidemia    Hypertension    Osteoarthritis     Social History   Socioeconomic History   Marital status: Married    Spouse name: Not on file   Number of children: 2   Years of education: Not on file   Highest education level: Not on file  Occupational History   Occupation: Haematologist    Comment: retired  Tobacco Use   Smoking status: Former    Packs/day: 1.00    Years: 10.00    Additional pack years: 0.00    Total pack years: 10.00    Types: Cigars, Cigarettes    Quit date: 07/18/2007    Years since quitting: 15.3    Passive exposure: Past   Smokeless tobacco: Never   Tobacco comments:    Occ cigar - no cigarettes - stopped in 6-09  Vaping Use   Vaping Use: Never used  Substance and  Sexual Activity   Alcohol use: Yes    Alcohol/week: 1.0 standard drink of alcohol    Types: 1 Standard drinks or equivalent per week    Comment: 3-4 drinks per week   Drug use: No   Sexual activity: Not on file  Other Topics Concern   Not on file  Social History Narrative   Has living will   Wife would serve as health care POA--then son   Would accept resuscitation attempts but no prolonged artificial ventilation    No tube feeds if cognitively unaware   Social Determinants of Health   Financial Resource Strain: Not on file  Food Insecurity: Not on file  Transportation Needs: Not on file  Physical Activity: Not on file  Stress: Not on file  Social Connections: Not on file  Intimate Partner Violence: Not on file    Past Surgical History:  Procedure Laterality Date   APPENDECTOMY  1964   CARDIAC CATHETERIZATION  03/2010   diffuse early disease   CATARACT EXTRACTION Right 04/18/2016   INGUINAL HERNIA REPAIR Bilateral 1970   double hernia repair    LEFT HEART CATH AND CORONARY ANGIOGRAPHY N/A 10/12/2016   Procedure: Left Heart Cath and  Coronary Angiography;  Surgeon: Kathleene Hazel, MD;  Location: [facility] INVASIVE CV LAB;  Service: Cardiovascular;  Laterality: N/A;   PACEMAKER IMPLANT N/A 08/08/2021   Procedure: PACEMAKER IMPLANT;  Surgeon: Marinus Maw, MD;  Location: [facility] INVASIVE CV LAB;  Service: Cardiovascular;  Laterality: N/A;   RETINAL DETACHMENT SURGERY Right 2003   TONSILLECTOMY     TOTAL KNEE ARTHROPLASTY Right 09/10/2020   Procedure: RIGHT TOTAL KNEE ARTHROPLASTY;  Surgeon: Kathryne Hitch, MD;  Location: [facility] ORS;  Service: Orthopedics;  Laterality: Right;    Family History  Problem Relation Age of Onset   Coronary artery disease Mother        CABG in 74's   Brain cancer Mother         that was metastatic  from lung    Stomach cancer Father 65   Diabetes Sister    Stroke Maternal Grandmother     Allergies  Allergen Reactions   Ibuprofen      REACTION: ulcer    Current Outpatient Medications on File Prior to Visit  Medication Sig Dispense Refill   ALPHAGAN P 0.1 % SOLN Place 1 drop into the right eye in the morning.     apixaban (ELIQUIS) 5 MG TABS tablet Take 1 tablet (5 mg total) by mouth 2 (two) times daily. 60 tablet 11   Cholecalciferol (VITAMIN D-3) 125 MCG (5000 UT) TABS Take 5,000 Units by mouth at bedtime.     ILEVRO 0.3 % ophthalmic suspension Place 1 drop into the right eye at bedtime.     losartan (COZAAR) 50 MG tablet TAKE 1 TABLET BY MOUTH EVERY DAY 90 tablet 0   magnesium oxide (MAG-OX) 400 MG tablet Take 400 mg by mouth at bedtime.     Multiple Vitamins-Minerals (MULTIVITAMIN WITH MINERALS) tablet Take 1 tablet by mouth at bedtime.     nitroGLYCERIN (NITROSTAT) 0.4 MG SL tablet Place 1 tablet (0.4 mg total) under the tongue every 5 (five) minutes as needed for chest pain (x 3 doses). 25 tablet 3   omeprazole (PRILOSEC) 20 MG capsule Take 1 capsule (20 mg total) by mouth daily as needed. On an empty stomach 1 capsule 0   prednisoLONE acetate (PRED FORTE) 1 % ophthalmic suspension Place 1 drop into the right eye in the morning and at bedtime.     PROLENSA 0.07 % SOLN SMARTSIG:1 Drop(s) Right Eye Every Evening     simvastatin (ZOCOR) 40 MG tablet TAKE ONE TABLET BY MOUTH EACH EVENING 90 tablet 3   vitamin B-12 (CYANOCOBALAMIN) 500 MCG tablet Take 500 mcg by mouth in the morning.     No current facility-administered medications on file prior to visit.    BP (!) 142/86   Pulse 60   Temp 98.1 F (36.7 C) (Temporal)   Ht 5' 5.75" (1.67 m)   Wt 189 lb (85.7 kg)   SpO2 97%   BMI 30.74 kg/m  Objective:   Physical Exam Constitutional:      Appearance: He is not ill-appearing.  HENT:     Right Ear: Tympanic membrane and ear canal normal.     Left Ear: Tympanic membrane and ear canal normal.     Nose: No mucosal edema.     Right Sinus: No maxillary sinus tenderness or frontal sinus tenderness.     Left Sinus: No  maxillary sinus tenderness or frontal sinus tenderness.     Mouth/Throat:     Mouth: Mucous membranes are moist.  Eyes:  Conjunctiva/sclera: Conjunctivae normal.  Cardiovascular:     Rate and Rhythm: Normal rate and regular rhythm.  Pulmonary:     Effort: Pulmonary effort is normal.     Breath sounds: Normal breath sounds. No wheezing or rales.  Musculoskeletal:     Cervical back: Neck supple.  Skin:    General: Skin is warm and dry.           Assessment & Plan:  Acute cough Assessment & Plan: Symptoms and presentation today representative of allergy vs viral etiology.  Start with Zyrtec 10 mg HS. Also recommended Flonase BID.  He will update next week if he develops sick symptoms including worsening cough, fevers, fatigue, etc. Return precautions provided.   Rapid Covid-19 test today negative.    Seasonal allergies -     Cetirizine HCl; Take 1 tablet (10 mg total) by mouth at bedtime. For allergies  Dispense: 90 tablet; Refill: 0        Doreene Nest, NP

## 2022-12-07 ENCOUNTER — Ambulatory Visit: Payer: HMO | Admitting: Physician Assistant

## 2022-12-07 NOTE — Progress Notes (Signed)
Remote pacemaker transmission.   

## 2023-01-09 ENCOUNTER — Other Ambulatory Visit: Payer: Self-pay | Admitting: Cardiovascular Disease

## 2023-01-27 ENCOUNTER — Other Ambulatory Visit: Payer: Self-pay | Admitting: Internal Medicine

## 2023-02-05 ENCOUNTER — Ambulatory Visit (INDEPENDENT_AMBULATORY_CARE_PROVIDER_SITE_OTHER): Payer: HMO

## 2023-02-05 DIAGNOSIS — I495 Sick sinus syndrome: Secondary | ICD-10-CM | POA: Diagnosis not present

## 2023-02-06 LAB — CUP PACEART REMOTE DEVICE CHECK
Battery Voltage: 90
Date Time Interrogation Session: 20240722093517
Implantable Lead Connection Status: 753985
Implantable Lead Connection Status: 753985
Implantable Lead Implant Date: 20230123
Implantable Lead Implant Date: 20230123
Implantable Lead Location: 753859
Implantable Lead Location: 753860
Implantable Lead Model: 377
Implantable Lead Model: 377
Implantable Lead Serial Number: 7000393970
Implantable Lead Serial Number: 8000680133
Implantable Pulse Generator Implant Date: 20230123
Pulse Gen Model: 407145
Pulse Gen Serial Number: 70294843

## 2023-02-14 NOTE — Progress Notes (Signed)
Remote pacemaker transmission.   

## 2023-02-26 ENCOUNTER — Encounter (INDEPENDENT_AMBULATORY_CARE_PROVIDER_SITE_OTHER): Payer: HMO | Admitting: Ophthalmology

## 2023-02-26 DIAGNOSIS — H43813 Vitreous degeneration, bilateral: Secondary | ICD-10-CM

## 2023-02-26 DIAGNOSIS — H35033 Hypertensive retinopathy, bilateral: Secondary | ICD-10-CM | POA: Diagnosis not present

## 2023-02-26 DIAGNOSIS — H35371 Puckering of macula, right eye: Secondary | ICD-10-CM | POA: Diagnosis not present

## 2023-02-26 DIAGNOSIS — H338 Other retinal detachments: Secondary | ICD-10-CM | POA: Diagnosis not present

## 2023-02-26 DIAGNOSIS — I10 Essential (primary) hypertension: Secondary | ICD-10-CM | POA: Diagnosis not present

## 2023-02-26 DIAGNOSIS — H59031 Cystoid macular edema following cataract surgery, right eye: Secondary | ICD-10-CM

## 2023-02-27 ENCOUNTER — Encounter: Payer: HMO | Admitting: Internal Medicine

## 2023-03-16 NOTE — Progress Notes (Unsigned)
  Electrophysiology Office Note:   ID:  Jonathan Griffith, Jonathan Griffith 14-Dec-1940, MRN [id number]  Primary Cardiologist: Verne Carrow, MD Electrophysiologist: Lewayne Bunting, MD  {Click to update primary MD,subspecialty MD or APP then REFRESH:1}    History of Present Illness:   Jonathan Griffith is a 82 y.o. male with h/o AF, tachy brady s/p PPM, HTN, HLD, and mod non-obstructive CAD by cath seen today for routine electrophysiology followup.   Seen in clinic by [facility] 11/09/2022. Reported getting winded at times with exertion. CLS turned on 60/120, (medium +20)  Since last being seen in our clinic the patient reports doing ***.  he denies chest pain, palpitations, dyspnea, PND, orthopnea, nausea, vomiting, dizziness, syncope, edema, weight gain, or early satiety.   Review of systems complete and found to be negative unless listed in HPI.      EP Information / Studies Reviewed:    EKG is not ordered today. EKG from 11/09/2022 reviewed which showed rate controlled AF/AFL       PPM Interrogation-  reviewed in detail today,  See PACEART report.  Device information Biotronik dual chamber PPM implanted 08/08/21 RV septal position   Physical Exam:   VS:  There were no vitals taken for this visit.   Wt Readings from Last 3 Encounters:  11/24/22 189 lb (85.7 kg)  11/09/22 190 lb 9.6 oz (86.5 kg)  10/24/22 189 lb (85.7 kg)     GEN: Well nourished, well developed in no acute distress NECK: No JVD; No carotid bruits CARDIAC: {EPRHYTHM:28826}, no murmurs, rubs, gallops RESPIRATORY:  Clear to auscultation without rales, wheezing or rhonchi  ABDOMEN: Soft, non-tender, non-distended EXTREMITIES:  No edema; No deformity   ASSESSMENT AND PLAN:    Tachy-Brady syndrome s/p Biotronik PPM  Normal PPM function See Pace Art report No changes today  Permanent AF Continue eliquis for CHA2DS2VASc  of at least 4.  Rates controlled  HTN Stable on current regimen  CAD No s/s ischemia   {Click  here to Review PMH, Prob List, Meds, Allergies, SHx, FHx  :1}   Disposition:   Follow up with {EPPROVIDERS:28135} {EPFOLLOW UP:28173}  Signed, Graciella Freer, PA-C

## 2023-03-17 ENCOUNTER — Encounter: Payer: Self-pay | Admitting: *Deleted

## 2023-03-17 ENCOUNTER — Ambulatory Visit
Admission: EM | Admit: 2023-03-17 | Discharge: 2023-03-17 | Disposition: A | Discharge status: Home / Self Care | Payer: HMO | Attending: Family Medicine | Admitting: Family Medicine

## 2023-03-17 ENCOUNTER — Other Ambulatory Visit: Payer: Self-pay

## 2023-03-17 DIAGNOSIS — Z91038 Other insect allergy status: Secondary | ICD-10-CM

## 2023-03-17 DIAGNOSIS — L03114 Cellulitis of left upper limb: Secondary | ICD-10-CM

## 2023-03-17 MED ORDER — DEXAMETHASONE SODIUM PHOSPHATE 10 MG/ML IJ SOLN
10.0000 mg | Freq: Once | INTRAMUSCULAR | Status: AC
  Administered 2023-03-17: 10 mg via INTRAMUSCULAR

## 2023-03-17 MED ORDER — DOXYCYCLINE HYCLATE 100 MG PO CAPS: 100.0000 mg | ORAL_CAPSULE | Freq: Two times a day (BID) | ORAL | 0 refills | Status: DC

## 2023-03-17 MED ORDER — TRIAMCINOLONE ACETONIDE 0.1 % EX CREA: 1.0000 | TOPICAL_CREAM | Freq: Two times a day (BID) | CUTANEOUS | 0 refills | Status: DC | PRN

## 2023-03-17 NOTE — ED Triage Notes (Signed)
Pt reports he was picking okra yesterday morning and after 4PM his Lt arm started to swell and turn red. Swelling and redness extend up Lt forearm. Pt took a benadryl last night for itching.

## 2023-03-17 NOTE — ED Provider Notes (Signed)
Jonathan Griffith    CSN: [id number] Arrival date & time: 03/17/23  1028      History   Chief Complaint Chief Complaint  Patient presents with   Skin Problem    HPI Jonathan Griffith is a 82 y.o. male.   HPI Patient presents today with left hand and left forearm swelling after working in the garden yesterday.  Patient does not recall being bitten or stung by any insect however after removing his hands from an [place] bush he noticed swelling and redness on the dorsum of his hand.  As the day progressed the swelling and redness expanded to his forearm.  He endorses severe itching and pain.  On awakening this morning his hand swelling had progressively worsened to involve his fingers and he has noticed to spots that appear to be insect bite wound on lower aspect of his left hand.  He has taken 2 doses of Benadryl to help with the itching with last dose taken this morning.  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/2021   Pacemaker 11/08/2021   Hypertension 11/08/2021   Atrial fibrillation (HCC) 07/19/2021   Status post total right knee replacement 09/10/2020   Unilateral primary osteoarthritis, right knee 12/22/2019   Sinus bradycardia 11/07/2016   Sleep disorder 03/24/2016   Advanced directives, counseling/discussion 05/13/2014   GERD (gastroesophageal reflux disease)    Routine general medical examination at a health care facility 05/05/2011   Coronary artery disease with angina pectoris (HCC) 04/19/2010    Osteoarthritis of more than one site 03/15/2007   Hyperlipemia 03/04/2007   ALLERGIC RHINITIS 03/04/2007    Past Surgical History:  Procedure Laterality Date   APPENDECTOMY  1964   CARDIAC CATHETERIZATION  03/2010   diffuse early disease   CATARACT EXTRACTION Right 04/18/2016   INGUINAL HERNIA REPAIR Bilateral 1970   double hernia repair    LEFT HEART CATH AND CORONARY ANGIOGRAPHY N/A 10/12/2016   Procedure: Left Heart Cath and Coronary Angiography;  Surgeon: Kathleene Hazel, MD;  Location: [facility] INVASIVE CV LAB;  Service: Cardiovascular;  Laterality: N/A;   PACEMAKER IMPLANT N/A 08/08/2021   Procedure: PACEMAKER IMPLANT;  Surgeon: Marinus Maw, MD;  Location: [facility] INVASIVE CV LAB;  Service: Cardiovascular;  Laterality: N/A;   RETINAL DETACHMENT SURGERY Right 2003   TONSILLECTOMY     TOTAL KNEE ARTHROPLASTY Right 09/10/2020   Procedure: RIGHT TOTAL KNEE ARTHROPLASTY;  Surgeon: Kathryne Hitch, MD;  Location: [facility] ORS;  Service: Orthopedics;  Laterality: Right;       Home Medications    Prior to Admission medications   Medication Sig Start Date End Date Taking? Authorizing Provider  ALPHAGAN P 0.1 % SOLN Place 1 drop into the right eye in the morning. 12/16/19  Yes [provider]  apixaban (ELIQUIS) 5 MG TABS tablet Take 1 tablet (5 mg total) by mouth 2 (two) times daily. 05/22/22  Yes  Karie Schwalbe, MD  cetirizine (ZYRTEC) 10 MG tablet Take 1 tablet (10 mg total) by mouth at bedtime. For allergies 11/24/22  Yes Doreene Nest, NP  Cholecalciferol (VITAMIN D-3) 125 MCG (5000 UT) TABS Take 5,000 Units by mouth at bedtime.   Yes [provider]  doxycycline (VIBRAMYCIN) 100 MG capsule Take 1 capsule (100 mg total) by mouth 2 (two) times daily. 03/17/23  Yes Bing Neighbors, NP  ILEVRO 0.3 % ophthalmic suspension Place 1 drop into the right eye at bedtime. 01/05/20  Yes [provider]  losartan (COZAAR) 50 MG tablet TAKE 1 TABLET BY MOUTH  EVERY DAY 01/09/23  Yes Kathleene Hazel, MD  magnesium oxide (MAG-OX) 400 MG tablet Take 400 mg by mouth at bedtime.   Yes [provider]  Multiple Vitamins-Minerals (MULTIVITAMIN WITH MINERALS) tablet Take 1 tablet by mouth at bedtime.   Yes [provider]  PROLENSA 0.07 % SOLN SMARTSIG:1 Drop(s) Right Eye Every Evening 04/14/21  Yes [provider]  simvastatin (ZOCOR) 40 MG tablet TAKE ONE TABLET BY MOUTH EACH EVENING 01/29/23  Yes Tillman Abide I, MD  triamcinolone cream (KENALOG) 0.1 % Apply 1 Application topically 2 (two) times daily as needed. 03/17/23  Yes Bing Neighbors, NP  vitamin B-12 (CYANOCOBALAMIN) 500 MCG tablet Take 500 mcg by mouth in the morning.   Yes [provider]  nitroGLYCERIN (NITROSTAT) 0.4 MG SL tablet Place 1 tablet (0.4 mg total) under the tongue every 5 (five) minutes as needed for chest pain (x 3 doses). 10/24/22   Karie Schwalbe, MD  omeprazole (PRILOSEC) 20 MG capsule Take 1 capsule (20 mg total) by mouth daily as needed. On an empty stomach 10/18/21   Karie Schwalbe, MD  prednisoLONE acetate (PRED FORTE) 1 % ophthalmic suspension Place 1 drop into the right eye in the morning and at bedtime. 01/07/20   [provider]    Family History Family History  Problem Relation Age of Onset   Coronary artery disease Mother        CABG in 56's   Brain cancer Mother         that was metastatic  from lung    Stomach cancer Father 42   Diabetes Sister    Stroke Maternal Grandmother     Social History Social History   Tobacco Use   Smoking status: Former    Current packs/day: 0.00    Average packs/day: 1 pack/day for 10.0 years (10.0 ttl pk-yrs)    Types: Cigars, Cigarettes    Start date: 07/17/1997    Quit date: 07/18/2007    Years since quitting: 15.6    Passive exposure: Past   Smokeless tobacco: Never   Tobacco comments:    Occ cigar - no cigarettes - stopped in 6-09  Vaping Use   Vaping status:  Never Used  Substance Use Topics   Alcohol use: Yes    Alcohol/week: 1.0 standard drink of alcohol    Types: 1 Standard drinks or equivalent per week    Comment: 3-4 drinks per week   Drug use: No     Allergies   Ibuprofen  Review of Systems Review of Systems Pertinent negatives listed in HPI   Physical Exam Triage Vital Signs ED Triage Vitals  Encounter Vitals Group     BP 03/17/23 1041 (!) 153/85     Systolic BP Percentile --      Diastolic BP Percentile --  Pulse Rate 03/17/23 1041 82     Resp 03/17/23 1041 18     Temp 03/17/23 1041 97.6 F (36.4 C)     Temp Source 03/17/23 1041 Oral     SpO2 03/17/23 1041 96 %     Weight --      Height --      Head Circumference --      Peak Flow --      Pain Score 03/17/23 1044 0     Pain Loc --      Pain Education --      Exclude from Growth Chart --    No data found.  Updated Vital Signs BP (!) 153/85 (BP Location: Right Arm)   Pulse 82   Temp 97.6 F (36.4 C) (Oral)   Resp 18   SpO2 96%   Visual Acuity Right Eye Distance:   Left Eye Distance:   Bilateral Distance:    Right Eye Near:   Left Eye Near:    Bilateral Near:     Physical Exam Vitals reviewed.  Constitutional:      Appearance: Normal appearance.  HENT:     Head: Normocephalic and atraumatic.  Eyes:     Extraocular Movements: Extraocular movements intact.     Pupils: Pupils are equal, round, and reactive to light.  Cardiovascular:     Rate and Rhythm: Normal rate and regular rhythm.  Pulmonary:     Effort: Pulmonary effort is normal.     Breath sounds: Normal breath sounds.  Musculoskeletal:     Left forearm: Swelling present.     Left wrist: Swelling present. Decreased range of motion.     Left hand: Swelling and tenderness present. Decreased range of motion.     Comments: Left hand wrist forearm swelling, increased warmth, diffuse redness  Skin:    Capillary Refill: Capillary refill takes less than 2 seconds.  Neurological:      General: No focal deficit present.     Mental Status: He is alert.      UC Treatments / Results  Labs (all labs ordered are listed, but only abnormal results are displayed) Labs Reviewed - No data to display  EKG   Radiology No results found.  Procedures Procedures (including critical care time)  Medications Ordered in UC Medications  dexamethasone (DECADRON) injection 10 mg (10 mg Intramuscular Given 03/17/23 1112)    Initial Impression / Assessment and Plan / UC Course  I have reviewed the triage vital signs and the nursing notes.  Pertinent labs & imaging results that were available during my care of the patient were reviewed by me and considered in my medical decision making (see chart for details).    Cellulitis of hand and forearm of unknown etiology.  Suspect patient is having allergic reaction to an insect bite with a secondary infection.  Patient received a Decadron 10 mg IM injection here in clinic.  Doxycycline for skin infection. Triamcinolone cream twice daily as needed to hand and forearm for inflammation and itching. Strict return precautions given if symptoms do not appear to be improving within the next few days or if at any point symptoms worsen.  Patient verbalized understanding and agreement with plan. Final Clinical Impressions(s) / UC Diagnoses   Final diagnoses:  Cellulitis of hand, left  Cellulitis of forearm, left  Allergic reaction to insect bite     Discharge Instructions      You received a long-acting steroid injection here in clinic.  Start today  doxycycline 100 mg tablet take twice daily over the next 10 days this will treat the infection in your hand and arm.  Also apply triamcinolone cream twice daily this is a topical steroid which will reduce the itching and irritation on the hand and arm.  The swelling and irritation to the hand and arm should gradually improve over the next few days. If swelling and itching does not appear to be  improving return for evaluation.     ED Prescriptions     Medication Sig Dispense Auth. Provider   doxycycline (VIBRAMYCIN) 100 MG capsule Take 1 capsule (100 mg total) by mouth 2 (two) times daily. 20 capsule Bing Neighbors, NP   triamcinolone cream (KENALOG) 0.1 % Apply 1 Application topically 2 (two) times daily as needed. 160 g Bing Neighbors, NP      PDMP not reviewed this encounter.   Bing Neighbors, NP 03/17/23 [phone]

## 2023-03-17 NOTE — Discharge Instructions (Signed)
You received a long-acting steroid injection here in clinic.  Start today doxycycline 100 mg tablet take twice daily over the next 10 days this will treat the infection in your hand and arm.  Also apply triamcinolone cream twice daily this is a topical steroid which will reduce the itching and irritation on the hand and arm.  The swelling and irritation to the hand and arm should gradually improve over the next few days. If swelling and itching does not appear to be improving return for evaluation.

## 2023-03-20 ENCOUNTER — Telehealth: Payer: Self-pay | Admitting: Internal Medicine

## 2023-03-20 ENCOUNTER — Encounter: Payer: Self-pay | Admitting: Student

## 2023-03-20 ENCOUNTER — Ambulatory Visit: Payer: HMO | Attending: Internal Medicine | Admitting: Student

## 2023-03-20 VITALS — BP 152/76 | HR 70 | Ht 67.0 in | Wt 185.2 lb

## 2023-03-20 DIAGNOSIS — I10 Essential (primary) hypertension: Secondary | ICD-10-CM | POA: Diagnosis not present

## 2023-03-20 DIAGNOSIS — I4821 Permanent atrial fibrillation: Secondary | ICD-10-CM

## 2023-03-20 DIAGNOSIS — I495 Sick sinus syndrome: Secondary | ICD-10-CM

## 2023-03-20 DIAGNOSIS — Z950 Presence of cardiac pacemaker: Secondary | ICD-10-CM

## 2023-03-20 LAB — CUP PACEART INCLINIC DEVICE CHECK
Date Time Interrogation Session: 20240903090801
Implantable Lead Connection Status: 753985
Implantable Lead Connection Status: 753985
Implantable Lead Implant Date: 20230123
Implantable Lead Implant Date: 20230123
Implantable Lead Location: 753859
Implantable Lead Location: 753860
Implantable Lead Model: 377
Implantable Lead Model: 377
Implantable Lead Serial Number: 7000393970
Implantable Lead Serial Number: 8000680133
Implantable Pulse Generator Implant Date: 20230123
Pulse Gen Model: 407145
Pulse Gen Serial Number: 70294843

## 2023-03-20 NOTE — Telephone Encounter (Signed)
Patient was seen on 8/31 in urgent care

## 2023-03-20 NOTE — Patient Instructions (Signed)
Medication Instructions:  Your physician recommends that you continue on your current medications as directed. Please refer to the Current Medication list given to you today.  *If you need a refill on your cardiac medications before your next appointment, please call your pharmacy*  Lab Work: None ordered If you have labs (blood work) drawn today and your tests are completely normal, you will receive your results only by: MyChart Message (if you have MyChart) OR A paper copy in the mail If you have any lab test that is abnormal or we need to change your treatment, we will call you to review the results.  Follow-Up: At [facility], you and your health needs are our priority.  As part of our continuing mission to provide you with exceptional heart care, we have created designated Provider Care Teams.  These Care Teams include your primary Cardiologist (physician) and Advanced Practice Providers (APPs -  Physician Assistants and Nurse Practitioners) who all work together to provide you with the care you need, when you need it.  Your next appointment:   1 year(s)  Provider:   Gregg Taylor, MD  

## 2023-03-20 NOTE — Telephone Encounter (Signed)
Was seen at urgent care and given doxycycline. Seen at cardiology today--no mention of any trouble with hand at that visit

## 2023-03-27 DIAGNOSIS — L814 Other melanin hyperpigmentation: Secondary | ICD-10-CM | POA: Diagnosis not present

## 2023-03-27 DIAGNOSIS — Z85828 Personal history of other malignant neoplasm of skin: Secondary | ICD-10-CM | POA: Diagnosis not present

## 2023-03-27 DIAGNOSIS — L578 Other skin changes due to chronic exposure to nonionizing radiation: Secondary | ICD-10-CM | POA: Diagnosis not present

## 2023-03-27 DIAGNOSIS — L821 Other seborrheic keratosis: Secondary | ICD-10-CM | POA: Diagnosis not present

## 2023-03-27 DIAGNOSIS — L570 Actinic keratosis: Secondary | ICD-10-CM | POA: Diagnosis not present

## 2023-03-28 DIAGNOSIS — H35373 Puckering of macula, bilateral: Secondary | ICD-10-CM | POA: Diagnosis not present

## 2023-03-28 DIAGNOSIS — H35353 Cystoid macular degeneration, bilateral: Secondary | ICD-10-CM | POA: Diagnosis not present

## 2023-03-28 DIAGNOSIS — H40013 Open angle with borderline findings, low risk, bilateral: Secondary | ICD-10-CM | POA: Diagnosis not present

## 2023-03-28 DIAGNOSIS — H40051 Ocular hypertension, right eye: Secondary | ICD-10-CM | POA: Diagnosis not present

## 2023-03-28 DIAGNOSIS — H524 Presbyopia: Secondary | ICD-10-CM | POA: Diagnosis not present

## 2023-05-08 ENCOUNTER — Ambulatory Visit (INDEPENDENT_AMBULATORY_CARE_PROVIDER_SITE_OTHER): Payer: HMO

## 2023-05-08 DIAGNOSIS — I495 Sick sinus syndrome: Secondary | ICD-10-CM | POA: Diagnosis not present

## 2023-05-09 LAB — CUP PACEART REMOTE DEVICE CHECK
Date Time Interrogation Session: 20241022071309
Implantable Lead Connection Status: 753985
Implantable Lead Connection Status: 753985
Implantable Lead Implant Date: 20230123
Implantable Lead Implant Date: 20230123
Implantable Lead Location: 753859
Implantable Lead Location: 753860
Implantable Lead Model: 377
Implantable Lead Model: 377
Implantable Lead Serial Number: 7000393970
Implantable Lead Serial Number: 8000680133
Implantable Pulse Generator Implant Date: 20230123
Pulse Gen Model: 407145
Pulse Gen Serial Number: 70294843

## 2023-05-14 DIAGNOSIS — H1131 Conjunctival hemorrhage, right eye: Secondary | ICD-10-CM | POA: Diagnosis not present

## 2023-05-14 DIAGNOSIS — H18232 Secondary corneal edema, left eye: Secondary | ICD-10-CM | POA: Diagnosis not present

## 2023-05-25 NOTE — Progress Notes (Signed)
Remote pacemaker transmission.   

## 2023-06-18 NOTE — Progress Notes (Unsigned)
No chief complaint on file.  History of Present Illness: 82 yo male with history of CAD, HTN, hyperlipidemia, GERD, atrial fibrillation and sick sinus syndrome s/p pacemaker implant in January 2023 who is here today for cardiac follow up. He is known to have mild to moderate CAD with last cath in March 2018 showing 40% ostial LAD stenosis and mild disease in the RCA and intermediate Araki. LV function was normal. He has been seen in the Pulmonary clinic for workup of dyspnea and is not felt to have significant lung disease. He has described mild chest pain with heavy exertion for years. He did not tolerate Imdur and has not been willing to try Ranexa. Echo 06/28/21 with LVEF=60-65%, no significant valve disease. He recently had a fall and was found to have a rib fracture on the left. Seen in primary care by Dr. Alphonsus Sias 07/19/20 and atrial fib noted. He was started on Eliquis. Cardiac monitor with sinus pause in January 2023. Given near syncope, pacemaker implanted January 2023.   He is here today for follow up. The patient denies any chest pain, dyspnea, palpitations, lower extremity edema, orthopnea, PND, dizziness, near syncope or syncope.   Primary Care Physician: Karie Schwalbe, MD  Past Medical History:  Diagnosis Date   Allergic rhinitis    Anginal pain (HCC)    CAD (coronary artery disease)    Chest pain    Duodenal ulcer    Dyspnea    On exertions   ED (erectile dysfunction)    GERD (gastroesophageal reflux disease)    History of colonoscopy    Hyperlipidemia    Hypertension    Osteoarthritis     Past Surgical History:  Procedure Laterality Date   APPENDECTOMY  1964   CARDIAC CATHETERIZATION  03/2010   diffuse early disease   CATARACT EXTRACTION Right 04/18/2016   INGUINAL HERNIA REPAIR Bilateral 1970   double hernia repair    LEFT HEART CATH AND CORONARY ANGIOGRAPHY N/A 10/12/2016   Procedure: Left Heart Cath and Coronary Angiography;  Surgeon: Kathleene Hazel, MD;  Location: [facility] INVASIVE CV LAB;  Service: Cardiovascular;  Laterality: N/A;   PACEMAKER IMPLANT N/A 08/08/2021   Procedure: PACEMAKER IMPLANT;  Surgeon: Marinus Maw, MD;  Location: [facility] INVASIVE CV LAB;  Service: Cardiovascular;  Laterality: N/A;   RETINAL DETACHMENT SURGERY Right 2003   TONSILLECTOMY     TOTAL KNEE ARTHROPLASTY Right 09/10/2020   Procedure: RIGHT TOTAL KNEE ARTHROPLASTY;  Surgeon: Kathryne Hitch, MD;  Location: [facility] OR[facility];  Service: Orthopedics;  Laterality: Right;    Current Outpatient Medications  Medication Sig Dispense Refill   ALPHAGAN P 0.1 % SOLN Place 1 drop into the right eye in the morning.     apixaban (ELIQUIS) 5 MG TABS tablet Take 1 tablet (5 mg total) by mouth 2 (two) times daily. 60 tablet 11   cetirizine (ZYRTEC) 10 MG tablet Take 1 tablet (10 mg total) by mouth at bedtime. For allergies 90 tablet 0   Cholecalciferol (VITAMIN D-3) 125 MCG (5000 UT) TABS Take 5,000 Units by mouth at bedtime.     doxycycline (VIBRAMYCIN) 100 MG capsule Take 1 capsule (100 mg total) by mouth 2 (two) times daily. 20 capsule 0   ILEVRO 0.3 % ophthalmic suspension Place 1 drop into the right eye at bedtime.     losartan (COZAAR) 50 MG tablet TAKE 1 TABLET BY MOUTH EVERY DAY 90 tablet 3   magnesium oxide (MAG-OX) 400 MG tablet  Take 400 mg by mouth at bedtime.     Multiple Vitamins-Minerals (MULTIVITAMIN WITH MINERALS) tablet Take 1 tablet by mouth at bedtime.     nitroGLYCERIN (NITROSTAT) 0.4 MG SL tablet Place 1 tablet (0.4 mg total) under the tongue every 5 (five) minutes as needed for chest pain (x 3 doses). 25 tablet 3   omeprazole (PRILOSEC) 20 MG capsule Take 1 capsule (20 mg total) by mouth daily as needed. On an empty stomach 1 capsule 0   prednisoLONE acetate (PRED FORTE) 1 % ophthalmic suspension Place 1 drop into the right eye in the morning and at bedtime.     PROLENSA 0.07 % SOLN SMARTSIG:1 Drop(s) Right Eye Every Evening     simvastatin (ZOCOR) 40 MG  tablet TAKE ONE TABLET BY MOUTH EACH EVENING 90 tablet 3   triamcinolone cream (KENALOG) 0.1 % Apply 1 Application topically 2 (two) times daily as needed. 160 g 0   vitamin B-12 (CYANOCOBALAMIN) 500 MCG tablet Take 500 mcg by mouth in the morning.     No current facility-administered medications for this visit.    Allergies  Allergen Reactions   Ibuprofen     REACTION: ulcer    Social History   Socioeconomic History   Marital status: Married    Spouse name: Not on file   Number of children: 2   Years of education: Not on file   Highest education level: Not on file  Occupational History   Occupation: Haematologist    Comment: retired  Tobacco Use   Smoking status: Former    Current packs/day: 0.00    Average packs/day: 1 pack/day for 10.0 years (10.0 ttl pk-yrs)    Types: Cigars, Cigarettes    Start date: 07/17/1997    Quit date: 07/18/2007    Years since quitting: 15.9    Passive exposure: Past   Smokeless tobacco: Never   Tobacco comments:    Occ cigar - no cigarettes - stopped in 6-09  Vaping Use   Vaping status: Never Used  Substance and Sexual Activity   Alcohol use: Yes    Alcohol/week: 1.0 standard drink of alcohol    Types: 1 Standard drinks or equivalent per week    Comment: 3-4 drinks per week   Drug use: No   Sexual activity: Not on file  Other Topics Concern   Not on file  Social History Narrative   Has living will   Wife would serve as health care POA--then son   Would accept resuscitation attempts but no prolonged artificial ventilation    No tube feeds if cognitively unaware   Social Determinants of Health   Financial Resource Strain: Not on file  Food Insecurity: Not on file  Transportation Needs: Not on file  Physical Activity: Not on file  Stress: Not on file  Social Connections: Not on file  Intimate Partner Violence: Not on file    Family History  Problem Relation Age of Onset   Coronary artery disease Mother        CABG in 1's    Brain cancer Mother         that was metastatic  from lung    Stomach cancer Father 66   Diabetes Sister    Stroke Maternal Grandmother     Review of Systems:  As stated in the HPI and otherwise negative.   There were no vitals taken for this visit.  Physical Examination: General: Well developed, well nourished, NAD  HEENT: OP clear, mucus  membranes moist  SKIN: warm, dry. No rashes. Neuro: No focal deficits  Musculoskeletal: Muscle strength 5/5 all ext  Psychiatric: Mood and affect normal  Neck: No JVD, no carotid bruits, no thyromegaly, no lymphadenopathy.  Lungs:Clear bilaterally, no wheezes, rhonci, crackles Cardiovascular: Regular rate and rhythm. No murmurs, gallops or rubs. Abdomen:Soft. Bowel sounds present. Non-tender.  Extremities: No lower extremity edema. Pulses are 2 + in the bilateral DP/PT.  EKG:  EKG is *** ordered today. The ekg ordered today demonstrates   Recent Labs: 10/24/2022: ALT 13; BUN 22; Creatinine, Ser 1.27; Hemoglobin 16.7; Platelets 228.0; Potassium 4.5; Sodium 139   Lipid Panel    Component Value Date/Time   CHOL 129 10/24/2022 0807   TRIG 154.0 (H) 10/24/2022 0807   HDL 46.70 10/24/2022 0807   CHOLHDL 3 10/24/2022 0807   VLDL 30.8 10/24/2022 0807   LDLCALC 51 10/24/2022 0807   LDLDIRECT 131.3 04/06/2009 0922     Wt Readings from Last 3 Encounters:  03/20/23 84 kg  11/24/22 85.7 kg  11/09/22 86.5 kg    Assessment and Plan:   1. CAD with stable angina:  No change in baseline chest pain. His angina is felt to be due to microvascular disease. Cardiac cath in March 2018 with stable mild CAD. He never tried Ranexa. He did not tolerate Imdur. He is off of ASA since he is now on Eliquis. Continue statin.   2. HTN: BP is well controlled. No changes  3. Hyperlipidemia: Lipids followed in primary care. LDL 51 in April 2024. Continue statin.    4. Persistent atrial fibrillation: Sinus today. Continue Eliquis.    5. Sick sinus syndrome:  Pacemaker in place.   Labs/ tests ordered today include:   No orders of the defined types were placed in this encounter.  Disposition:   F/U with me in 12 months.    Signed, Verne Carrow, MD 06/18/2023 5:07 PM    [facility] [address] [place], [place], [place]  [address] Phone: [phone]; Fax: [phone]

## 2023-06-19 ENCOUNTER — Encounter: Payer: Self-pay | Admitting: Cardiovascular Disease

## 2023-06-19 ENCOUNTER — Other Ambulatory Visit: Payer: Self-pay | Admitting: Internal Medicine

## 2023-06-19 ENCOUNTER — Ambulatory Visit: Payer: HMO | Attending: Cardiovascular Disease | Admitting: Cardiovascular Disease

## 2023-06-19 VITALS — BP 112/74 | HR 74 | Ht 67.0 in | Wt 188.2 lb

## 2023-06-19 DIAGNOSIS — I4821 Permanent atrial fibrillation: Secondary | ICD-10-CM | POA: Diagnosis not present

## 2023-06-19 DIAGNOSIS — I25119 Atherosclerotic heart disease of native coronary artery with unspecified angina pectoris: Secondary | ICD-10-CM

## 2023-06-19 DIAGNOSIS — I10 Essential (primary) hypertension: Secondary | ICD-10-CM | POA: Diagnosis not present

## 2023-06-19 NOTE — Patient Instructions (Signed)

## 2023-06-26 DIAGNOSIS — H40013 Open angle with borderline findings, low risk, bilateral: Secondary | ICD-10-CM | POA: Diagnosis not present

## 2023-06-26 DIAGNOSIS — H40051 Ocular hypertension, right eye: Secondary | ICD-10-CM | POA: Diagnosis not present

## 2023-07-23 ENCOUNTER — Encounter (INDEPENDENT_AMBULATORY_CARE_PROVIDER_SITE_OTHER): Payer: HMO | Admitting: Ophthalmology

## 2023-07-23 DIAGNOSIS — H43811 Vitreous degeneration, right eye: Secondary | ICD-10-CM

## 2023-07-23 DIAGNOSIS — H59031 Cystoid macular edema following cataract surgery, right eye: Secondary | ICD-10-CM | POA: Diagnosis not present

## 2023-07-23 DIAGNOSIS — I10 Essential (primary) hypertension: Secondary | ICD-10-CM | POA: Diagnosis not present

## 2023-07-23 DIAGNOSIS — H33101 Unspecified retinoschisis, right eye: Secondary | ICD-10-CM | POA: Diagnosis not present

## 2023-07-23 DIAGNOSIS — H35371 Puckering of macula, right eye: Secondary | ICD-10-CM | POA: Diagnosis not present

## 2023-07-23 DIAGNOSIS — H35031 Hypertensive retinopathy, right eye: Secondary | ICD-10-CM | POA: Diagnosis not present

## 2023-07-23 DIAGNOSIS — H338 Other retinal detachments: Secondary | ICD-10-CM

## 2023-08-07 ENCOUNTER — Ambulatory Visit (INDEPENDENT_AMBULATORY_CARE_PROVIDER_SITE_OTHER): Payer: HMO

## 2023-08-07 DIAGNOSIS — I495 Sick sinus syndrome: Secondary | ICD-10-CM

## 2023-08-07 LAB — CUP PACEART REMOTE DEVICE CHECK
Date Time Interrogation Session: 20250121075827
Implantable Lead Connection Status: 753985
Implantable Lead Connection Status: 753985
Implantable Lead Implant Date: 20230123
Implantable Lead Implant Date: 20230123
Implantable Lead Location: 753859
Implantable Lead Location: 753860
Implantable Lead Model: 377
Implantable Lead Model: 377
Implantable Lead Serial Number: 7000393970
Implantable Lead Serial Number: 8000680133
Implantable Pulse Generator Implant Date: 20230123
Pulse Gen Model: 407145
Pulse Gen Serial Number: 70294843

## 2023-08-08 ENCOUNTER — Encounter: Payer: Self-pay | Admitting: Internal Medicine

## 2023-09-17 NOTE — Progress Notes (Signed)
 Remote pacemaker transmission.

## 2023-09-24 DIAGNOSIS — H40013 Open angle with borderline findings, low risk, bilateral: Secondary | ICD-10-CM | POA: Diagnosis not present

## 2023-09-24 DIAGNOSIS — H18232 Secondary corneal edema, left eye: Secondary | ICD-10-CM | POA: Diagnosis not present

## 2023-10-17 ENCOUNTER — Other Ambulatory Visit: Payer: Self-pay | Admitting: Cardiovascular Disease

## 2023-10-26 ENCOUNTER — Encounter: Payer: Self-pay | Admitting: Internal Medicine

## 2023-10-26 ENCOUNTER — Ambulatory Visit (INDEPENDENT_AMBULATORY_CARE_PROVIDER_SITE_OTHER): Payer: HMO | Admitting: Internal Medicine

## 2023-10-26 VITALS — BP 130/84 | HR 66 | Temp 97.6°F | Ht 65.25 in | Wt 194.0 lb

## 2023-10-26 DIAGNOSIS — I25119 Atherosclerotic heart disease of native coronary artery with unspecified angina pectoris: Secondary | ICD-10-CM | POA: Diagnosis not present

## 2023-10-26 DIAGNOSIS — I4891 Unspecified atrial fibrillation: Secondary | ICD-10-CM | POA: Diagnosis not present

## 2023-10-26 DIAGNOSIS — M150 Primary generalized (osteo)arthritis: Secondary | ICD-10-CM

## 2023-10-26 DIAGNOSIS — N1831 Chronic kidney disease, stage 3a: Secondary | ICD-10-CM | POA: Diagnosis not present

## 2023-10-26 DIAGNOSIS — Z0000 Encounter for general adult medical examination without abnormal findings: Secondary | ICD-10-CM

## 2023-10-26 LAB — CBC
HCT: 48.4 % (ref 39.0–52.0)
Hemoglobin: 16.2 g/dL (ref 13.0–17.0)
MCHC: 33.4 g/dL (ref 30.0–36.0)
MCV: 92.4 fl (ref 78.0–100.0)
Platelets: 236 10*3/uL (ref 150.0–400.0)
RBC: 5.24 Mil/uL (ref 4.22–5.81)
RDW: 13.5 % (ref 11.5–15.5)
WBC: 7.1 10*3/uL (ref 4.0–10.5)

## 2023-10-26 LAB — HEPATIC FUNCTION PANEL
ALT: 11 U/L (ref 0–53)
AST: 18 U/L (ref 0–37)
Albumin: 4.4 g/dL (ref 3.5–5.2)
Alkaline Phosphatase: 113 U/L (ref 39–117)
Bilirubin, Direct: 0.2 mg/dL (ref 0.0–0.3)
Total Bilirubin: 0.8 mg/dL (ref 0.2–1.2)
Total Protein: 6.7 g/dL (ref 6.0–8.3)

## 2023-10-26 LAB — LIPID PANEL
Cholesterol: 132 mg/dL (ref 0–200)
HDL: 46.1 mg/dL (ref >39.00)
LDL Cholesterol: 53 mg/dL (ref 0–99)
NonHDL: 86.28
Total CHOL/HDL Ratio: 3
Triglycerides: 167 mg/dL — ABNORMAL HIGH (ref 0.0–149.0)
VLDL: 33.4 mg/dL (ref 0.0–40.0)

## 2023-10-26 LAB — RENAL FUNCTION PANEL
Albumin: 4.4 g/dL (ref 3.5–5.2)
BUN: 21 mg/dL (ref 6–23)
CO2: 32 meq/L (ref 19–32)
Calcium: 9.2 mg/dL (ref 8.4–10.5)
Chloride: 99 meq/L (ref 96–112)
Creatinine, Ser: 1.26 mg/dL (ref 0.40–1.50)
GFR: 52.49 mL/min — ABNORMAL LOW
Glucose, Bld: 101 mg/dL — ABNORMAL HIGH (ref 70–99)
Phosphorus: 2.8 mg/dL (ref 2.3–4.6)
Potassium: 4.8 meq/L (ref 3.5–5.1)
Sodium: 139 meq/L (ref 135–145)

## 2023-10-26 LAB — VITAMIN D 25 HYDROXY (VIT D DEFICIENCY, FRACTURES): VITD: 45.22 ng/mL (ref 30.00–100.00)

## 2023-10-26 NOTE — Assessment & Plan Note (Signed)
 Stable exertional angina for many years On eliquis, losartan, simvastatin Hasn't needed nitroglycerin

## 2023-10-26 NOTE — Assessment & Plan Note (Signed)
 Does well with diclofenac gel on hands

## 2023-10-26 NOTE — Assessment & Plan Note (Signed)
 Stable mildly low GFR Is on the losartan 50mg  daily

## 2023-10-26 NOTE — Assessment & Plan Note (Signed)
 I have personally reviewed the Medicare Annual Wellness questionnaire and have noted 1. The patient's medical and social history 2. Their use of alcohol, tobacco or illicit drugs 3. Their current medications and supplements 4. The patient's functional ability including ADL's, fall risks, home safety risks and hearing or visual             impairment. 5. Diet and physical activities 6. Evidence for depression or mood disorders  The patients weight, height, BMI and visual acuity have been recorded in the chart I have made referrals, counseling and provided education to the patient based review of the above and I have provided the pt with a written personalized care plan for preventive services.  I have provided you with a copy of your personalized plan for preventive services. Please take the time to review along with your updated medication list.  Exercises regularly Done with cancer screening Prefers no COVID vaccines Flu/RSV in the fall

## 2023-10-26 NOTE — Progress Notes (Signed)
 Hearing Screening - Comments:: Passed whisper test Vision Screening - Comments:: November 2024

## 2023-10-26 NOTE — Assessment & Plan Note (Signed)
 Paced On elquis 5 bid No palpitations

## 2023-10-26 NOTE — Progress Notes (Signed)
 Subjective:    Patient ID: Jonathan Griffith, male    DOB: 22-Feb-1941, 83 y.o.   MRN: [id number]  HPI Here for Medicare wellness visit and follow up of chronic health conditions Reviewed advanced directives Reviewed other doctors---Dr Taylor--EP cardiology, Dr McAlhany--cardiology, Ms Haverstock--derm, Dr Madaline Brilliant, Dr Lanny Cramp, Dr Marlou Porch, Dr Haskel Schroeder No hospitalizations or surgery in the past year Exercises regularly--and does yard work Still enjoys 2 whiskeys a day No tobacco No falls No depression or anhedonia Vision is okay Hearing is fine Independent with instrumental ADLs No memory issues--just typical recall  Started with respiratory symptoms earlier this week After working in the yard Doesn't feel sick---just cough and congestion Improved with OTC med---?allergies  Has some trouble first thing getting out of bed/chair Legs feel unstable No dizziness or syncope No palpitations Will have some brief substernal pain walking up hill--better with rest. Hasn't needed the nitroglycerin Slight edema--indentation in socks  Has had stable mildly GFR at 52  Ongoing hand pain Uses diclofenac gel and it helps  Current Outpatient Medications on File Prior to Visit  Medication Sig Dispense Refill   ALPHAGAN P 0.1 % SOLN Place 1 drop into the right eye in the morning.     cetirizine (ZYRTEC) 10 MG tablet Take 1 tablet (10 mg total) by mouth at bedtime. For allergies 90 tablet 0   Cholecalciferol (VITAMIN D-3) 125 MCG (5000 UT) TABS Take 5,000 Units by mouth at bedtime.     ELIQUIS 5 MG TABS tablet TAKE 1 TABLET BY MOUTH TWICE A DAY 60 tablet 11   ILEVRO 0.3 % ophthalmic suspension Place 1 drop into the right eye at bedtime.     losartan (COZAAR) 50 MG tablet TAKE 1 TABLET BY MOUTH EVERY DAY 90 tablet 2   magnesium oxide (MAG-OX) 400 MG tablet Take 400 mg by mouth at bedtime.     Multiple Vitamins-Minerals (MULTIVITAMIN WITH MINERALS) tablet Take 1  tablet by mouth at bedtime.     nitroGLYCERIN (NITROSTAT) 0.4 MG SL tablet Place 1 tablet (0.4 mg total) under the tongue every 5 (five) minutes as needed for chest pain (x 3 doses). 25 tablet 3   omeprazole (PRILOSEC) 20 MG capsule Take 1 capsule (20 mg total) by mouth daily as needed. On an empty stomach 1 capsule 0   prednisoLONE acetate (PRED FORTE) 1 % ophthalmic suspension Place 1 drop into the right eye in the morning and at bedtime.     PROLENSA 0.07 % SOLN SMARTSIG:1 Drop(s) Right Eye Every Evening     simvastatin (ZOCOR) 40 MG tablet TAKE ONE TABLET BY MOUTH EACH EVENING 90 tablet 3   triamcinolone cream (KENALOG) 0.1 % Apply 1 Application topically 2 (two) times daily as needed. 160 g 0   vitamin B-12 (CYANOCOBALAMIN) 500 MCG tablet Take 500 mcg by mouth in the morning.     No current facility-administered medications on file prior to visit.    Allergies  Allergen Reactions   Ibuprofen     REACTION: ulcer    Past Medical History:  Diagnosis Date   Allergic rhinitis    Anginal pain (HCC)    CAD (coronary artery disease)    Chest pain    Duodenal ulcer    Dyspnea    On exertions   ED (erectile dysfunction)    GERD (gastroesophageal reflux disease)    History of colonoscopy    Hyperlipidemia    Hypertension    Osteoarthritis     Past Surgical History:  Procedure Laterality Date   APPENDECTOMY  1964   CARDIAC CATHETERIZATION  03/2010   diffuse early disease   CATARACT EXTRACTION Right 04/18/2016   INGUINAL HERNIA REPAIR Bilateral 1970   double hernia repair    LEFT HEART CATH AND CORONARY ANGIOGRAPHY N/A 10/12/2016   Procedure: Left Heart Cath and Coronary Angiography;  Surgeon: Kathleene Hazel, MD;  Location: [facility] INVASIVE CV LAB;  Service: Cardiovascular;  Laterality: N/A;   PACEMAKER IMPLANT N/A 08/08/2021   Procedure: PACEMAKER IMPLANT;  Surgeon: Marinus Maw, MD;  Location: [facility] INVASIVE CV LAB;  Service: Cardiovascular;  Laterality: N/A;   RETINAL  DETACHMENT SURGERY Right 2003   TONSILLECTOMY     TOTAL KNEE ARTHROPLASTY Right 09/10/2020   Procedure: RIGHT TOTAL KNEE ARTHROPLASTY;  Surgeon: Kathryne Hitch, MD;  Location: [facility];  Service: Orthopedics;  Laterality: Right;    Family History  Problem Relation Age of Onset   Coronary artery disease Mother        CABG in 81's   Brain cancer Mother         that was metastatic  from lung    Stomach cancer Father 44   Diabetes Sister    Stroke Maternal Grandmother     Social History   Socioeconomic History   Marital status: Married    Spouse name: Not on file   Number of children: 2   Years of education: Not on file   Highest education level: Not on file  Occupational History   Occupation: Haematologist    Comment: retired  Tobacco Use   Smoking status: Former    Current packs/day: 0.00    Average packs/day: 1 pack/day for 10.0 years (10.0 ttl pk-yrs)    Types: Cigars, Cigarettes    Start date: 07/17/1997    Quit date: 07/18/2007    Years since quitting: 16.2    Passive exposure: Past   Smokeless tobacco: Never   Tobacco comments:    Occ cigar - no cigarettes - stopped in 6-09  Vaping Use   Vaping status: Never Used  Substance and Sexual Activity   Alcohol use: Yes    Alcohol/week: 1.0 standard drink of alcohol    Types: 1 Standard drinks or equivalent per week    Comment: 3-4 drinks per week   Drug use: No   Sexual activity: Not on file  Other Topics Concern   Not on file  Social History Narrative   Has living will   Wife would serve as health care POA--then son   Would accept resuscitation attempts but no prolonged artificial ventilation    No tube feeds if cognitively unaware   Social Drivers of Health   Financial Resource Strain: Not on file  Food Insecurity: Not on file  Transportation Needs: Not on file  Physical Activity: Not on file  Stress: Not on file  Social Connections: Not on file  Intimate Partner Violence: Not on file   Review of  Systems Appetite is good Weight is up slightly from last year Ongoing sleep issues--nighttime awakening. No daytime somnolence--but can nap in chair at times Wears seat belt Teeth are okay No suspicious skin lesions Occasional heartburn----uses omeprazole prn. No dysphagia Bowels move fine--no blood Voids okay. Slow at times/dribbling---empties eventually     Objective:   Physical Exam Constitutional:      Appearance: Normal appearance.  HENT:     Mouth/Throat:     Pharynx: No oropharyngeal exudate or posterior oropharyngeal erythema.  Eyes:  Conjunctiva/sclera: Conjunctivae normal.     Pupils: Pupils are equal, round, and reactive to light.  Cardiovascular:     Rate and Rhythm: Normal rate and regular rhythm.     Pulses: Normal pulses.     Heart sounds: No murmur heard.    No gallop.  Pulmonary:     Effort: Pulmonary effort is normal.     Breath sounds: Normal breath sounds. No wheezing or rales.  Abdominal:     Palpations: Abdomen is soft.     Tenderness: There is no abdominal tenderness.  Musculoskeletal:     Cervical back: Neck supple.     Right lower leg: No edema.     Left lower leg: No edema.  Lymphadenopathy:     Cervical: No cervical adenopathy.  Skin:    Findings: No lesion or rash.  Neurological:     General: No focal deficit present.     Mental Status: He is alert and oriented to person, place, and time.     Comments: Mini-cog---clock wrong (dyslexic?) but recall 3/3  Psychiatric:        Mood and Affect: Mood normal.        Behavior: Behavior normal.            Assessment & Plan:

## 2023-10-28 LAB — PARATHYROID HORMONE, INTACT (NO CA): PTH: 40 pg/mL (ref 16–77)

## 2023-11-06 ENCOUNTER — Ambulatory Visit: Payer: HMO

## 2023-11-06 DIAGNOSIS — I495 Sick sinus syndrome: Secondary | ICD-10-CM | POA: Diagnosis not present

## 2023-11-07 ENCOUNTER — Encounter: Payer: Self-pay | Admitting: Internal Medicine

## 2023-11-07 LAB — CUP PACEART REMOTE DEVICE CHECK
Battery Voltage: 85
Date Time Interrogation Session: 20250422083516
Implantable Lead Connection Status: 753985
Implantable Lead Connection Status: 753985
Implantable Lead Implant Date: 20230123
Implantable Lead Implant Date: 20230123
Implantable Lead Location: 753859
Implantable Lead Location: 753860
Implantable Lead Model: 377
Implantable Lead Model: 377
Implantable Lead Serial Number: 7000393970
Implantable Lead Serial Number: 8000680133
Implantable Pulse Generator Implant Date: 20230123
Pulse Gen Model: 407145
Pulse Gen Serial Number: 70294843

## 2023-12-17 ENCOUNTER — Encounter (INDEPENDENT_AMBULATORY_CARE_PROVIDER_SITE_OTHER): Payer: HMO | Admitting: Ophthalmology

## 2023-12-17 DIAGNOSIS — H35033 Hypertensive retinopathy, bilateral: Secondary | ICD-10-CM

## 2023-12-17 DIAGNOSIS — H338 Other retinal detachments: Secondary | ICD-10-CM | POA: Diagnosis not present

## 2023-12-17 DIAGNOSIS — H35373 Puckering of macula, bilateral: Secondary | ICD-10-CM | POA: Diagnosis not present

## 2023-12-17 DIAGNOSIS — H43813 Vitreous degeneration, bilateral: Secondary | ICD-10-CM

## 2023-12-17 DIAGNOSIS — H59031 Cystoid macular edema following cataract surgery, right eye: Secondary | ICD-10-CM | POA: Diagnosis not present

## 2023-12-17 DIAGNOSIS — H33103 Unspecified retinoschisis, bilateral: Secondary | ICD-10-CM | POA: Diagnosis not present

## 2023-12-20 NOTE — Addendum Note (Signed)
 Addended by: Lott Rouleau A on: 12/20/2023 01:39 PM   Modules accepted: Orders

## 2023-12-20 NOTE — Progress Notes (Signed)
 Remote pacemaker transmission.

## 2023-12-27 DIAGNOSIS — H40013 Open angle with borderline findings, low risk, bilateral: Secondary | ICD-10-CM | POA: Diagnosis not present

## 2023-12-27 DIAGNOSIS — H18232 Secondary corneal edema, left eye: Secondary | ICD-10-CM | POA: Diagnosis not present

## 2024-01-20 ENCOUNTER — Other Ambulatory Visit: Payer: Self-pay | Admitting: Internal Medicine

## 2024-02-05 ENCOUNTER — Ambulatory Visit: Payer: HMO

## 2024-02-05 DIAGNOSIS — I495 Sick sinus syndrome: Secondary | ICD-10-CM | POA: Diagnosis not present

## 2024-02-06 ENCOUNTER — Ambulatory Visit: Payer: Self-pay | Admitting: Internal Medicine

## 2024-02-06 LAB — CUP PACEART REMOTE DEVICE CHECK
Battery Voltage: 85
Date Time Interrogation Session: 20250722100640
Implantable Lead Connection Status: 753985
Implantable Lead Connection Status: 753985
Implantable Lead Implant Date: 20230123
Implantable Lead Implant Date: 20230123
Implantable Lead Location: 753859
Implantable Lead Location: 753860
Implantable Lead Model: 377
Implantable Lead Model: 377
Implantable Lead Serial Number: 7000393970
Implantable Lead Serial Number: 8000680133
Implantable Pulse Generator Implant Date: 20230123
Pulse Gen Model: 407145
Pulse Gen Serial Number: 70294843

## 2024-02-21 ENCOUNTER — Ambulatory Visit (INDEPENDENT_AMBULATORY_CARE_PROVIDER_SITE_OTHER): Admitting: Internal Medicine

## 2024-02-21 ENCOUNTER — Encounter: Payer: Self-pay | Admitting: Internal Medicine

## 2024-02-21 VITALS — BP 130/80 | HR 72 | Temp 97.7°F | Ht 65.25 in | Wt 186.0 lb

## 2024-02-21 DIAGNOSIS — L255 Unspecified contact dermatitis due to plants, except food: Secondary | ICD-10-CM | POA: Insufficient documentation

## 2024-02-21 MED ORDER — TRIAMCINOLONE ACETONIDE 0.1 % EX CREA: 1.0000 | TOPICAL_CREAM | Freq: Two times a day (BID) | CUTANEOUS | 3 refills | Status: AC | PRN

## 2024-02-21 NOTE — Assessment & Plan Note (Signed)
 Mostly resolved but still some itching Will Rx triamcinolone  cream for prn use

## 2024-02-21 NOTE — Progress Notes (Signed)
 Subjective:    Patient ID: Jonathan Griffith, male    DOB: [birth year], 83 y.o.   MRN: [id number]  HPI Here due to an itchy rash  Got into some poison oak--but had gloves on Got some swelling and rash on left forearm Tried cortisone cream OTC---helped some Still has some spots there Now has 2 small spots on his right leg--just today  No fever No drainage Just doesn't resolve  Current Outpatient Medications on File Prior to Visit  Medication Sig Dispense Refill   ALPHAGAN  P 0.1 % SOLN Place 1 drop into the right eye in the morning.     cetirizine  (ZYRTEC ) 10 MG tablet Take 1 tablet (10 mg total) by mouth at bedtime. For allergies 90 tablet 0   Cholecalciferol  (VITAMIN D -3) 125 MCG (5000 UT) TABS Take 5,000 Units by mouth at bedtime.     ELIQUIS  5 MG TABS tablet TAKE 1 TABLET BY MOUTH TWICE A DAY 60 tablet 11   ILEVRO  0.3 % ophthalmic suspension Place 1 drop into the right eye at bedtime.     losartan  (COZAAR ) 50 MG tablet TAKE 1 TABLET BY MOUTH EVERY DAY 90 tablet 2   magnesium oxide (MAG-OX) 400 MG tablet Take 400 mg by mouth at bedtime.     Multiple Vitamins-Minerals (MULTIVITAMIN WITH MINERALS) tablet Take 1 tablet by mouth at bedtime.     nitroGLYCERIN  (NITROSTAT ) 0.4 MG SL tablet Place 1 tablet (0.4 mg total) under the tongue every 5 (five) minutes as needed for chest pain (x 3 doses). 25 tablet 3   omeprazole  (PRILOSEC) 20 MG capsule Take 1 capsule (20 mg total) by mouth daily as needed. On an empty stomach 1 capsule 0   prednisoLONE  acetate (PRED FORTE ) 1 % ophthalmic suspension Place 1 drop into the right eye in the morning and at bedtime.     PROLENSA 0.07 % SOLN SMARTSIG:1 Drop(s) Right Eye Every Evening     simvastatin  (ZOCOR ) 40 MG tablet TAKE ONE TABLET BY MOUTH EACH EVENING 90 tablet 3   triamcinolone  cream (KENALOG ) 0.1 % Apply 1 Application topically 2 (two) times daily as needed. 160 g 0   vitamin B-12 (CYANOCOBALAMIN ) 500 MCG tablet Take 500 mcg by mouth in the  morning.     No current facility-administered medications on file prior to visit.    Allergies  Allergen Reactions   Ibuprofen     REACTION: ulcer    Past Medical History:  Diagnosis Date   Allergic rhinitis    Anginal pain (HCC)    CAD (coronary artery disease)    Chest pain    Duodenal ulcer    Dyspnea    On exertions   ED (erectile dysfunction)    GERD (gastroesophageal reflux disease)    History of colonoscopy    Hyperlipidemia    Hypertension    Osteoarthritis     Past Surgical History:  Procedure Laterality Date   APPENDECTOMY  1964   CARDIAC CATHETERIZATION  03/2010   diffuse early disease   CATARACT EXTRACTION Right 04/18/2016   INGUINAL HERNIA REPAIR Bilateral 1970   double hernia repair    LEFT HEART CATH AND CORONARY ANGIOGRAPHY N/A 10/12/2016   Procedure: Left Heart Cath and Coronary Angiography;  Surgeon: Lonni JONETTA Cash, MD;  Location: [facility] INVASIVE CV LAB;  Service: Cardiovascular;  Laterality: N/A;   PACEMAKER IMPLANT N/A 08/08/2021   Procedure: PACEMAKER IMPLANT;  Surgeon: Waddell Danelle ORN, MD;  Location: [facility] INVASIVE CV LAB;  Service: Cardiovascular;  Laterality: N/A;   RETINAL DETACHMENT SURGERY Right 2003   TONSILLECTOMY     TOTAL KNEE ARTHROPLASTY Right 09/10/2020   Procedure: RIGHT TOTAL KNEE ARTHROPLASTY;  Surgeon: Vernetta Lonni GRADE, MD;  Location: [facility] OR[facility];  Service: Orthopedics;  Laterality: Right;    Family History  Problem Relation Age of Onset   Coronary artery disease Mother        CABG in 26's   Brain cancer Mother         that was metastatic  from lung    Stomach cancer Father 37   Diabetes Sister    Stroke Maternal Grandmother     Social History   Socioeconomic History   Marital status: Married    Spouse name: Not on file   Number of children: 2   Years of education: Not on file   Highest education level: Not on file  Occupational History   Occupation: Haematologist    Comment: retired  Tobacco Use   Smoking  status: Former    Current packs/day: 0.00    Average packs/day: 1 pack/day for 10.0 years (10.0 ttl pk-yrs)    Types: Cigars, Cigarettes    Start date: 07/17/1997    Quit date: 07/18/2007    Years since quitting: 16.6    Passive exposure: Past   Smokeless tobacco: Never   Tobacco comments:    Occ cigar - no cigarettes - stopped in 6-09  Vaping Use   Vaping status: Never Used  Substance and Sexual Activity   Alcohol use: Yes    Alcohol/week: 1.0 standard drink of alcohol    Types: 1 Standard drinks or equivalent per week    Comment: 3-4 drinks per week   Drug use: No   Sexual activity: Not on file  Other Topics Concern   Not on file  Social History Narrative   Has living will   Wife would serve as health care POA--then son   Would accept resuscitation attempts but no prolonged artificial ventilation    No tube feeds if cognitively unaware   Social Drivers of Health   Financial Resource Strain: Not on file  Food Insecurity: Not on file  Transportation Needs: Not on file  Physical Activity: Not on file  Stress: Not on file  Social Connections: Not on file  Intimate Partner Violence: Not on file   Review of Systems     Objective:   Physical Exam Skin:    Comments: Few scattered papules on volar left forearm No vesicles  2 hyperpigmented areas on medial right thigh--no inflammation            Assessment & Plan:

## 2024-03-14 ENCOUNTER — Ambulatory Visit: Admitting: Podiatry

## 2024-03-14 ENCOUNTER — Ambulatory Visit (INDEPENDENT_AMBULATORY_CARE_PROVIDER_SITE_OTHER)

## 2024-03-14 DIAGNOSIS — Z7901 Long term (current) use of anticoagulants: Secondary | ICD-10-CM | POA: Diagnosis not present

## 2024-03-14 DIAGNOSIS — M216X2 Other acquired deformities of left foot: Secondary | ICD-10-CM | POA: Diagnosis not present

## 2024-03-14 DIAGNOSIS — L84 Corns and callosities: Secondary | ICD-10-CM | POA: Diagnosis not present

## 2024-03-14 DIAGNOSIS — B351 Tinea unguium: Secondary | ICD-10-CM | POA: Diagnosis not present

## 2024-03-14 DIAGNOSIS — M21622 Bunionette of left foot: Secondary | ICD-10-CM | POA: Diagnosis not present

## 2024-03-14 NOTE — Progress Notes (Signed)
  Subjective:  Patient ID: Jonathan Griffith, male    DOB: [birth year],  MRN: [id number]  Chief Complaint  Patient presents with   Foot Pain    Left foot tailors bunion painful x 1 month. 8 pain walking. Used lotion to soften. Non diabetic.    Discussed the use of AI scribe software for clinical note transcription with the patient, who gave verbal consent to proceed.  History of Present Illness Jonathan Griffith is an 83 year old male who presents with pain on the side of his left foot.  Pain has been present for about a month, localized to the bottom of the left foot under the fifth metatarsal, with no radiation. The area feels hard, and lotion has been applied to soften it. There are no open wounds or bleeding. He is on Eliquis  for atrial fibrillation and denies diabetes. He enjoys pedicures, though the issue is not specifically addressed during them. He wears boots for yard work and tennis shoes otherwise. Soreness is confirmed on the bottom of the left foot, with no other symptoms noted.  Nails are also thickened and there is difficulty trimming them.  No drainage or open lesions.      Objective:    Physical Exam General: AAO x3, NAD  Dermatological: Hyperkeratotic lesion left foot submetatarsal 5 without any underlying ulceration, drainage or signs of infection. Nails are hypertrophic, dystrophic, brittle, discolored, elongated 10. No surrounding redness or drainage. Tenderness nails 1-5 bilaterally.    Vascular: Dorsalis Pedis artery and Posterior Tibial artery pedal pulses are 2/4 bilateral with immedate capillary fill time.  There is no pain with calf compression, swelling, warmth, erythema.   Neruologic: Grossly intact via light touch bilateral.   Musculoskeletal: Prominent metatarsal head.    No images are attached to the encounter.    Results Radiology: 3 views of the foot were obtained.  There is no evidence of acute fracture.  Calcaneal spurring is present.    Assessment:   1. Prominent metatarsal head, left   2. Callus   3. Dermatophytosis of nail   4. Chronic anticoagulation      Plan:  Patient was evaluated and treated and all questions answered.  Assessment and Plan Assessment & Plan Callus of left foot Callus under the fifth metatarsal due to pressure from bony prominences and lack of fat pad. No complications.. Recurrence likely without pressure relief. On Eliquis , requiring caution for bleeding risk. - Sharply debrided the hyperkeratotic lesion without any complications or bleeding. - Continue lotion to soften callus. - Provide cushioning pads to alleviate pressure. - Advise appropriate footwear to reduce pressure. - Instruct use of cushioning pads during prolonged standing or yard work.  Onychomycosis, on chronic anticoagulation - Sharply debrided nails x 10 without any complications or bleeding   No follow-ups on file.   Donnice JONELLE Fees DPM

## 2024-03-20 NOTE — Progress Notes (Unsigned)
  Electrophysiology Office Note:   ID:  Jonathan Griffith, Jonathan Griffith 1941-03-08, MRN [id number]  Primary Cardiologist: Lonni Cash, MD Electrophysiologist: Danelle Birmingham, MD  {Click to update primary MD,subspecialty MD or APP then REFRESH:1}    History of Present Illness:   Jonathan Griffith is a 83 y.o. male with h/o AF, tachy brady s/p PPM, HTN, HLD, and mod non-obstructive CAD by cath seen today for routine electrophysiology followup.   Since last being seen in our clinic the patient reports doing ***.  he denies chest pain, palpitations, dyspnea, PND, orthopnea, nausea, vomiting, dizziness, syncope, edema, weight gain, or early satiety.   Review of systems complete and found to be negative unless listed in HPI.   EP Information / Studies Reviewed:    EKG is ordered today. Personal review as below.       PPM Interrogation-  reviewed in detail today,  See PACEART report.  Arrhythmia/Device History BIO PPM   Physical Exam:   VS:  There were no vitals taken for this visit.   Wt Readings from Last 3 Encounters:  02/21/24 186 lb (84.4 kg)  10/26/23 194 lb (88 kg)  06/19/23 188 lb 3.2 oz (85.4 kg)     GEN: No acute distress  NECK: No JVD; No carotid bruits CARDIAC: {EPRHYTHM:28826}, no murmurs, rubs, gallops RESPIRATORY:  Clear to auscultation without rales, wheezing or rhonchi  ABDOMEN: Soft, non-tender, non-distended EXTREMITIES:  {EDEMA LEVEL:28147::No} edema; No deformity   ASSESSMENT AND PLAN:    Tachy-Brady syndrome s/p Biotronik PPM  Normal PPM function See Pace Art report No changes today  Permanent AF Continue Eliquis  5 mg BID for CHA2DS2VASc of at least 4 Stable labs 10/2023  HTN Stable on current regimen   CAD No s/s of ischemia.       {Click here to Review PMH, Prob List, Meds, Allergies, SHx, FHx  :1}   Disposition:   Follow up with {EPPROVIDERS:28135::EP Team} {EPFOLLOW UP:28173}  Signed, Ozell Prentice Passey, PA-C

## 2024-03-21 ENCOUNTER — Ambulatory Visit: Attending: Student | Admitting: Student

## 2024-03-21 ENCOUNTER — Encounter: Payer: Self-pay | Admitting: Student

## 2024-03-21 VITALS — BP 130/80 | HR 74 | Ht 65.25 in | Wt 186.0 lb

## 2024-03-21 DIAGNOSIS — I495 Sick sinus syndrome: Secondary | ICD-10-CM | POA: Diagnosis not present

## 2024-03-21 DIAGNOSIS — I10 Essential (primary) hypertension: Secondary | ICD-10-CM | POA: Diagnosis not present

## 2024-03-21 DIAGNOSIS — I25119 Atherosclerotic heart disease of native coronary artery with unspecified angina pectoris: Secondary | ICD-10-CM

## 2024-03-21 DIAGNOSIS — I4821 Permanent atrial fibrillation: Secondary | ICD-10-CM | POA: Diagnosis not present

## 2024-03-21 LAB — CUP PACEART INCLINIC DEVICE CHECK
Date Time Interrogation Session: 20250905091632
Implantable Lead Connection Status: 753985
Implantable Lead Connection Status: 753985
Implantable Lead Implant Date: 20230123
Implantable Lead Implant Date: 20230123
Implantable Lead Location: 753859
Implantable Lead Location: 753860
Implantable Lead Model: 377
Implantable Lead Model: 377
Implantable Lead Serial Number: 7000393970
Implantable Lead Serial Number: 8000680133
Implantable Pulse Generator Implant Date: 20230123
Pulse Gen Model: 407145
Pulse Gen Serial Number: 70294843

## 2024-03-21 NOTE — Patient Instructions (Signed)
 Medication Instructions:  Your physician recommends that you continue on your current medications as directed. Please refer to the Current Medication list given to you today.  *If you need a refill on your cardiac medications before your next appointment, please call your pharmacy*  Lab Work: None ordered If you have labs (blood work) drawn today and your tests are completely normal, you will receive your results only by: MyChart Message (if you have MyChart) OR A paper copy in the mail If you have any lab test that is abnormal or we need to change your treatment, we will call you to review the results.  Follow-Up: At [facility], you and your health needs are our priority.  As part of our continuing mission to provide you with exceptional heart care, our providers are all part of one team.  This team includes your primary Cardiologist (physician) and Advanced Practice Providers or APPs (Physician Assistants and Nurse Practitioners) who all work together to provide you with the care you need, when you need it.  Your next appointment:   18 month(s)  Provider:   An EP MD

## 2024-04-01 DIAGNOSIS — H524 Presbyopia: Secondary | ICD-10-CM | POA: Diagnosis not present

## 2024-04-01 DIAGNOSIS — B0233 Zoster keratitis: Secondary | ICD-10-CM | POA: Diagnosis not present

## 2024-04-01 DIAGNOSIS — H40013 Open angle with borderline findings, low risk, bilateral: Secondary | ICD-10-CM | POA: Diagnosis not present

## 2024-04-01 DIAGNOSIS — H18232 Secondary corneal edema, left eye: Secondary | ICD-10-CM | POA: Diagnosis not present

## 2024-04-01 DIAGNOSIS — H40051 Ocular hypertension, right eye: Secondary | ICD-10-CM | POA: Diagnosis not present

## 2024-04-10 DIAGNOSIS — Z85828 Personal history of other malignant neoplasm of skin: Secondary | ICD-10-CM | POA: Diagnosis not present

## 2024-04-10 DIAGNOSIS — L570 Actinic keratosis: Secondary | ICD-10-CM | POA: Diagnosis not present

## 2024-04-10 DIAGNOSIS — L814 Other melanin hyperpigmentation: Secondary | ICD-10-CM | POA: Diagnosis not present

## 2024-04-10 DIAGNOSIS — L821 Other seborrheic keratosis: Secondary | ICD-10-CM | POA: Diagnosis not present

## 2024-04-10 DIAGNOSIS — L578 Other skin changes due to chronic exposure to nonionizing radiation: Secondary | ICD-10-CM | POA: Diagnosis not present

## 2024-04-11 DIAGNOSIS — B0233 Zoster keratitis: Secondary | ICD-10-CM | POA: Diagnosis not present

## 2024-04-11 DIAGNOSIS — H18232 Secondary corneal edema, left eye: Secondary | ICD-10-CM | POA: Diagnosis not present

## 2024-04-18 NOTE — Progress Notes (Signed)
 Remote PPM Transmission

## 2024-05-06 ENCOUNTER — Ambulatory Visit: Payer: HMO

## 2024-05-06 DIAGNOSIS — I495 Sick sinus syndrome: Secondary | ICD-10-CM

## 2024-05-08 ENCOUNTER — Ambulatory Visit: Payer: Self-pay | Admitting: Internal Medicine

## 2024-05-08 LAB — CUP PACEART REMOTE DEVICE CHECK
Date Time Interrogation Session: 20251021090012
Implantable Lead Connection Status: 753985
Implantable Lead Connection Status: 753985
Implantable Lead Implant Date: 20230123
Implantable Lead Implant Date: 20230123
Implantable Lead Location: 753859
Implantable Lead Location: 753860
Implantable Lead Model: 377
Implantable Lead Model: 377
Implantable Lead Serial Number: 7000393970
Implantable Lead Serial Number: 8000680133
Implantable Pulse Generator Implant Date: 20230123
Pulse Gen Model: 407145
Pulse Gen Serial Number: 70294843

## 2024-05-09 NOTE — Progress Notes (Signed)
 Remote PPM Transmission

## 2024-06-02 ENCOUNTER — Other Ambulatory Visit: Payer: Self-pay

## 2024-06-02 MED ORDER — APIXABAN 5 MG PO TABS: 5.0000 mg | ORAL_TABLET | Freq: Two times a day (BID) | ORAL | 5 refills | Status: AC

## 2024-06-02 NOTE — Telephone Encounter (Signed)
 Copied from CRM #[id number]. Topic: Clinical - Prescription Issue >> Jun 02, 2024 11:23 AM Aleatha BROCKS wrote: Reason for CRM: [facility] has sent over a request for ELIQUIS  5 MG TABS tablet and patient needs it be filled

## 2024-06-16 ENCOUNTER — Encounter (INDEPENDENT_AMBULATORY_CARE_PROVIDER_SITE_OTHER): Admitting: Ophthalmology

## 2024-06-16 DIAGNOSIS — H35033 Hypertensive retinopathy, bilateral: Secondary | ICD-10-CM

## 2024-06-16 DIAGNOSIS — H43813 Vitreous degeneration, bilateral: Secondary | ICD-10-CM

## 2024-06-16 DIAGNOSIS — I10 Essential (primary) hypertension: Secondary | ICD-10-CM

## 2024-06-16 DIAGNOSIS — H33103 Unspecified retinoschisis, bilateral: Secondary | ICD-10-CM

## 2024-06-16 DIAGNOSIS — H35373 Puckering of macula, bilateral: Secondary | ICD-10-CM | POA: Diagnosis not present

## 2024-06-16 DIAGNOSIS — H59031 Cystoid macular edema following cataract surgery, right eye: Secondary | ICD-10-CM | POA: Diagnosis not present

## 2024-06-18 NOTE — Progress Notes (Unsigned)
 No chief complaint on file.  History of Present Illness: 83 yo male with history of CAD, HTN, hyperlipidemia, GERD, atrial fibrillation and sick sinus syndrome s/p pacemaker implant in January 2023 who is here today for cardiac follow up. He is known to have mild to moderate CAD with last cath in March 2018 showing 40% ostial LAD stenosis and mild disease in the RCA and intermediate [place]. LV function was normal. He has been seen in the Pulmonary clinic for workup of dyspnea and is not felt to have significant lung disease. He has described mild chest pain with heavy exertion for years. He did not tolerate Imdur and has not been willing to try Ranexa . Echo 06/28/21 with LVEF=60-65%, no significant valve disease. He was seen in primary care by Dr. Jimmy in January 2022 and was found to have atrial fibrillation. He was started on Eliquis . Cardiac monitor with sinus pause in January 2023. Given near syncope, pacemaker implanted January 2023.   He is here today for follow up. The patient denies any chest pain, dyspnea, palpitations, lower extremity edema, orthopnea, PND, dizziness, near syncope or syncope.   Primary Care Physician: Jimmy Charlie FERNS, MD  Past Medical History:  Diagnosis Date   Allergic rhinitis    Anginal pain    CAD (coronary artery disease)    Chest pain    Duodenal ulcer    Dyspnea    On exertions   ED (erectile dysfunction)    GERD (gastroesophageal reflux disease)    History of colonoscopy    Hyperlipidemia    Hypertension    Osteoarthritis     Past Surgical History:  Procedure Laterality Date   APPENDECTOMY  1964   CARDIAC CATHETERIZATION  03/2010   diffuse early disease   CATARACT EXTRACTION Right 04/18/2016   INGUINAL HERNIA REPAIR Bilateral 1970   double hernia repair    LEFT HEART CATH AND CORONARY ANGIOGRAPHY N/A 10/12/2016   Procedure: Left Heart Cath and Coronary Angiography;  Surgeon: Lonni JONETTA Cash, MD;  Location: [facility] INVASIVE CV LAB;   Service: Cardiovascular;  Laterality: N/A;   PACEMAKER IMPLANT N/A 08/08/2021   Procedure: PACEMAKER IMPLANT;  Surgeon: Waddell Danelle ORN, MD;  Location: [facility] INVASIVE CV LAB;  Service: Cardiovascular;  Laterality: N/A;   RETINAL DETACHMENT SURGERY Right 2003   TONSILLECTOMY     TOTAL KNEE ARTHROPLASTY Right 09/10/2020   Procedure: RIGHT TOTAL KNEE ARTHROPLASTY;  Surgeon: Vernetta Lonni GRADE, MD;  Location: [facility] ORS;  Service: Orthopedics;  Laterality: Right;    Current Outpatient Medications  Medication Sig Dispense Refill   ALPHAGAN  P 0.1 % SOLN Place 1 drop into the right eye in the morning.     apixaban  (ELIQUIS ) 5 MG TABS tablet Take 1 tablet (5 mg total) by mouth 2 (two) times daily. 60 tablet 5   cetirizine  (ZYRTEC ) 10 MG tablet Take 1 tablet (10 mg total) by mouth at bedtime. For allergies 90 tablet 0   Cholecalciferol  (VITAMIN D -3) 125 MCG (5000 UT) TABS Take 5,000 Units by mouth at bedtime.     ILEVRO  0.3 % ophthalmic suspension Place 1 drop into the right eye at bedtime.     losartan  (COZAAR ) 50 MG tablet TAKE 1 TABLET BY MOUTH EVERY DAY 90 tablet 2   magnesium oxide (MAG-OX) 400 MG tablet Take 400 mg by mouth at bedtime.     Multiple Vitamins-Minerals (MULTIVITAMIN WITH MINERALS) tablet Take 1 tablet by mouth at bedtime.     nitroGLYCERIN  (NITROSTAT ) 0.4 MG  SL tablet Place 1 tablet (0.4 mg total) under the tongue every 5 (five) minutes as needed for chest pain (x 3 doses). 25 tablet 3   omeprazole  (PRILOSEC) 20 MG capsule Take 1 capsule (20 mg total) by mouth daily as needed. On an empty stomach 1 capsule 0   prednisoLONE  acetate (PRED FORTE ) 1 % ophthalmic suspension Place 1 drop into the right eye in the morning and at bedtime.     PROLENSA 0.07 % SOLN SMARTSIG:1 Drop(s) Right Eye Every Evening     simvastatin  (ZOCOR ) 40 MG tablet TAKE ONE TABLET BY MOUTH EACH EVENING 90 tablet 3   triamcinolone  cream (KENALOG ) 0.1 % Apply 1 Application topically 2 (two) times daily as needed. 45 g  3   vitamin B-12 (CYANOCOBALAMIN ) 500 MCG tablet Take 500 mcg by mouth in the morning.     No current facility-administered medications for this visit.    Allergies  Allergen Reactions   Ibuprofen     REACTION: ulcer    Social History   Socioeconomic History   Marital status: Married    Spouse name: Not on file   Number of children: 2   Years of education: Not on file   Highest education level: Not on file  Occupational History   Occupation: Haematologist    Comment: retired  Tobacco Use   Smoking status: Former    Current packs/day: 0.00    Average packs/day: 1 pack/day for 10.0 years (10.0 ttl pk-yrs)    Types: Cigars, Cigarettes    Start date: 07/17/1997    Quit date: 07/18/2007    Years since quitting: 16.9    Passive exposure: Past   Smokeless tobacco: Never   Tobacco comments:    Occ cigar - no cigarettes - stopped in 6-09  Vaping Use   Vaping status: Never Used  Substance and Sexual Activity   Alcohol use: Yes    Alcohol/week: 1.0 standard drink of alcohol    Types: 1 Standard drinks or equivalent per week    Comment: 3-4 drinks per week   Drug use: No   Sexual activity: Not on file  Other Topics Concern   Not on file  Social History Narrative   Has living will   Wife would serve as health care POA--then son   Would accept resuscitation attempts but no prolonged artificial ventilation    No tube feeds if cognitively unaware   Social Drivers of Health   Financial Resource Strain: Not on file  Food Insecurity: Not on file  Transportation Needs: Not on file  Physical Activity: Not on file  Stress: Not on file  Social Connections: Not on file  Intimate Partner Violence: Not on file    Family History  Problem Relation Age of Onset   Coronary artery disease Mother        CABG in 84's   Brain cancer Mother         that was metastatic  from lung    Stomach cancer Father 84   Diabetes Sister    Stroke Maternal Grandmother     Review of Systems:  As  stated in the HPI and otherwise negative.   There were no vitals taken for this visit.  Physical Examination: General: Well developed, well nourished, NAD  HEENT: OP clear, mucus membranes moist  SKIN: warm, dry. No rashes. Neuro: No focal deficits  Musculoskeletal: Muscle strength 5/5 all ext  Psychiatric: Mood and affect normal  Neck: No JVD, no carotid bruits,  no thyromegaly, no lymphadenopathy.  Lungs:Clear bilaterally, no wheezes, rhonci, crackles Cardiovascular: Regular rate and rhythm. No murmurs, gallops or rubs. Abdomen:Soft. Bowel sounds present. Non-tender.  Extremities: No lower extremity edema. Pulses are 2 + in the bilateral DP/PT.  EKG:  EKG is *** ordered today. The ekg ordered today demonstrates   Recent Labs: 10/26/2023: ALT 11; BUN 21; Creatinine, Ser 1.26; Hemoglobin 16.2; Platelets 236.0; Potassium 4.8; Sodium 139   Lipid Panel    Component Value Date/Time   CHOL 132 10/26/2023 0803   TRIG 167.0 (H) 10/26/2023 0803   HDL 46.10 10/26/2023 0803   CHOLHDL 3 10/26/2023 0803   VLDL 33.4 10/26/2023 0803   LDLCALC 53 10/26/2023 0803   LDLDIRECT 131.3 04/06/2009 0922     Wt Readings from Last 3 Encounters:  03/21/24 186 lb (84.4 kg)  02/21/24 186 lb (84.4 kg)  10/26/23 194 lb (88 kg)    Assessment and Plan:   1. CAD with stable angina:  No change in chronic chest pain. His angina is felt to be due to microvascular disease. Cardiac cath in March 2018 with stable mild CAD. He never tried Ranexa . He did not tolerate Imdur. He is off of ASA since he is now on Eliquis .  -Continue statin  2. HTN: BP is controlled.  -Continue Losartan   3. Hyperlipidemia: Lipids followed in primary care. LDL ***  -Continue Zocor .     4. Persistent atrial fibrillation: He remains in atrial fib today. He has no symptoms.  -Continue Eliquis   5. Sick sinus syndrome: Pacemaker in place. Followed by Dr. Waddell.   Labs/ tests ordered today include:   No orders of the defined  types were placed in this encounter.  Disposition:   F/U with me in 12 months.    Signed, Lonni Cash, MD 06/18/2024 10:43 AM    [facility] [address] [place], [place], [place]  [address] Phone: [phone]; Fax: [phone]

## 2024-06-19 ENCOUNTER — Ambulatory Visit: Attending: Cardiovascular Disease | Admitting: Cardiovascular Disease

## 2024-06-19 VITALS — BP 122/80 | HR 66 | Ht 66.0 in | Wt 192.0 lb

## 2024-06-19 DIAGNOSIS — I495 Sick sinus syndrome: Secondary | ICD-10-CM

## 2024-06-19 DIAGNOSIS — I4821 Permanent atrial fibrillation: Secondary | ICD-10-CM

## 2024-06-19 DIAGNOSIS — I10 Essential (primary) hypertension: Secondary | ICD-10-CM | POA: Diagnosis not present

## 2024-06-19 DIAGNOSIS — I25119 Atherosclerotic heart disease of native coronary artery with unspecified angina pectoris: Secondary | ICD-10-CM | POA: Diagnosis not present

## 2024-06-19 DIAGNOSIS — E7800 Pure hypercholesterolemia, unspecified: Secondary | ICD-10-CM

## 2024-06-19 NOTE — Patient Instructions (Signed)

## 2024-08-05 ENCOUNTER — Ambulatory Visit: Payer: HMO

## 2024-08-05 DIAGNOSIS — I495 Sick sinus syndrome: Secondary | ICD-10-CM

## 2024-08-06 LAB — CUP PACEART REMOTE DEVICE CHECK
Battery Voltage: 80
Date Time Interrogation Session: 20260120093902
Implantable Lead Connection Status: 753985
Implantable Lead Connection Status: 753985
Implantable Lead Implant Date: 20230123
Implantable Lead Implant Date: 20230123
Implantable Lead Location: 753859
Implantable Lead Location: 753860
Implantable Lead Model: 377
Implantable Lead Model: 377
Implantable Lead Serial Number: 7000393970
Implantable Lead Serial Number: 8000680133
Implantable Pulse Generator Implant Date: 20230123
Pulse Gen Model: 407145
Pulse Gen Serial Number: 70294843

## 2024-08-08 NOTE — Progress Notes (Signed)
 Remote PPM Transmission

## 2024-08-12 ENCOUNTER — Ambulatory Visit: Payer: Self-pay | Admitting: Cardiovascular Disease

## 2024-10-28 ENCOUNTER — Encounter

## 2025-01-05 ENCOUNTER — Encounter (INDEPENDENT_AMBULATORY_CARE_PROVIDER_SITE_OTHER): Admitting: Ophthalmology
